# Patient Record
Sex: Female | Born: 1985
Health system: Southern US, Community
[De-identification: ages and names within clinical notes are randomized; demographics above are authoritative.]

## PROBLEM LIST (undated history)

## (undated) ENCOUNTER — Inpatient Hospital Stay (HOSPITAL_COMMUNITY): Payer: Self-pay

## (undated) DIAGNOSIS — D649 Anemia, unspecified: Secondary | ICD-10-CM

## (undated) DIAGNOSIS — M92219 Osteochondrosis (juvenile) of carpal lunate [Kienbock], unspecified hand: Secondary | ICD-10-CM

## (undated) DIAGNOSIS — J309 Allergic rhinitis, unspecified: Secondary | ICD-10-CM

## (undated) DIAGNOSIS — R519 Headache, unspecified: Secondary | ICD-10-CM

## (undated) DIAGNOSIS — K219 Gastro-esophageal reflux disease without esophagitis: Secondary | ICD-10-CM

## (undated) DIAGNOSIS — G8929 Other chronic pain: Secondary | ICD-10-CM

## (undated) DIAGNOSIS — J449 Chronic obstructive pulmonary disease, unspecified: Secondary | ICD-10-CM

## (undated) DIAGNOSIS — M549 Dorsalgia, unspecified: Secondary | ICD-10-CM

## (undated) DIAGNOSIS — I10 Essential (primary) hypertension: Secondary | ICD-10-CM

## (undated) DIAGNOSIS — R51 Headache: Secondary | ICD-10-CM

## (undated) DIAGNOSIS — A599 Trichomoniasis, unspecified: Secondary | ICD-10-CM

## (undated) DIAGNOSIS — G43909 Migraine, unspecified, not intractable, without status migrainosus: Secondary | ICD-10-CM

## (undated) HISTORY — PX: HERNIA REPAIR: SHX51

## (undated) HISTORY — DX: Gastro-esophageal reflux disease without esophagitis: K21.9

## (undated) HISTORY — DX: Morbid (severe) obesity due to excess calories: E66.01

## (undated) HISTORY — DX: Headache, unspecified: R51.9

## (undated) HISTORY — PX: TUBAL LIGATION: SHX77

## (undated) HISTORY — PX: OTHER SURGICAL HISTORY: SHX169

## (undated) HISTORY — DX: Headache: R51

## (undated) HISTORY — PX: CHOLECYSTECTOMY: SHX55

## (undated) HISTORY — PX: DILATION AND CURETTAGE OF UTERUS: SHX78

## (undated) HISTORY — PX: TOOTH EXTRACTION: SUR596

## (undated) HISTORY — DX: Allergic rhinitis, unspecified: J30.9

---
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
## 2003-01-30 ENCOUNTER — Inpatient Hospital Stay (HOSPITAL_COMMUNITY): Admission: AD | Admit: 2003-01-30 | Discharge: 2003-01-30 | Payer: Self-pay | Admitting: Obstetrics & Gynecology

## 2003-01-31 ENCOUNTER — Ambulatory Visit (HOSPITAL_COMMUNITY): Admission: RE | Admit: 2003-01-31 | Discharge: 2003-01-31 | Payer: Self-pay | Admitting: Obstetrics & Gynecology

## 2003-01-31 ENCOUNTER — Encounter: Payer: Self-pay | Admitting: Obstetrics & Gynecology

## 2003-02-02 ENCOUNTER — Inpatient Hospital Stay (HOSPITAL_COMMUNITY): Admission: AD | Admit: 2003-02-02 | Discharge: 2003-02-02 | Payer: Self-pay | Admitting: Obstetrics & Gynecology

## 2003-02-06 ENCOUNTER — Inpatient Hospital Stay (HOSPITAL_COMMUNITY): Admission: AD | Admit: 2003-02-06 | Discharge: 2003-02-09 | Payer: Self-pay | Admitting: Obstetrics & Gynecology

## 2003-02-06 ENCOUNTER — Encounter (INDEPENDENT_AMBULATORY_CARE_PROVIDER_SITE_OTHER): Payer: Self-pay | Admitting: *Deleted

## 2003-02-12 ENCOUNTER — Inpatient Hospital Stay (HOSPITAL_COMMUNITY): Admission: AD | Admit: 2003-02-12 | Discharge: 2003-02-12 | Payer: Self-pay | Admitting: Obstetrics and Gynecology

## 2003-03-02 ENCOUNTER — Emergency Department (HOSPITAL_COMMUNITY): Admission: EM | Admit: 2003-03-02 | Discharge: 2003-03-03 | Payer: Self-pay | Admitting: Emergency Medicine

## 2003-03-03 ENCOUNTER — Encounter: Payer: Self-pay | Admitting: Emergency Medicine

## 2003-03-11 ENCOUNTER — Emergency Department (HOSPITAL_COMMUNITY): Admission: EM | Admit: 2003-03-11 | Discharge: 2003-03-11 | Payer: Self-pay | Admitting: Emergency Medicine

## 2003-06-21 ENCOUNTER — Emergency Department (HOSPITAL_COMMUNITY): Admission: EM | Admit: 2003-06-21 | Discharge: 2003-06-21 | Payer: Self-pay | Admitting: Emergency Medicine

## 2003-09-24 ENCOUNTER — Emergency Department (HOSPITAL_COMMUNITY): Admission: EM | Admit: 2003-09-24 | Discharge: 2003-09-25 | Payer: Self-pay | Admitting: Emergency Medicine

## 2003-12-30 ENCOUNTER — Emergency Department (HOSPITAL_COMMUNITY): Admission: EM | Admit: 2003-12-30 | Discharge: 2003-12-30 | Payer: Self-pay | Admitting: Emergency Medicine

## 2004-01-03 ENCOUNTER — Encounter (INDEPENDENT_AMBULATORY_CARE_PROVIDER_SITE_OTHER): Payer: Self-pay | Admitting: Specialist

## 2004-01-03 ENCOUNTER — Observation Stay (HOSPITAL_COMMUNITY): Admission: RE | Admit: 2004-01-03 | Discharge: 2004-01-04 | Payer: Self-pay | Admitting: Surgery

## 2004-02-01 ENCOUNTER — Emergency Department (HOSPITAL_COMMUNITY): Admission: EM | Admit: 2004-02-01 | Discharge: 2004-02-01 | Payer: Self-pay | Admitting: Emergency Medicine

## 2004-02-08 ENCOUNTER — Ambulatory Visit (HOSPITAL_COMMUNITY): Admission: RE | Admit: 2004-02-08 | Discharge: 2004-02-08 | Payer: Self-pay | Admitting: *Deleted

## 2004-03-06 ENCOUNTER — Ambulatory Visit (HOSPITAL_COMMUNITY): Admission: RE | Admit: 2004-03-06 | Discharge: 2004-03-06 | Payer: Self-pay | Admitting: *Deleted

## 2004-03-13 ENCOUNTER — Inpatient Hospital Stay (HOSPITAL_COMMUNITY): Admission: AD | Admit: 2004-03-13 | Discharge: 2004-03-13 | Payer: Self-pay | Admitting: *Deleted

## 2004-03-14 ENCOUNTER — Inpatient Hospital Stay (HOSPITAL_COMMUNITY): Admission: AD | Admit: 2004-03-14 | Discharge: 2004-03-15 | Payer: Self-pay | Admitting: *Deleted

## 2004-03-15 ENCOUNTER — Ambulatory Visit: Payer: Self-pay | Admitting: *Deleted

## 2004-03-15 ENCOUNTER — Encounter (INDEPENDENT_AMBULATORY_CARE_PROVIDER_SITE_OTHER): Payer: Self-pay | Admitting: *Deleted

## 2004-03-15 ENCOUNTER — Ambulatory Visit (HOSPITAL_COMMUNITY): Admission: AD | Admit: 2004-03-15 | Discharge: 2004-03-15 | Payer: Self-pay | Admitting: *Deleted

## 2004-06-18 ENCOUNTER — Ambulatory Visit (HOSPITAL_COMMUNITY): Admission: RE | Admit: 2004-06-18 | Discharge: 2004-06-18 | Payer: Self-pay | Admitting: *Deleted

## 2004-08-01 ENCOUNTER — Inpatient Hospital Stay (HOSPITAL_COMMUNITY): Admission: AD | Admit: 2004-08-01 | Discharge: 2004-08-01 | Payer: Self-pay | Admitting: Obstetrics & Gynecology

## 2004-08-07 ENCOUNTER — Ambulatory Visit (HOSPITAL_COMMUNITY): Admission: RE | Admit: 2004-08-07 | Discharge: 2004-08-07 | Payer: Self-pay | Admitting: *Deleted

## 2004-09-23 ENCOUNTER — Ambulatory Visit (HOSPITAL_COMMUNITY): Admission: RE | Admit: 2004-09-23 | Discharge: 2004-09-23 | Payer: Self-pay | Admitting: *Deleted

## 2004-10-19 ENCOUNTER — Inpatient Hospital Stay (HOSPITAL_COMMUNITY): Admission: AD | Admit: 2004-10-19 | Discharge: 2004-10-19 | Payer: Self-pay | Admitting: *Deleted

## 2004-11-07 ENCOUNTER — Ambulatory Visit (HOSPITAL_COMMUNITY): Admission: RE | Admit: 2004-11-07 | Discharge: 2004-11-07 | Payer: Self-pay | Admitting: *Deleted

## 2004-11-14 ENCOUNTER — Inpatient Hospital Stay (HOSPITAL_COMMUNITY): Admission: AD | Admit: 2004-11-14 | Discharge: 2004-11-14 | Payer: Self-pay | Admitting: Family Medicine

## 2004-11-28 ENCOUNTER — Inpatient Hospital Stay (HOSPITAL_COMMUNITY): Admission: AD | Admit: 2004-11-28 | Discharge: 2004-11-28 | Payer: Self-pay | Admitting: Obstetrics & Gynecology

## 2004-12-16 ENCOUNTER — Inpatient Hospital Stay (HOSPITAL_COMMUNITY): Admission: AD | Admit: 2004-12-16 | Discharge: 2004-12-16 | Payer: Self-pay | Admitting: Obstetrics & Gynecology

## 2004-12-20 ENCOUNTER — Ambulatory Visit: Payer: Self-pay | Admitting: Obstetrics and Gynecology

## 2004-12-20 ENCOUNTER — Inpatient Hospital Stay (HOSPITAL_COMMUNITY): Admission: AD | Admit: 2004-12-20 | Discharge: 2004-12-20 | Payer: Self-pay | Admitting: Obstetrics & Gynecology

## 2004-12-24 ENCOUNTER — Inpatient Hospital Stay (HOSPITAL_COMMUNITY): Admission: AD | Admit: 2004-12-24 | Discharge: 2004-12-24 | Payer: Self-pay | Admitting: *Deleted

## 2004-12-31 ENCOUNTER — Encounter (INDEPENDENT_AMBULATORY_CARE_PROVIDER_SITE_OTHER): Payer: Self-pay | Admitting: *Deleted

## 2004-12-31 ENCOUNTER — Inpatient Hospital Stay (HOSPITAL_COMMUNITY): Admission: RE | Admit: 2004-12-31 | Discharge: 2005-01-03 | Payer: Self-pay | Admitting: *Deleted

## 2004-12-31 ENCOUNTER — Ambulatory Visit: Payer: Self-pay | Admitting: *Deleted

## 2005-01-06 ENCOUNTER — Ambulatory Visit: Payer: Self-pay | Admitting: Obstetrics & Gynecology

## 2005-02-16 ENCOUNTER — Emergency Department (HOSPITAL_COMMUNITY): Admission: EM | Admit: 2005-02-16 | Discharge: 2005-02-16 | Payer: Self-pay | Admitting: Emergency Medicine

## 2005-02-20 ENCOUNTER — Ambulatory Visit (HOSPITAL_COMMUNITY): Admission: RE | Admit: 2005-02-20 | Discharge: 2005-02-20 | Payer: Self-pay | Admitting: Neurology

## 2005-03-01 ENCOUNTER — Emergency Department (HOSPITAL_COMMUNITY): Admission: EM | Admit: 2005-03-01 | Discharge: 2005-03-02 | Payer: Self-pay | Admitting: Emergency Medicine

## 2005-03-17 ENCOUNTER — Emergency Department (HOSPITAL_COMMUNITY): Admission: EM | Admit: 2005-03-17 | Discharge: 2005-03-18 | Payer: Self-pay | Admitting: Emergency Medicine

## 2005-04-02 ENCOUNTER — Emergency Department (HOSPITAL_COMMUNITY): Admission: EM | Admit: 2005-04-02 | Discharge: 2005-04-02 | Payer: Self-pay | Admitting: Emergency Medicine

## 2005-04-21 ENCOUNTER — Emergency Department (HOSPITAL_COMMUNITY): Admission: EM | Admit: 2005-04-21 | Discharge: 2005-04-22 | Payer: Self-pay | Admitting: Emergency Medicine

## 2005-05-12 ENCOUNTER — Inpatient Hospital Stay (HOSPITAL_COMMUNITY): Admission: AD | Admit: 2005-05-12 | Discharge: 2005-05-13 | Payer: Self-pay | Admitting: Obstetrics & Gynecology

## 2005-05-21 ENCOUNTER — Emergency Department (HOSPITAL_COMMUNITY): Admission: EM | Admit: 2005-05-21 | Discharge: 2005-05-22 | Payer: Self-pay | Admitting: Emergency Medicine

## 2005-07-18 ENCOUNTER — Emergency Department (HOSPITAL_COMMUNITY): Admission: EM | Admit: 2005-07-18 | Discharge: 2005-07-18 | Payer: Self-pay | Admitting: Emergency Medicine

## 2005-09-26 ENCOUNTER — Emergency Department (HOSPITAL_COMMUNITY): Admission: EM | Admit: 2005-09-26 | Discharge: 2005-09-26 | Payer: Self-pay | Admitting: Emergency Medicine

## 2005-09-28 ENCOUNTER — Emergency Department (HOSPITAL_COMMUNITY): Admission: EM | Admit: 2005-09-28 | Discharge: 2005-09-28 | Payer: Self-pay | Admitting: Emergency Medicine

## 2005-10-18 ENCOUNTER — Emergency Department (HOSPITAL_COMMUNITY): Admission: EM | Admit: 2005-10-18 | Discharge: 2005-10-19 | Payer: Self-pay | Admitting: Emergency Medicine

## 2005-11-06 ENCOUNTER — Inpatient Hospital Stay (HOSPITAL_COMMUNITY): Admission: AD | Admit: 2005-11-06 | Discharge: 2005-11-07 | Payer: Self-pay | Admitting: Family Medicine

## 2005-11-07 ENCOUNTER — Inpatient Hospital Stay (HOSPITAL_COMMUNITY): Admission: AD | Admit: 2005-11-07 | Discharge: 2005-11-07 | Payer: Self-pay | Admitting: Obstetrics & Gynecology

## 2005-11-30 ENCOUNTER — Emergency Department (HOSPITAL_COMMUNITY): Admission: EM | Admit: 2005-11-30 | Discharge: 2005-11-30 | Payer: Self-pay | Admitting: Emergency Medicine

## 2005-12-04 ENCOUNTER — Encounter: Admission: RE | Admit: 2005-12-04 | Discharge: 2005-12-04 | Payer: Self-pay | Admitting: Gastroenterology

## 2005-12-19 ENCOUNTER — Emergency Department (HOSPITAL_COMMUNITY): Admission: EM | Admit: 2005-12-19 | Discharge: 2005-12-19 | Payer: Self-pay | Admitting: Emergency Medicine

## 2005-12-22 ENCOUNTER — Encounter: Admission: RE | Admit: 2005-12-22 | Discharge: 2005-12-22 | Payer: Self-pay | Admitting: Gastroenterology

## 2006-02-11 ENCOUNTER — Emergency Department (HOSPITAL_COMMUNITY): Admission: EM | Admit: 2006-02-11 | Discharge: 2006-02-12 | Payer: Self-pay | Admitting: Emergency Medicine

## 2006-03-09 ENCOUNTER — Emergency Department (HOSPITAL_COMMUNITY): Admission: EM | Admit: 2006-03-09 | Discharge: 2006-03-10 | Payer: Self-pay | Admitting: Emergency Medicine

## 2006-03-15 ENCOUNTER — Inpatient Hospital Stay (HOSPITAL_COMMUNITY): Admission: AD | Admit: 2006-03-15 | Discharge: 2006-03-15 | Payer: Self-pay | Admitting: Obstetrics and Gynecology

## 2006-03-26 ENCOUNTER — Encounter: Payer: Self-pay | Admitting: Obstetrics and Gynecology

## 2006-03-26 ENCOUNTER — Ambulatory Visit: Payer: Self-pay | Admitting: Obstetrics and Gynecology

## 2006-04-02 ENCOUNTER — Ambulatory Visit: Payer: Self-pay | Admitting: Obstetrics and Gynecology

## 2006-04-02 ENCOUNTER — Ambulatory Visit (HOSPITAL_COMMUNITY): Admission: RE | Admit: 2006-04-02 | Discharge: 2006-04-02 | Payer: Self-pay | Admitting: Obstetrics and Gynecology

## 2006-04-06 ENCOUNTER — Inpatient Hospital Stay (HOSPITAL_COMMUNITY): Admission: AD | Admit: 2006-04-06 | Discharge: 2006-04-06 | Payer: Self-pay | Admitting: Gynecology

## 2006-04-15 ENCOUNTER — Emergency Department (HOSPITAL_COMMUNITY): Admission: EM | Admit: 2006-04-15 | Discharge: 2006-04-16 | Payer: Self-pay | Admitting: Emergency Medicine

## 2006-04-22 ENCOUNTER — Ambulatory Visit: Payer: Self-pay | Admitting: Obstetrics and Gynecology

## 2006-05-31 ENCOUNTER — Emergency Department (HOSPITAL_COMMUNITY): Admission: EM | Admit: 2006-05-31 | Discharge: 2006-05-31 | Payer: Self-pay | Admitting: Emergency Medicine

## 2006-06-08 ENCOUNTER — Emergency Department (HOSPITAL_COMMUNITY): Admission: EM | Admit: 2006-06-08 | Discharge: 2006-06-09 | Payer: Self-pay | Admitting: Emergency Medicine

## 2006-06-13 ENCOUNTER — Inpatient Hospital Stay (HOSPITAL_COMMUNITY): Admission: AD | Admit: 2006-06-13 | Discharge: 2006-06-13 | Payer: Self-pay | Admitting: Obstetrics & Gynecology

## 2006-06-16 ENCOUNTER — Inpatient Hospital Stay (HOSPITAL_COMMUNITY): Admission: AD | Admit: 2006-06-16 | Discharge: 2006-06-16 | Payer: Self-pay | Admitting: Family Medicine

## 2006-06-24 ENCOUNTER — Inpatient Hospital Stay (HOSPITAL_COMMUNITY): Admission: RE | Admit: 2006-06-24 | Discharge: 2006-06-24 | Payer: Self-pay | Admitting: Family Medicine

## 2006-07-15 ENCOUNTER — Inpatient Hospital Stay (HOSPITAL_COMMUNITY): Admission: AD | Admit: 2006-07-15 | Discharge: 2006-07-16 | Payer: Self-pay | Admitting: Gynecology

## 2006-07-19 ENCOUNTER — Emergency Department (HOSPITAL_COMMUNITY): Admission: EM | Admit: 2006-07-19 | Discharge: 2006-07-20 | Payer: Self-pay | Admitting: Emergency Medicine

## 2006-07-22 ENCOUNTER — Emergency Department (HOSPITAL_COMMUNITY): Admission: EM | Admit: 2006-07-22 | Discharge: 2006-07-23 | Payer: Self-pay | Admitting: Emergency Medicine

## 2006-07-24 ENCOUNTER — Inpatient Hospital Stay (HOSPITAL_COMMUNITY): Admission: AD | Admit: 2006-07-24 | Discharge: 2006-07-24 | Payer: Self-pay | Admitting: Gynecology

## 2006-08-12 ENCOUNTER — Inpatient Hospital Stay (HOSPITAL_COMMUNITY): Admission: AD | Admit: 2006-08-12 | Discharge: 2006-08-13 | Payer: Self-pay | Admitting: Gynecology

## 2006-08-25 ENCOUNTER — Inpatient Hospital Stay (HOSPITAL_COMMUNITY): Admission: AD | Admit: 2006-08-25 | Discharge: 2006-08-26 | Payer: Self-pay | Admitting: Obstetrics & Gynecology

## 2006-08-30 ENCOUNTER — Emergency Department (HOSPITAL_COMMUNITY): Admission: EM | Admit: 2006-08-30 | Discharge: 2006-08-31 | Payer: Self-pay | Admitting: Emergency Medicine

## 2006-09-10 ENCOUNTER — Ambulatory Visit (HOSPITAL_COMMUNITY): Admission: RE | Admit: 2006-09-10 | Discharge: 2006-09-10 | Payer: Self-pay | Admitting: Obstetrics & Gynecology

## 2006-09-21 ENCOUNTER — Ambulatory Visit: Payer: Self-pay | Admitting: Obstetrics & Gynecology

## 2006-09-24 ENCOUNTER — Ambulatory Visit (HOSPITAL_COMMUNITY): Admission: RE | Admit: 2006-09-24 | Discharge: 2006-09-24 | Payer: Self-pay | Admitting: Obstetrics & Gynecology

## 2006-09-28 ENCOUNTER — Inpatient Hospital Stay (HOSPITAL_COMMUNITY): Admission: AD | Admit: 2006-09-28 | Discharge: 2006-09-28 | Payer: Self-pay | Admitting: Obstetrics & Gynecology

## 2006-09-28 ENCOUNTER — Ambulatory Visit: Payer: Self-pay | Admitting: Obstetrics & Gynecology

## 2006-09-28 ENCOUNTER — Ambulatory Visit: Payer: Self-pay | Admitting: Obstetrics and Gynecology

## 2006-10-01 ENCOUNTER — Ambulatory Visit: Payer: Self-pay | Admitting: Obstetrics & Gynecology

## 2006-10-02 ENCOUNTER — Ambulatory Visit: Payer: Self-pay | Admitting: Obstetrics & Gynecology

## 2006-10-02 ENCOUNTER — Observation Stay (HOSPITAL_COMMUNITY): Admission: AD | Admit: 2006-10-02 | Discharge: 2006-10-03 | Payer: Self-pay | Admitting: Obstetrics & Gynecology

## 2006-10-05 ENCOUNTER — Ambulatory Visit: Payer: Self-pay | Admitting: Obstetrics & Gynecology

## 2006-10-12 ENCOUNTER — Encounter: Admission: RE | Admit: 2006-10-12 | Discharge: 2007-01-10 | Payer: Self-pay | Admitting: Obstetrics & Gynecology

## 2006-10-12 ENCOUNTER — Ambulatory Visit: Payer: Self-pay | Admitting: *Deleted

## 2006-10-16 ENCOUNTER — Ambulatory Visit: Payer: Self-pay | Admitting: *Deleted

## 2006-10-16 ENCOUNTER — Inpatient Hospital Stay (HOSPITAL_COMMUNITY): Admission: AD | Admit: 2006-10-16 | Discharge: 2006-10-16 | Payer: Self-pay | Admitting: Obstetrics and Gynecology

## 2006-10-19 ENCOUNTER — Ambulatory Visit: Payer: Self-pay | Admitting: Obstetrics & Gynecology

## 2006-10-22 ENCOUNTER — Ambulatory Visit: Payer: Self-pay | Admitting: Family Medicine

## 2006-10-22 ENCOUNTER — Ambulatory Visit (HOSPITAL_COMMUNITY): Admission: RE | Admit: 2006-10-22 | Discharge: 2006-10-22 | Payer: Self-pay | Admitting: Obstetrics & Gynecology

## 2006-10-26 ENCOUNTER — Ambulatory Visit: Payer: Self-pay | Admitting: Obstetrics & Gynecology

## 2006-10-28 ENCOUNTER — Ambulatory Visit: Payer: Self-pay | Admitting: Gynecology

## 2006-11-02 ENCOUNTER — Ambulatory Visit: Payer: Self-pay | Admitting: Obstetrics & Gynecology

## 2006-11-05 ENCOUNTER — Ambulatory Visit: Payer: Self-pay | Admitting: Family Medicine

## 2006-11-16 ENCOUNTER — Ambulatory Visit: Payer: Self-pay | Admitting: Obstetrics & Gynecology

## 2006-11-23 ENCOUNTER — Ambulatory Visit: Payer: Self-pay | Admitting: Obstetrics & Gynecology

## 2006-11-30 ENCOUNTER — Ambulatory Visit (HOSPITAL_COMMUNITY): Admission: RE | Admit: 2006-11-30 | Discharge: 2006-11-30 | Payer: Self-pay | Admitting: Obstetrics & Gynecology

## 2006-11-30 ENCOUNTER — Ambulatory Visit: Payer: Self-pay | Admitting: *Deleted

## 2006-12-09 ENCOUNTER — Ambulatory Visit: Payer: Self-pay | Admitting: Obstetrics & Gynecology

## 2006-12-16 ENCOUNTER — Inpatient Hospital Stay (HOSPITAL_COMMUNITY): Admission: AD | Admit: 2006-12-16 | Discharge: 2006-12-17 | Payer: Self-pay | Admitting: Obstetrics and Gynecology

## 2006-12-16 ENCOUNTER — Ambulatory Visit: Payer: Self-pay | Admitting: Obstetrics and Gynecology

## 2006-12-30 ENCOUNTER — Ambulatory Visit (HOSPITAL_COMMUNITY): Admission: RE | Admit: 2006-12-30 | Discharge: 2006-12-30 | Payer: Self-pay | Admitting: Obstetrics & Gynecology

## 2007-01-04 ENCOUNTER — Ambulatory Visit: Payer: Self-pay | Admitting: Family Medicine

## 2007-01-05 ENCOUNTER — Ambulatory Visit: Payer: Self-pay | Admitting: Physician Assistant

## 2007-01-05 ENCOUNTER — Inpatient Hospital Stay (HOSPITAL_COMMUNITY): Admission: AD | Admit: 2007-01-05 | Discharge: 2007-01-06 | Payer: Self-pay | Admitting: Family Medicine

## 2007-01-08 ENCOUNTER — Ambulatory Visit (HOSPITAL_COMMUNITY): Admission: RE | Admit: 2007-01-08 | Discharge: 2007-01-08 | Payer: Self-pay | Admitting: Obstetrics & Gynecology

## 2007-01-10 ENCOUNTER — Inpatient Hospital Stay (HOSPITAL_COMMUNITY): Admission: AD | Admit: 2007-01-10 | Discharge: 2007-01-10 | Payer: Self-pay | Admitting: Obstetrics and Gynecology

## 2007-01-10 ENCOUNTER — Ambulatory Visit: Payer: Self-pay | Admitting: Obstetrics and Gynecology

## 2007-01-11 ENCOUNTER — Ambulatory Visit: Payer: Self-pay | Admitting: Obstetrics & Gynecology

## 2007-01-14 ENCOUNTER — Ambulatory Visit: Payer: Self-pay | Admitting: Gynecology

## 2007-01-14 ENCOUNTER — Inpatient Hospital Stay (HOSPITAL_COMMUNITY): Admission: AD | Admit: 2007-01-14 | Discharge: 2007-01-14 | Payer: Self-pay | Admitting: Gynecology

## 2007-01-14 ENCOUNTER — Encounter: Payer: Self-pay | Admitting: *Deleted

## 2007-01-14 ENCOUNTER — Ambulatory Visit: Payer: Self-pay | Admitting: Obstetrics and Gynecology

## 2007-01-18 ENCOUNTER — Ambulatory Visit: Payer: Self-pay | Admitting: Gynecology

## 2007-01-18 ENCOUNTER — Inpatient Hospital Stay (HOSPITAL_COMMUNITY): Admission: AD | Admit: 2007-01-18 | Discharge: 2007-01-20 | Payer: Self-pay | Admitting: Obstetrics & Gynecology

## 2007-01-18 ENCOUNTER — Encounter (INDEPENDENT_AMBULATORY_CARE_PROVIDER_SITE_OTHER): Payer: Self-pay | Admitting: Gynecology

## 2007-01-24 ENCOUNTER — Ambulatory Visit: Payer: Self-pay | Admitting: Obstetrics and Gynecology

## 2007-01-24 ENCOUNTER — Inpatient Hospital Stay (HOSPITAL_COMMUNITY): Admission: AD | Admit: 2007-01-24 | Discharge: 2007-01-24 | Payer: Self-pay | Admitting: Obstetrics and Gynecology

## 2007-01-25 ENCOUNTER — Ambulatory Visit: Payer: Self-pay | Admitting: Physician Assistant

## 2007-01-25 ENCOUNTER — Inpatient Hospital Stay (HOSPITAL_COMMUNITY): Admission: AD | Admit: 2007-01-25 | Discharge: 2007-01-26 | Payer: Self-pay | Admitting: Obstetrics and Gynecology

## 2007-01-27 ENCOUNTER — Ambulatory Visit: Payer: Self-pay | Admitting: Obstetrics & Gynecology

## 2007-01-31 ENCOUNTER — Inpatient Hospital Stay (HOSPITAL_COMMUNITY): Admission: AD | Admit: 2007-01-31 | Discharge: 2007-02-01 | Payer: Self-pay | Admitting: Obstetrics & Gynecology

## 2007-01-31 ENCOUNTER — Ambulatory Visit: Payer: Self-pay | Admitting: *Deleted

## 2007-02-05 ENCOUNTER — Inpatient Hospital Stay (HOSPITAL_COMMUNITY): Admission: AD | Admit: 2007-02-05 | Discharge: 2007-02-05 | Payer: Self-pay | Admitting: Family Medicine

## 2007-02-10 ENCOUNTER — Ambulatory Visit: Payer: Self-pay | Admitting: Gynecology

## 2007-02-12 ENCOUNTER — Ambulatory Visit (HOSPITAL_COMMUNITY): Admission: RE | Admit: 2007-02-12 | Discharge: 2007-02-12 | Payer: Self-pay | Admitting: Gynecology

## 2007-04-17 ENCOUNTER — Emergency Department (HOSPITAL_COMMUNITY): Admission: EM | Admit: 2007-04-17 | Discharge: 2007-04-17 | Payer: Self-pay | Admitting: Emergency Medicine

## 2007-05-08 ENCOUNTER — Emergency Department (HOSPITAL_COMMUNITY): Admission: EM | Admit: 2007-05-08 | Discharge: 2007-05-08 | Payer: Self-pay | Admitting: Emergency Medicine

## 2007-06-23 ENCOUNTER — Emergency Department (HOSPITAL_COMMUNITY): Admission: EM | Admit: 2007-06-23 | Discharge: 2007-06-23 | Payer: Self-pay | Admitting: Emergency Medicine

## 2007-07-10 ENCOUNTER — Inpatient Hospital Stay (HOSPITAL_COMMUNITY): Admission: AD | Admit: 2007-07-10 | Discharge: 2007-07-11 | Payer: Self-pay | Admitting: Obstetrics and Gynecology

## 2007-07-11 ENCOUNTER — Emergency Department (HOSPITAL_COMMUNITY): Admission: EM | Admit: 2007-07-11 | Discharge: 2007-07-12 | Payer: Self-pay | Admitting: Emergency Medicine

## 2007-07-15 HISTORY — PX: WRIST SURGERY: SHX841

## 2007-07-25 ENCOUNTER — Emergency Department (HOSPITAL_COMMUNITY): Admission: EM | Admit: 2007-07-25 | Discharge: 2007-07-25 | Payer: Self-pay | Admitting: Emergency Medicine

## 2007-08-11 ENCOUNTER — Emergency Department (HOSPITAL_COMMUNITY): Admission: EM | Admit: 2007-08-11 | Discharge: 2007-08-11 | Payer: Self-pay | Admitting: Emergency Medicine

## 2007-08-13 ENCOUNTER — Emergency Department (HOSPITAL_COMMUNITY): Admission: EM | Admit: 2007-08-13 | Discharge: 2007-08-13 | Payer: Self-pay | Admitting: Emergency Medicine

## 2007-08-19 ENCOUNTER — Emergency Department (HOSPITAL_COMMUNITY): Admission: EM | Admit: 2007-08-19 | Discharge: 2007-08-20 | Payer: Self-pay | Admitting: Emergency Medicine

## 2007-08-29 ENCOUNTER — Emergency Department (HOSPITAL_COMMUNITY): Admission: EM | Admit: 2007-08-29 | Discharge: 2007-08-30 | Payer: Self-pay | Admitting: Emergency Medicine

## 2007-09-01 ENCOUNTER — Emergency Department (HOSPITAL_COMMUNITY): Admission: EM | Admit: 2007-09-01 | Discharge: 2007-09-01 | Payer: Self-pay | Admitting: Emergency Medicine

## 2007-09-21 ENCOUNTER — Emergency Department (HOSPITAL_COMMUNITY): Admission: EM | Admit: 2007-09-21 | Discharge: 2007-09-21 | Payer: Self-pay | Admitting: Emergency Medicine

## 2007-09-27 ENCOUNTER — Emergency Department (HOSPITAL_COMMUNITY): Admission: EM | Admit: 2007-09-27 | Discharge: 2007-09-27 | Payer: Self-pay | Admitting: Emergency Medicine

## 2007-10-13 ENCOUNTER — Inpatient Hospital Stay (HOSPITAL_COMMUNITY): Admission: AD | Admit: 2007-10-13 | Discharge: 2007-10-13 | Payer: Self-pay | Admitting: Obstetrics & Gynecology

## 2007-10-30 ENCOUNTER — Emergency Department (HOSPITAL_COMMUNITY): Admission: EM | Admit: 2007-10-30 | Discharge: 2007-10-30 | Payer: Self-pay | Admitting: Emergency Medicine

## 2008-02-13 ENCOUNTER — Inpatient Hospital Stay (HOSPITAL_COMMUNITY): Admission: AD | Admit: 2008-02-13 | Discharge: 2008-02-13 | Payer: Self-pay | Admitting: Obstetrics and Gynecology

## 2008-02-15 ENCOUNTER — Inpatient Hospital Stay (HOSPITAL_COMMUNITY): Admission: AD | Admit: 2008-02-15 | Discharge: 2008-02-15 | Payer: Self-pay | Admitting: Obstetrics & Gynecology

## 2008-03-04 ENCOUNTER — Emergency Department (HOSPITAL_COMMUNITY): Admission: EM | Admit: 2008-03-04 | Discharge: 2008-03-04 | Payer: Self-pay | Admitting: Emergency Medicine

## 2008-03-16 ENCOUNTER — Emergency Department (HOSPITAL_COMMUNITY): Admission: EM | Admit: 2008-03-16 | Discharge: 2008-03-16 | Payer: Self-pay | Admitting: Emergency Medicine

## 2008-08-21 ENCOUNTER — Emergency Department (HOSPITAL_COMMUNITY): Admission: EM | Admit: 2008-08-21 | Discharge: 2008-08-21 | Payer: Self-pay | Admitting: Emergency Medicine

## 2008-09-07 ENCOUNTER — Emergency Department (HOSPITAL_COMMUNITY): Admission: EM | Admit: 2008-09-07 | Discharge: 2008-09-08 | Payer: Self-pay | Admitting: Emergency Medicine

## 2008-09-15 ENCOUNTER — Emergency Department (HOSPITAL_COMMUNITY): Admission: EM | Admit: 2008-09-15 | Discharge: 2008-09-16 | Payer: Self-pay | Admitting: Emergency Medicine

## 2008-09-21 ENCOUNTER — Emergency Department (HOSPITAL_COMMUNITY): Admission: EM | Admit: 2008-09-21 | Discharge: 2008-09-21 | Payer: Self-pay | Admitting: Emergency Medicine

## 2008-09-29 ENCOUNTER — Emergency Department (HOSPITAL_COMMUNITY): Admission: EM | Admit: 2008-09-29 | Discharge: 2008-09-29 | Payer: Self-pay | Admitting: Pediatrics

## 2009-01-04 ENCOUNTER — Inpatient Hospital Stay (HOSPITAL_COMMUNITY): Admission: AD | Admit: 2009-01-04 | Discharge: 2009-01-04 | Payer: Self-pay | Admitting: Obstetrics & Gynecology

## 2009-03-20 ENCOUNTER — Encounter: Admission: RE | Admit: 2009-03-20 | Discharge: 2009-04-27 | Payer: Self-pay | Admitting: Orthopedic Surgery

## 2009-06-19 ENCOUNTER — Emergency Department (HOSPITAL_COMMUNITY): Admission: EM | Admit: 2009-06-19 | Discharge: 2009-06-19 | Payer: Self-pay | Admitting: Emergency Medicine

## 2009-07-11 ENCOUNTER — Emergency Department (HOSPITAL_COMMUNITY): Admission: EM | Admit: 2009-07-11 | Discharge: 2009-07-12 | Payer: Self-pay | Admitting: Emergency Medicine

## 2009-07-12 ENCOUNTER — Emergency Department (HOSPITAL_COMMUNITY): Admission: EM | Admit: 2009-07-12 | Discharge: 2009-07-13 | Payer: Self-pay | Admitting: Emergency Medicine

## 2009-07-23 ENCOUNTER — Ambulatory Visit (HOSPITAL_COMMUNITY): Admission: RE | Admit: 2009-07-23 | Discharge: 2009-07-25 | Payer: Self-pay | Admitting: General Surgery

## 2009-08-27 ENCOUNTER — Encounter: Admission: RE | Admit: 2009-08-27 | Discharge: 2009-08-27 | Payer: Self-pay | Admitting: General Surgery

## 2009-09-10 ENCOUNTER — Encounter: Admission: RE | Admit: 2009-09-10 | Discharge: 2009-09-10 | Payer: Self-pay | Admitting: General Surgery

## 2009-09-22 ENCOUNTER — Ambulatory Visit: Payer: Self-pay | Admitting: Diagnostic Radiology

## 2009-09-22 ENCOUNTER — Emergency Department (HOSPITAL_BASED_OUTPATIENT_CLINIC_OR_DEPARTMENT_OTHER): Admission: EM | Admit: 2009-09-22 | Discharge: 2009-09-22 | Payer: Self-pay | Admitting: Emergency Medicine

## 2009-09-22 ENCOUNTER — Emergency Department (HOSPITAL_COMMUNITY): Admission: EM | Admit: 2009-09-22 | Discharge: 2009-09-22 | Payer: Self-pay | Admitting: Emergency Medicine

## 2009-10-05 ENCOUNTER — Emergency Department (HOSPITAL_COMMUNITY): Admission: EM | Admit: 2009-10-05 | Discharge: 2009-10-05 | Payer: Self-pay | Admitting: Emergency Medicine

## 2009-11-07 ENCOUNTER — Emergency Department (HOSPITAL_COMMUNITY): Admission: EM | Admit: 2009-11-07 | Discharge: 2009-11-07 | Payer: Self-pay | Admitting: Family Medicine

## 2009-11-10 ENCOUNTER — Emergency Department (HOSPITAL_COMMUNITY): Admission: EM | Admit: 2009-11-10 | Discharge: 2009-11-11 | Payer: Self-pay | Admitting: Emergency Medicine

## 2009-11-10 ENCOUNTER — Emergency Department (HOSPITAL_COMMUNITY): Admission: EM | Admit: 2009-11-10 | Discharge: 2009-11-10 | Payer: Self-pay | Admitting: Emergency Medicine

## 2009-12-26 ENCOUNTER — Encounter: Admission: RE | Admit: 2009-12-26 | Discharge: 2009-12-26 | Payer: Self-pay | Admitting: Sports Medicine

## 2010-02-07 ENCOUNTER — Emergency Department (HOSPITAL_COMMUNITY): Admission: EM | Admit: 2010-02-07 | Discharge: 2010-02-07 | Payer: Self-pay | Admitting: Family Medicine

## 2010-06-20 ENCOUNTER — Emergency Department (HOSPITAL_COMMUNITY): Admission: EM | Admit: 2010-06-20 | Discharge: 2009-10-21 | Payer: Self-pay | Admitting: Emergency Medicine

## 2010-08-04 ENCOUNTER — Encounter: Payer: Self-pay | Admitting: Sports Medicine

## 2010-08-04 ENCOUNTER — Encounter: Payer: Self-pay | Admitting: *Deleted

## 2010-08-04 ENCOUNTER — Encounter: Payer: Self-pay | Admitting: Gastroenterology

## 2010-08-04 ENCOUNTER — Encounter: Payer: Self-pay | Admitting: Neurology

## 2010-09-29 LAB — APTT: aPTT: 27 seconds (ref 24–37)

## 2010-09-29 LAB — CBC
Hemoglobin: 11.7 g/dL — ABNORMAL LOW (ref 12.0–15.0)
MCHC: 33.7 g/dL (ref 30.0–36.0)
RDW: 12.8 % (ref 11.5–15.5)

## 2010-10-14 LAB — CBC
HCT: 45.5 % (ref 36.0–46.0)
Hemoglobin: 16 g/dL — ABNORMAL HIGH (ref 12.0–15.0)
MCHC: 34.9 g/dL (ref 30.0–36.0)
Platelets: 162 10*3/uL (ref 150–400)
RDW: 12.3 % (ref 11.5–15.5)
WBC: 10.6 10*3/uL — ABNORMAL HIGH (ref 4.0–10.5)

## 2010-10-14 LAB — DIFFERENTIAL
Basophils Absolute: 0.1 10*3/uL (ref 0.0–0.1)
Basophils Relative: 1 % (ref 0–1)
Eosinophils Relative: 3 % (ref 0–5)
Lymphocytes Relative: 28 % (ref 12–46)
Lymphs Abs: 3 10*3/uL (ref 0.7–4.0)
Neutro Abs: 5.6 10*3/uL (ref 1.7–7.7)
Neutro Abs: 6.7 10*3/uL (ref 1.7–7.7)
Neutrophils Relative %: 54 % (ref 43–77)

## 2010-10-14 LAB — BASIC METABOLIC PANEL
BUN: 5 mg/dL — ABNORMAL LOW (ref 6–23)
CO2: 26 mEq/L (ref 19–32)
Calcium: 9.1 mg/dL (ref 8.4–10.5)
Creatinine, Ser: 0.75 mg/dL (ref 0.4–1.2)
GFR calc non Af Amer: >60 mL/min (ref >60)
Glucose, Bld: 99 mg/dL (ref 70–99)
Potassium: 3.5 mEq/L (ref 3.5–5.1)
Sodium: 136 mEq/L (ref 135–145)

## 2010-10-14 LAB — URINALYSIS, ROUTINE W REFLEX MICROSCOPIC
Bilirubin Urine: NEGATIVE
Hgb urine dipstick: NEGATIVE
Protein, ur: NEGATIVE mg/dL
Urobilinogen, UA: 0.2 mg/dL (ref 0.0–1.0)

## 2010-10-14 LAB — PROTIME-INR: Prothrombin Time: 13.4 seconds (ref 11.6–15.2)

## 2010-10-15 LAB — POCT I-STAT, CHEM 8
Chloride: 106 mEq/L (ref 96–112)
Glucose, Bld: 98 mg/dL (ref 70–99)
HCT: 43 % (ref 36.0–46.0)
Potassium: 3.9 mEq/L (ref 3.5–5.1)
Sodium: 139 mEq/L (ref 135–145)

## 2010-10-21 LAB — WET PREP, GENITAL
Clue Cells Wet Prep HPF POC: NONE SEEN
Trich, Wet Prep: NONE SEEN
Yeast Wet Prep HPF POC: NONE SEEN

## 2010-10-21 LAB — DIFFERENTIAL
Eosinophils Relative: 4 % (ref 0–5)
Lymphocytes Relative: 26 % (ref 12–46)
Lymphs Abs: 2.5 10*3/uL (ref 0.7–4.0)
Monocytes Absolute: 0.4 10*3/uL (ref 0.1–1.0)
Monocytes Relative: 4 % (ref 3–12)
Neutro Abs: 6.2 10*3/uL (ref 1.7–7.7)

## 2010-10-21 LAB — URINALYSIS, ROUTINE W REFLEX MICROSCOPIC
Ketones, ur: NEGATIVE mg/dL
Leukocytes, UA: NEGATIVE
Protein, ur: NEGATIVE mg/dL
Urobilinogen, UA: 0.2 mg/dL (ref 0.0–1.0)

## 2010-10-21 LAB — CBC
HCT: 41.7 % (ref 36.0–46.0)
Hemoglobin: 14.7 g/dL (ref 12.0–15.0)
RBC: 4.68 MIL/uL (ref 3.87–5.11)
RDW: 12.4 % (ref 11.5–15.5)
WBC: 9.6 10*3/uL (ref 4.0–10.5)

## 2010-10-21 LAB — POCT PREGNANCY, URINE: Preg Test, Ur: NEGATIVE

## 2010-10-21 LAB — URINE MICROSCOPIC-ADD ON

## 2010-10-31 ENCOUNTER — Inpatient Hospital Stay (HOSPITAL_COMMUNITY): Payer: Medicaid Other

## 2010-10-31 ENCOUNTER — Inpatient Hospital Stay (HOSPITAL_COMMUNITY)
Admission: AD | Admit: 2010-10-31 | Discharge: 2010-10-31 | Disposition: A | Discharge status: Home / Self Care | Payer: Medicaid Other | Source: Ambulatory Visit | Attending: Obstetrics & Gynecology | Admitting: Obstetrics & Gynecology

## 2010-10-31 DIAGNOSIS — R109 Unspecified abdominal pain: Secondary | ICD-10-CM | POA: Insufficient documentation

## 2010-10-31 LAB — URINALYSIS, ROUTINE W REFLEX MICROSCOPIC
Bilirubin Urine: NEGATIVE
Hgb urine dipstick: NEGATIVE
Nitrite: NEGATIVE
Specific Gravity, Urine: 1.015 (ref 1.005–1.030)
Urobilinogen, UA: 0.2 mg/dL (ref 0.0–1.0)
pH: 6 (ref 5.0–8.0)

## 2010-10-31 LAB — WET PREP, GENITAL: Clue Cells Wet Prep HPF POC: NONE SEEN

## 2010-10-31 LAB — POCT PREGNANCY, URINE: Preg Test, Ur: NEGATIVE

## 2010-11-01 LAB — GC/CHLAMYDIA PROBE AMP, GENITAL: Chlamydia, DNA Probe: NEGATIVE

## 2010-11-26 NOTE — Op Note (Signed)
NAME:  [name], HAREWOOD NO.:  [id number]   MEDICAL RECORD NO.:  [id number]          PATIENT TYPE:  INP   LOCATION:                                [facility]  [facility]   PHYSICIAN:  Ginger Carne, MD  DATE OF BIRTH:  1986/06/05   DATE OF PROCEDURE:  01/18/2007  DATE OF DISCHARGE:                               OPERATIVE REPORT   PREOPERATIVE DIAGNOSIS:  37-week gestation, uncontrolled A2 diabetic, [redacted]  weeks gestation and sterilization request, previous cesarean section.   POSTOPERATIVE DIAGNOSIS:  37-week gestation, uncontrolled A2 diabetic,  [redacted] weeks gestation and sterilization request, previous cesarean section,  preterm viable delivery of female infant.   PROCEDURE:  Repeat low transverse cesarean section and Pomeroy bilateral  tubal ligation.   SURGEON:  Ginger Carne, MD   ASSISTANT:  Dr. Okey Dupre.   ESTIMATED BLOOD LOSS:  500 mL.   COMPLICATIONS:  None immediate.   ANESTHESIA:  Spinal.   SPECIMEN:  Portion of the right and left tubes sent separately and cord  bloods.   OPERATIVE FINDINGS:  There is a term infant female delivered in vertex  presentation.  Apgar and weight per delivery room record, no gross  abnormalities.  Baby cried spontaneously at delivery.  Placenta was  complete, three-vessel cord central insertion.  Uterus, tubes and  ovaries showed normal decidual changes of pregnancy.  Amniotic fluid was  clear, non foul-smelling.   PROCEDURE:  The patient prepped and draped in usual fashion and placed  in left lateral supine position.  Betadine solution used for antiseptic  and the patient was catheterized prior to procedure.  After adequate  spinal analgesia a low vertical infraumbilical incision was made and the  abdomen opened.  Bladder flap dissected and lower uterine segment  incised transversely.  Baby delivered, cord clamped and cut and infant  given to the pediatric staff after bulb suctioning.  Placenta removed  manually.  Uterus  inspected.  Closure of the uterine musculature in one  layers of Vicryl running interlocking suture.  Bilateral Pomeroy,  bilateral tubal ligation performed by grasping the isthmus ampullary  junction of both tubes.  2-3 cm of tube were incorporated and 2-0 plain  catgut ties utilized twice.  The tubes were cut above said knots and  cauterized, no active bleeding noted.  Specimens sent separately to  pathology.  Following this, no bleeding noted.  Bleeding points  hemostatically checked.  Blood clots removed.  Closure of the  fascia in one layer with double loop zero PDS running suture and skin  staples for the skin.  Instrument, and sponge count were correct.  The  patient tolerated the procedure well, returned post anesthesia recovery  room in excellent condition.      Ginger Carne, MD  Electronically Signed     SHB/MEDQ  D:  01/18/2007  T:  01/18/2007  Job:  [id number]

## 2010-11-26 NOTE — Discharge Summary (Signed)
NAME:  [name], [name]      ACCOUNT NO.:  [id number]   MEDICAL RECORD NO.:  [id number]          PATIENT TYPE:  INP   LOCATION:  9374                          [facility]ACILITY:  [facility]   PHYSICIAN:  Phil D. Okey Dupre, M.D.     DATE OF BIRTH:  Oct 29, 1985   DATE OF ADMISSION:  12/16/2006  DATE OF DISCHARGE:  12/17/2006                               DISCHARGE SUMMARY   DISCHARGE DIAGNOSES:  1. Asthma exacerbation.  2. Viral pneumonia.  3. Tobacco dependence.  4. Gestational diabetes type A2.  5. Migraine headaches.   PROCEDURES:  1. Fetal echocardiogram.  2. Chest x-ray showed viral pneumonia.  3. A complete OB ultrasound was performed.   CONSULTS:  Maternal fetal medicine was consulted as well as Dr. Mayer Camel  with pediatric cardiology.   DISCHARGE MEDICATIONS:  1. Albuterol 1 to 2 puffs inhaled every 4 hours as needed for      shortness of breath.  2. Insulin regimen as follows:  26 units of regular insulin and 48      units of NPH in the morning with breakfast, followed by 20 units of      regular insulin with dinner and 24 units of NPH insulin at bedtime.  3. Azithromycin 500 mg by mouth daily.  4. Prenatal vitamins 1 tablet by mouth daily.  5. Glyburide 2.5 mg by mouth each night.   FOLLOWUP INSTRUCTIONS:  The patient is leaving the hospital against  medical advice. The risks of this had been discussed with the patient  including fetal death. She reports that she has an appointment with her  obstetrician doctor tomorrow, and plans to go to that clinic if she can  get a ride.   HOSPITAL COURSE:  1. This is a 25 year old female who is gravida 6, para 3-0-2-3 who      presented to triage with an intrauterine pregnancy at 32 weeks and      3 days. She reported fever, chills and cough as well as shortness      of breath. Her respiratory rate was 32, and she was saturating 97%      on room air. She reports that she has a history of asthma but has      not used her albuterol inhaler for  over 3 years. She also reports      that she smokes 3 packs per day. A chest x-ray was obtained and      showed a pattern that was consistent with viral versus multilobar      pneumonia. She was started on azithromycin as well as albuterol and      Atrovent nebulizers. She was initially started on Solu-Medrol 60 mg      IV every 6 hours and then transitioned to oral prednisone 40 mg      once daily. At the time of her discharge, she is tolerating      albuterol nebulizers every 6 hours and does not have any wheezes or      any increased respiratory effort. She did receive a prescription      for albuterol to use as needed for shortness of breath. She  was      highly encouraged to quit or cut back on smoking cigarettes.  2. Migraines. The patient reported a history of migraine headaches and      had a headache on admission. She received Tylenol as needed for      pain, and her headache quickly resolved without any complication.  3. Diabetes. The patient was on Glyburide and insulin prior to      admission. Her 2-hour postprandial glucose readings were as      follows:  120 and 151. We had one fasting result which was 150;      however, she had not truly fasted for a complete 8 hours when this      was obtained.  4. Fetal bradycardia. The main reason for wishing to keep the patient      hospitalized was in order to fully evaluate the patient's fetal      bradycardia. It was noted that the baby's heart rate was difficult      to trace and would sometimes drop into the 90s. Dr. Margot Ables with      maternal fetal medicine was consulted for this reason and evaluated      the patient. He recommended that pediatric cardiology evaluate the      patient. Dr. Mayer Camel did see the patient on the day of discharge and      performed a fetal echocardiogram. The preliminary report is that      the echocardiogram was completely within normal limits without      evidence for heart block or structural  abnormality. Dr. Mayer Camel did      not require any further workup or evaluation in his consult note.      The concern for the fetal bradycardia was thoroughly discussed with      the patient prior to discharge. She, however, opted to leave the      hospital against medical advice due to childcare concerns.     ______________________________  Sylvan Cheese, M.D.      Phil D. Okey Dupre, M.D.  Electronically Signed    MJ/MEDQ  D:  12/17/2006  T:  12/18/2006  Job:  [id number]   cc:   Margot Ables, M.D.  [facility]   [facility]  [facility]

## 2010-11-26 NOTE — Discharge Summary (Signed)
NAME:  [name], [name] NO.:  [id number]   MEDICAL RECORD NO.:  [id number]          PATIENT TYPE:  INP   LOCATION:                                [facility]   PHYSICIAN:  Ginger Carne, MD  DATE OF BIRTH:  12-01-1985   DATE OF ADMISSION:  01/18/2007  DATE OF DISCHARGE:  01/20/2007                               DISCHARGE SUMMARY   ADMITTING DIAGNOSIS:  37-week gestation, uncontrolled A2 gestational  diabetes and scheduled repeat cesarean section.   DISCHARGE DIAGNOSES:  Repeat cesarean section.   SERVICE:  OB Teaching Service   ATTENDING PHYSICIAN:  Ginger Carne, M.D.   RESIDENT:  Eustaquio Boyden, M.D.   HISTORY:  Patient is a 25 year old G6, P3-1-2-4, with history of  gestational diabetes, type A2, who is admitted for a repeat low vertical  cesarean section, bilateral tubal ligation at [redacted] weeks gestation.  Patient received prenatal care at [facility].  Cesarean section done on January 18, 2007, at 8:30 a.m.  Operation went  without complications under spinal anesthesia, surgeon Dr. Mia Creek.  After bilateral tubal ligation, right and left portions of the tubes  were sent to pathology.  Estimated blood loss less than 500 mL.  A  viable female was delivered, vertex, with Apgars of 8 and 9 at one  minute and five minutes, respectively.  Weight of baby was 7 pounds 11  ounces.   Patient plans to breast and bottle feed.   Contraception status:  Post bilateral tubal ligation.   Patient was given one dose of RhoGAM in hospital because patient's blood  type was B-negative and baby's blood type was B-positive.  RPR negative.  Rubella immune.  HIV nonreactive.  Gonorrhea and Chlamydia screen  negative.  During hospital stay, the patient's blood sugars were well  controlled with insulin.  Rest of hospital course unremarkable.   Postoperative hemoglobin 11.6, postoperative hematocrit 34 on January 19, 2007.   DISCHARGE INFORMATION:   Patient to be discharged on July 9 in the  afternoon.  Activity - no heavy lifting for six weeks.  Diet routine.   DISCHARGE MEDICATIONS:  1. Percocet 5/325 mg one to two p.o. q. 4 hours p.r.n. pain.  2. Motrin 600 mg p.o. one q. 6 hours p.r.n. cramping.  3. Colace 100 mg p.o. one b.i.d. p.r.n. constipation.  4. Prenatal vitamin one p.o. daily.  5. Flexeril 10 mg one p.o. three times a day.  6. other medications as per previous instructions   PATIENT STATUS:  Well.   ROUTINE INSTRUCTIONS:  Baby will be discharged to home with mom  to  follow up in six weeks at [facility] and to  follow up and schedule appointment as soon as possible with back pain  doctor.  Baby Love to come out to patient's house on postoperative day  #5 to #7 to remove staples and for wound followup.      Eustaquio Boyden, MD      Ginger Carne, MD  Electronically Signed    JG/MEDQ  D:  01/20/2007  T:  01/20/2007  Job:  [id number]

## 2010-11-29 NOTE — Op Note (Signed)
NAME:  [name], [name]                       ACCOUNT NO.:  [id number]   MEDICAL RECORD NO.:  [id number]                   PATIENT TYPE:  OBV   LOCATION:  0350                                 FACILITY:  [facility]   PHYSICIAN:  Currie Paris, M.D.           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   POSTOPERATIVE DIAGNOSES:  Chronic calculous cholecystitis with recent  episode of biliary colic.   OPERATION:  Laparoscopic cholecystectomy.   SURGEON:  Currie Paris, M.D.   ASSISTANT:  Vikki Ports, M.D.   ANESTHESIA:  General endotracheal.   HISTORY:  This patient is a 25 year old who presented with somewhat cryptic  abdominal pain to the emergency room on Saturday.  Workup showed right  ovarian cyst but also what appeared to be acute cholecystitis and she indeed  was tender in the right upper quadrant and has been having problems with  biliary type symptoms in the past especially diarrhea with eating anything  greasy or fried. After discussion with the patient, she elected to proceed  to cholecystectomy.   DESCRIPTION OF PROCEDURE:  The patient was seen in the holding and had no  further questions.  She was taken to the operating room and after  satisfactory general endotracheal anesthesia had been obtained, the abdomen  was prepped and draped.  0.25% Marcaine was used for each incision.   The skin was opened and the peritoneal cavity entered at the umbilicus  initially and a pursestring placed. The Hasson was introduced and the  abdomen insufflated to 15.  There were a few omental adhesions to the lower  midline. There was a right ovarian cyst which had evidence of recent rupture  and may have been an ovulation cyst.  The left side was not visualized.  The  gallbladder was a little bit contracted and thick walled.   The patient was placed in reverse Trendelenburg and tilted to the left.  Under direct vision, a 10/11 trocar was placed in the epigastrium and two  5's laterally.  The gallbladder was retracted over the liver and some  omental adhesions taken down. The peritoneum over the cystic duct was opened  and cystic duct dissected out for a nice length.  This was a fairly long  gallbladder and I could not really get a lot of mobility to dissect out the  artery initially but once I had the anatomy fairly clear, I clipped the  cystic duct and opened it. A Cook catheter was placed percutaneously and  threaded in and held with a clip and operative cholangiography done.  This  showed a nice cystic duct, good filling of the common duct and hepatic  radicles and nice emptying of the duodenum with no evidence  of filling  defects.   The catheter was removed and two clips were placed on the cystic duct and it  was divided.  The cystic artery was identified, triple clipped leaving two  on the stay side and it was divided.  The gallbladder was removed from below  to above with coagulation current of the current. Just prior to  disconnecting, we irrigated and made sure everything was dry.   The gallbladder was brought out the umbilical port. We reinsufflated and  made a final check for hemostasis and again everything was dry.  The lateral  ports were removed under direct vision and there was no bleeding.  The  umbilical port was closed with a pursestring. The abdomen was deflated  through the epigastric port.  The skin was closed with 4-0 Monocryl  subcuticular and Dermabond.   The patient tolerated the procedure well. There were no operative  complications.  All counts were correct.                                               Currie Paris, M.D.    CJS/MEDQ  D:  01/03/2004  T:  01/03/2004  Job:  [id number]

## 2010-11-29 NOTE — Op Note (Signed)
NAME:  [name], [name]      ACCOUNT NO.:  [id number]   MEDICAL RECORD NO.:  [id number]          PATIENT TYPE:  AMB   LOCATION:  SDC                           [facility]ILITY:  [facility]   PHYSICIAN:  Phil D. Okey Dupre, M.D.     DATE OF BIRTH:  26-Jan-1986   DATE OF PROCEDURE:  04/02/2006  DATE OF DISCHARGE:  04/02/2006                                 OPERATIVE REPORT   PROCEDURE:  Diagnostic laparoscopy with lysis of pelvic adhesions.   PREOPERATIVE DIAGNOSIS:  Chronic lower abdominal pain.   POSTOPERATIVE DIAGNOSIS:  Normal female pelvis with some filmy adhesions.   SURGEON:  Javier Glazier. Okey Dupre, M.D.   ANESTHESIA:  General.   SPECIMENS TO PATHOLOGY:  None.   POSTOPERATIVE CONDITION:  Satisfactory.   INDICATIONS FOR PROCEDURE:  The patient is a 25 year old gravida 5, para 3-0-  2-3, with a history of three cesarean sections who has had one month of  severe lower abdominal pain which was tender to abdominal touch without  guarding or rebound.  It extended from just below her umbilicus to the  suprapubic area.  Before surgery today, when talking to the patient, she  that recently it had radiated down into her thighs, both front and back, and  into the lower back.   PROCEDURE IN DETAIL:  Under satisfactory general anesthesia, with the  patient in the dorsal lithotomy position, the perineum, vagina, and abdomen  were prepped and draped in the usual sterile manner.  Bimanual pelvic  examination under anesthesia revealed the uterus normal size, shape, and  consistency, the adnexa could not be well outlined.  A weighted speculum was  placed into the vagina, the anterior lip of the cervix grasped with a single  tooth tenaculum.  An intrauterine probe was attached to the cervix for  mobilization of the uterus.  A 1 cm vertical incision was made just below  the umbilicus.  A Veress needle was attempted to be inserted into the  peritoneal cavity.  We could not get a low enough pressure.  When this was  done, I tried to do this in the cul-de-sac with the same response, so I  decided to go in under direct vision and went through the incision below the  umbilicus, opened the peritoneal cavity, and put the direct vision trocar  in, removed the sleeve, inserted carbon dioxide until the abdomen became  distended to some degree.  The laparoscope inserted and the pelvic organs  were found to be completely normal with the exception of some filmy  adhesions from the anterior abdominal wall to the uterus secondary to the  previous cesarean section.  These were cut up with the small scissors.  There was no other sign of endometriosis, inflammation, or any thing else to  explain this patient's problem.  The scope was removed from the sleeve, CO2  was allowed to express through the sleeve, the sleeve then removed, and the  incision closed with a 3-0 Vicryl suture  closing the fascia which was run up through a subcuticular suture for wound  closure.  The patient will be told of the problem and I would recommend that  she see a Midwife.  I think possibly her problem is due to lumbar disc  pain rather than gynecological, I could see no gynecological reason for this  pain.           ______________________________  Javier Glazier. Okey Dupre, M.D.     PDR/MEDQ  D:  04/02/2006  T:  04/04/2006  Job:  [id number]

## 2010-11-29 NOTE — Op Note (Signed)
[name], [name]                         ACCOUNT NO.:  [id number]   MEDICAL RECORD NO.:  [id number]                   PATIENT TYPE:  INP   LOCATION:  9198                                 [facility]ACILITY:  [facility]   PHYSICIAN:  Clement Husbands, M.D.         DATE OF BIRTH:  1986-02-23   DATE OF PROCEDURE:  02/06/2003  DATE OF DISCHARGE:                                 OPERATIVE REPORT   PREOPERATIVE DIAGNOSES:  1. Term pregnancy.  2. Previous low transverse cervical cesarean section.   POSTOPERATIVE DIAGNOSES:  1. Term pregnancy.  2. Previous low transverse cervical cesarean section.   OPERATION:  Repeat low transverse cervical cesarean section.   SURGEON:  Burnadette Peter, M.D.   ASSISTANT:  Nursing staff.   ANESTHESIA:  Spinal.   DESCRIPTION OF PROCEDURE:  With the patient under satisfactory spinal  anesthesia in the supine position and slightly tilted to the left, the  urethra was cleansed with Betadine and a Foley catheter inserted.  The  abdomen was then prepped and draped.  Her large panniculus had been taped  so that it was lifted up toward the top of the table.  A low abdominal  transverse skin incision was made in her old scar and carried down through  an extremely thick subcutaneous tissue layer.  An awful lot of scarring was  noted.  Both sharp knife and cutting cautery were used.  Finally the fascia  was identified.  It was transversely incised and then divided out laterally  in both directions.  The recti muscles were divided in the midline  inferiorly.  There was as lot of scarring of subfascial adipose tissue  adherent to the peritoneum.  Through the upper part of the peritoneum the  abdominal cavity was entered.  The peritoneum was vertically divided and  inferiorly divided.  The bladder blade was positioned.  There was an awful  lot of scarring then of the vesicouterine peritoneum.  This was incised and  pushed inferiorly, the bladder blade repositioned.   A lower segment  transverse uterine incision was then made into the uterine cavity and then  bluntly pulled laterally in each direction.  Amniotic fluid was clear.  Vertex presentation was noted.  The vertex was lifted into the incision.  A  soft cup vacuum was applied and the vertex easily delivered.  The  nasopharynx was suctioned.  Spontaneous respirations and crying were noted.  The nuchal cord was divided and the infant passed off to the awaiting  neonatal team.  A segment of cord blood was clamped and passed off the table  so the pH and cord bloods could be obtained.  The placenta was markedly  adherent in the fundal portion of the uterus and required manual removal to  get it out.  The uterus was then explored and was felt to be smooth on the  inside.  The internal os was dilated.  Intravenous Pitocin drip  was started.  Intravenous Ancef was given.   The uterine incision was then closed with a running locking 0 Vicryl suture.  A second layer imbricated the first with good hemostasis.  The vesicouterine  peritoneum was reapproximated with a running 2-0 Vicryl suture.  An attempt  was made to close the abdominal peritoneum but because of all the scarring  and adipose tissue there, it was left open.  Recti muscles, however, were  put together with interrupted 0 Vicryl sutures.  The rectus fascia was then  closed with running 0 Vicryl suture beginning laterally on each side and  taken to the midline, where it was tied separately.  The subcutaneous tissue  layer was then irrigated and hemostasis was good.  Interrupted 2-0 Vicryl  suture was used on the subcutaneous tissue to decrease the dead space.  Skin edges were then approximated with wide skin staples.  Estimated blood  loss was about 800 mL.  Sponge and needle count was correct.  She tolerated  the procedure well and was returned to the recovery room in satisfactory  condition.   She was delivered of a female infant weighing 8  pounds 6 ounces.  The Apgar  was 9 and 9.  Arterial cord pH was 7.30.                                               Clement Husbands, M.D.    EFR/MEDQ  D:  02/06/2003  T:  02/06/2003  Job:  [id number]

## 2010-11-29 NOTE — Group Therapy Note (Signed)
NAME:  [name], [name] NO.:  [id number]   MEDICAL RECORD NO.:  [id number]          PATIENT TYPE:  WOC   LOCATION:  [facility]                   FACILITY:  [facility]   PHYSICIAN:  Argentina Donovan, MD        DATE OF BIRTH:  1986-03-06   DATE OF SERVICE:                                    CLINIC NOTE   The patient is a 25 year old, gravida 5, para 3-0-2-3 with a history of 3  cesarean sections and cholecystectomy, who presented at [facility]  at the end of August with severe lower abdominal pain, nausea, and diarrhea.  She was seen there and treated with Flagyl for a vaginal infection. Told to  go to the MAU if she did not get better. The patient did not get better and  presented herself at the MAU on March 15, 2006. At that time she was  complaining of the pain for the last three weeks progressively getting  worse. She had been treated for cervicitis and BV, she stated, and said she  had a fever that went as high as 103 the previous week. She had (and  continues to have) nausea but rarely, but diarrhea comes after she eats. She  was given Vicodin at [facility] that did not help, but the Percocet seemed  to. She has been very careful with not wanting to become dependent on it.   PAST MEDICAL HISTORY:  Three C-sections, two spontaneous abortions,  gallbladder surgery. She also has a history of migraines and bronchial  asthma. She has had some low back pain since her last delivery a year ago.  When she presented at MAU, she had a Mirena IUD which was removed. She was  treated as PID because of an elevated white count of 15,000 with Rocephin  and Zithromax and placed on doxycycline for a week. She had a CAT scan of  the lower abdomen which was normal. The appendix was seen and also was  normal. The patient over the past week and a half has not gotten any better.  Her nausea has abated, but she denies frequency or dysuria at the present  time. She cannot work because  any tight clothes increase her pain.   PHYSICAL EXAMINATION:  ABDOMEN:  Soft, flat, tender suprapubically in both  lower quadrants to deep palpation but without guarding or rebound.  PELVIC:  External genitalia is normal. BUN is within normal limits. Vagina  is clean and well rugated.  Cervix is clean. Nulliparous cervix. The patient  has pain significantly on motion of the cervix, but does not seem to have  any pain when the anterior wall of the vagina is pushed upward against a  down pushing hand. The only thing in the past history that gives me any  indication of other problems within the pelvic cavity is that she said the  last C-section took a long time because of scar tissue.   IMPRESSION:  She has chronic pelvic pain of unknown etiology. It appears to  be pelvic inflammatory disease, but did not respond to appropriate  medication. There is no sign of any pelvic abscesses.   PLAN:  Schedule the patient for a laparoscopic examination. Get CBC and sed  rate repeated and urine and urine culture.           ______________________________  Argentina Donovan, MD    PR/MEDQ  D:  03/26/2006  T:  03/27/2006  Job:  [id number]

## 2010-11-29 NOTE — Op Note (Signed)
NAME:  [name], [name]                       ACCOUNT NO.:  [id number]   MEDICAL RECORD NO.:  [id number]                   PATIENT TYPE:  AMB   LOCATION:  [facility]                                 [facility]   PHYSICIAN:  Conni Elliot, M.D.             DATE OF BIRTH:  05-29-1986   DATE OF PROCEDURE:  03/15/2004  DATE OF DISCHARGE:                                 OPERATIVE REPORT   PREOPERATIVE DIAGNOSIS:  Missed abortion.   POSTOPERATIVE DIAGNOSIS:  Missed abortion.   OPERATION:  Dilatation and evacuation.   SURGEON:  Conni Elliot, M.D.   DATE OF SURGERY:  March 15, 2004   ANESTHESIA:  MAC.   DESCRIPTION OF PROCEDURE:  The patient was placed in the dorsal lithotomy  position.  Perineum and vagina prepped as well as anus with Betadine  solution.  The bladder was drained.  The anterior cervix was grasped.  A  weighted speculum was placed in the posterior vagina.  The cervix was opened  easily to a sound.  The cervix was dilated up to a #25 and #27 showed some  resistance.  It was elected to use a #8 suction curette.  The uterus was  evacuated with suction curettage and completeness was identified by sharp  curettage.  Estimated blood loss was less than 50 mL.  Needles and sponge  correct.                                               Conni Elliot, M.D.    ASG/MEDQ  D:  03/15/2004  T:  03/16/2004  Job:  [id number]

## 2010-11-29 NOTE — Discharge Summary (Signed)
[name], [name]             ACCOUNT NO.:  [id number]   MEDICAL RECORD NO.:  [id number]          PATIENT TYPE:  INP   LOCATION:  9146                          [facility]  [facility]   PHYSICIAN:  Bonnita Hollow, M.D.DATE OF BIRTH:  Dec 08, 1985   DATE OF ADMISSION:  12/31/2004  DATE OF DISCHARGE:  01/03/2005                                 DISCHARGE SUMMARY   ADMISSION DIAGNOSES:  33.  A 25 year old G5, P2-0-2-2 at 39 weeks.  2.  GBS positive.  3.  Rh negative.   DISCHARGE DIAGNOSES:  22.  A 25 year old G5, P3-0-2-3.  2.  Rh negative.   DISCHARGE MEDICATIONS:  1.  Motrin 600 mg q.6-8h. p.r.n.  2.  Percocet 5/325 one or two tablets q.6h. p.r.n. pain.  3.  Depo-Provera IM  given x1 on January 03, 2005.  4.  Colace 100 mg p.o. daily.  5.  Prenatal vitamin one p.o. daily.   HOSPITAL COURSE:  Ms. [name] presented to [facility] on December 31, 2004, at 39  weeks requesting a repeat cesarean section.  Patient was GBS positive, Rh  negative.  Vital signs at admission were temperature 97.5, pulse 106,  respiratory rate 20, blood pressure 154/80, fetal heart rate 128.  Patient  was taken to OR for low transverse cesarean section and lysis of intra-  abdominal adhesions, given epidural for anesthesia.  Baby was delivered in  vertex presentation.  Placenta was delivered spontaneously.  Estimated blood  loss was 800 mL without replacement.  Patient remained in stable condition  postoperatively, ambulating 24 hours after surgery.  Pain well controlled on  Motrin and Percocet p.r.n.  Patient requested Depo-Provera for birth control  until six weeks postpartum where she plans to have an IUD placed at [facility].   DISCHARGE LABORATORY DATA:  Blood type B negative.  Rubella immune.  GBS  positive.  Hemoglobin 11.8.   FOLLOW UP:  Patient will follow up at [facility] six weeks postpartum.        VRE/MEDQ  D:  01/03/2005  T:  01/03/2005  Job:  [id number]

## 2010-11-29 NOTE — Discharge Summary (Signed)
   [name], [name]                         ACCOUNT NO.:  [id number]   MEDICAL RECORD NO.:  [id number]                   PATIENT TYPE:  INP   LOCATION:  9127                                 [facility]   PHYSICIAN:  Ace Gins, MD                  DATE OF BIRTH:  10-10-1985   DATE OF ADMISSION:  02/06/2003  DATE OF DISCHARGE:  02/09/2003                                 DISCHARGE SUMMARY   HISTORY OF PRESENT ILLNESS:  The patient is an 25 year old gravida 3, para 1-  0-1-1, who presented with an intrauterine pregnancy at term for a scheduled  C-section secondary to a previous low transverse Cesarean section. On February 06, 2003 the patient delivered a viable female infant with Apgar's of 9 and 9.  Hospital course and postoperative course were unremarkable.   LABORATORY DATA:  Hemoglobin and hematocrit on admission 12.1 and 35.1.  Hemoglobin and hematocrit upon discharge 11.0 and 31.8.   DISPOSITION:  The patient was discharged on February 09, 2003 on postoperative  day 3 in good condition complaining of some abdominal pain and was  prescribed Ibuprofen 600 mg p.o. q. 6h. p.r.n. and Percocet 5/325 p.o. q.  4h. p.r.n. and for pain.   FOLLOW UP:  She is to followup in the Maternity Admissions Unit on Sunday  postoperative day 6 for staple removal. She is also to followup in 6 weeks  at regular appointment with [facility]. She is bottle feeding and plans  an NuvaRing for birth control. Was given prescription at time of discharge.  Otherwise, plan routine postpartum care.                                               Ace Gins, MD    JS/MEDQ  D:  02/09/2003  T:  02/09/2003  Job:  [id number]   cc:   [facility]

## 2010-11-29 NOTE — Op Note (Signed)
[name], [name]             ACCOUNT NO.:  [id number]   MEDICAL RECORD NO.:  [id number]          PATIENT TYPE:  INP   LOCATION:  9146                          [facility]   PHYSICIAN:  Conni Elliot, M.D.DATE OF BIRTH:  February 07, 1986   DATE OF PROCEDURE:  12/31/2004  DATE OF DISCHARGE:                                 OPERATIVE REPORT   PREOPERATIVE DIAGNOSIS:  Prior cesarean delivery, requesting repeat at term.   POSTOPERATIVE DIAGNOSES:  1.  Prior cesarean delivery, requesting repeat at term.  2.  Intra-abdominal adhesions.   OPERATION:  Low transverse cesarean section and lysis of intra-abdominal  adhesions.   OPERATOR:  Conni Elliot, M.D.   ANESTHESIA:  Epidural.   ESTIMATED BLOOD LOSS:  Less than 800 mL without replacement.   OPERATIVE PROCEDURE:  After placing the patient under epidural anesthetic,  the patient supine in the left tilt position, abdomen was entered through a  low transverse Pfannenstiel incision.  Incision made through the fascia.  Peritoneal cavity entered.  Upon entering the abdominal cavity, it was noted  to have omental adhesions, and these were taken down by clamping, cut, and  tie.  Then the peritoneal incision still was not big enough, so this was  extended by hot cautery to both sides laterally.  Upon going to the right  lateral aspect, the uterus was very levorotated, and one of the branches of  the uterine cautery was cut into, and a figure-of-eight suture ligature was  able to obtain hemostasis.  A bladder flap was then created, and a low  transverse uterine incision was made.  The baby was delivered from the  vertex presentation, cord doubly clamped and cut, handed to neonatology in  attendance.  Placenta was delivered spontaneously.  The uterus and bladder  flap were closed in a routine fashion, although again the right aspect was  not able to be reapproximated.  The uterus was placed into the abdominal  cavity, reexamined for  hemostasis, and there was hemostasis.  There was no  anterior peritoneum to close, and so the rectus muscles were brought across  the midline using #1 chromics interrupted.  The fascia and subcutaneous and  skin were closed in routine fashion.  Estimated blood loss was approximately  800 mL without replacement.       ASG/MEDQ  D:  12/31/2004  T:  12/31/2004  Job:  [id number]

## 2010-11-29 NOTE — Op Note (Signed)
NAME:  RETTA, [name]      ACCOUNT NO.:  [id number]   MEDICAL RECORD NO.:  [id number]          PATIENT TYPE:  AMB   LOCATION:  [facility]                           [facility]   PHYSICIAN:  Phil D. Okey Dupre, M.D.     DATE OF BIRTH:  1986-06-12   DATE OF PROCEDURE:  04/02/2006  DATE OF DISCHARGE:                                 OPERATIVE REPORT   PROCEDURE:  Diagnostic laparoscopy, lysis of adhesions.   PREOPERATIVE DIAGNOSIS:  Low abdominal pain.   POSTOPERATIVE DIAGNOSIS:  Normal female  pelvis with a few filmy adhesions.   SURGEON:  Javier Glazier. Okey Dupre, M.D.   ANESTHESIA:  General .   ESTIMATED BLOOD LOSS:  10 mL.   SPECIMENS TO PATHOLOGY:  None.   POSTOPERATIVE CONDITION:  Satisfactory.   REASON FOR PROCEDURE:  The patient is a 25 year old white female gravida 5,  para 3-0-2-3, with a history of two cesarean sections who has had lower  abdominal pain, extremely tender to superficial abdominal touch without  guarding or rebound for about one month.  The day prior to operation, she  revealed to me the pain is now extending down into the lower thighs and even  down into her foot on the right side radiated to the back.   Dictation ends here.           ______________________________  Javier Glazier. Okey Dupre, M.D.     PDR/MEDQ  D:  04/02/2006  T:  04/03/2006  Job:  [id number]

## 2010-12-02 ENCOUNTER — Other Ambulatory Visit: Payer: Self-pay | Admitting: Family Medicine

## 2010-12-02 ENCOUNTER — Ambulatory Visit
Admission: RE | Admit: 2010-12-02 | Discharge: 2010-12-02 | Disposition: A | Discharge status: Home / Self Care | Payer: Medicaid Other | Source: Ambulatory Visit | Attending: Family Medicine | Admitting: Family Medicine

## 2010-12-02 DIAGNOSIS — R05 Cough: Secondary | ICD-10-CM

## 2011-03-08 ENCOUNTER — Inpatient Hospital Stay (INDEPENDENT_AMBULATORY_CARE_PROVIDER_SITE_OTHER)
Admission: RE | Admit: 2011-03-08 | Discharge: 2011-03-08 | Disposition: A | Discharge status: Home / Self Care | Payer: Medicaid Other | Source: Ambulatory Visit | Attending: Emergency Medicine | Admitting: Emergency Medicine

## 2011-03-08 DIAGNOSIS — L738 Other specified follicular disorders: Secondary | ICD-10-CM

## 2011-03-08 DIAGNOSIS — N760 Acute vaginitis: Secondary | ICD-10-CM

## 2011-03-08 DIAGNOSIS — A499 Bacterial infection, unspecified: Secondary | ICD-10-CM

## 2011-03-08 LAB — WET PREP, GENITAL
Clue Cells Wet Prep HPF POC: NONE SEEN
Yeast Wet Prep HPF POC: NONE SEEN

## 2011-03-10 LAB — GC/CHLAMYDIA PROBE AMP, GENITAL: GC Probe Amp, Genital: NEGATIVE

## 2011-04-03 LAB — COMPREHENSIVE METABOLIC PANEL
AST: 28
Albumin: 3.7
Alkaline Phosphatase: 89
BUN: 7
CO2: 28
Chloride: 108
GFR calc Af Amer: >60
Potassium: 4.1
Total Bilirubin: 0.8

## 2011-04-03 LAB — URINALYSIS, ROUTINE W REFLEX MICROSCOPIC
Glucose, UA: NEGATIVE
Hgb urine dipstick: NEGATIVE
Protein, ur: 30 — AB

## 2011-04-03 LAB — CBC
HCT: 41.2
Platelets: 161
RBC: 4.91
WBC: 10.5

## 2011-04-03 LAB — DIFFERENTIAL
Basophils Absolute: 0.1
Basophils Relative: 1
Eosinophils Relative: 3
Monocytes Absolute: 0.5

## 2011-04-03 LAB — URINE MICROSCOPIC-ADD ON

## 2011-04-03 LAB — POCT PREGNANCY, URINE: Preg Test, Ur: NEGATIVE

## 2011-04-04 LAB — COMPREHENSIVE METABOLIC PANEL
AST: 32
CO2: 26
Chloride: 107
Creatinine, Ser: 0.68
GFR calc Af Amer: >60
GFR calc non Af Amer: >60
Total Bilirubin: 0.7

## 2011-04-04 LAB — DIFFERENTIAL
Basophils Absolute: 0
Eosinophils Absolute: 0.2
Eosinophils Relative: 2
Lymphocytes Relative: 41

## 2011-04-04 LAB — URINALYSIS, ROUTINE W REFLEX MICROSCOPIC
Bilirubin Urine: NEGATIVE
Ketones, ur: NEGATIVE
Nitrite: NEGATIVE
Protein, ur: NEGATIVE
Specific Gravity, Urine: 1.026
Urobilinogen, UA: 0.2

## 2011-04-04 LAB — I-STAT 8, (EC8 V) (CONVERTED LAB)
BUN: 9
Chloride: 108
Glucose, Bld: 83
HCT: 43
pCO2, Ven: 47.3
pH, Ven: 7.342 — ABNORMAL HIGH

## 2011-04-04 LAB — CBC
HCT: 41
MCV: 86.2
RBC: 4.76
WBC: 10.4

## 2011-04-04 LAB — LIPASE, BLOOD: Lipase: 25

## 2011-04-07 LAB — URINALYSIS, ROUTINE W REFLEX MICROSCOPIC
Bilirubin Urine: NEGATIVE
Nitrite: NEGATIVE
Specific Gravity, Urine: 1.034 — ABNORMAL HIGH
Urobilinogen, UA: 0.2
pH: 7.5

## 2011-04-08 LAB — CBC
Platelets: 188
RDW: 12
WBC: 10.5

## 2011-04-08 LAB — GC/CHLAMYDIA PROBE AMP, GENITAL
Chlamydia, DNA Probe: NEGATIVE
GC Probe Amp, Genital: NEGATIVE

## 2011-04-08 LAB — POCT PREGNANCY, URINE
Operator id: 292781
Preg Test, Ur: NEGATIVE

## 2011-04-08 LAB — WET PREP, GENITAL
Trich, Wet Prep: NONE SEEN
Yeast Wet Prep HPF POC: NONE SEEN

## 2011-04-11 LAB — CBC
Hemoglobin: 14.9
MCHC: 34
RBC: 5
WBC: 11.1 — ABNORMAL HIGH

## 2011-04-11 LAB — WET PREP, GENITAL
Trich, Wet Prep: NONE SEEN
Yeast Wet Prep HPF POC: NONE SEEN

## 2011-04-11 LAB — URINALYSIS, ROUTINE W REFLEX MICROSCOPIC
Bilirubin Urine: NEGATIVE
Glucose, UA: NEGATIVE
Ketones, ur: NEGATIVE
Nitrite: NEGATIVE
Specific Gravity, Urine: <1.005 — ABNORMAL LOW
pH: 5.5

## 2011-04-11 LAB — GC/CHLAMYDIA PROBE AMP, GENITAL: Chlamydia, DNA Probe: NEGATIVE

## 2011-04-11 LAB — RAPID URINE DRUG SCREEN, HOSP PERFORMED
Amphetamines: NOT DETECTED
Barbiturates: NOT DETECTED
Benzodiazepines: NOT DETECTED

## 2011-04-11 LAB — POCT PREGNANCY, URINE: Preg Test, Ur: NEGATIVE

## 2011-04-18 LAB — URINALYSIS, ROUTINE W REFLEX MICROSCOPIC
Glucose, UA: NEGATIVE
Glucose, UA: NEGATIVE
Hgb urine dipstick: NEGATIVE
Ketones, ur: NEGATIVE
Nitrite: NEGATIVE
Protein, ur: NEGATIVE
Specific Gravity, Urine: 1.025
Urobilinogen, UA: 0.2
pH: 6

## 2011-04-18 LAB — CBC
HCT: 41.7
Hemoglobin: 14.8
Hemoglobin: 16.4 — ABNORMAL HIGH
MCHC: 34.6
MCV: 84.7
RBC: 4.98
RBC: 5.59 — ABNORMAL HIGH
WBC: 10.7 — ABNORMAL HIGH

## 2011-04-18 LAB — DIFFERENTIAL
Basophils Absolute: 0.1
Basophils Relative: 1
Eosinophils Absolute: 0.4
Monocytes Relative: 5
Neutrophils Relative %: 56

## 2011-04-18 LAB — COMPREHENSIVE METABOLIC PANEL
ALT: 43 — ABNORMAL HIGH
Alkaline Phosphatase: 103
BUN: 9
CO2: 24
Chloride: 107
GFR calc non Af Amer: >60
Glucose, Bld: 92
Potassium: 3.8
Sodium: 138
Total Bilirubin: 0.8
Total Protein: 7.7

## 2011-04-18 LAB — PREGNANCY, URINE: Preg Test, Ur: NEGATIVE

## 2011-04-18 LAB — WET PREP, GENITAL: Clue Cells Wet Prep HPF POC: NONE SEEN

## 2011-04-28 LAB — DIFFERENTIAL
Eosinophils Relative: 9 — ABNORMAL HIGH
Lymphocytes Relative: 32
Lymphs Abs: 2.8
Monocytes Relative: 4

## 2011-04-28 LAB — CULTURE, ROUTINE-ABSCESS: Gram Stain: NONE SEEN

## 2011-04-28 LAB — SEDIMENTATION RATE: Sed Rate: 50 — ABNORMAL HIGH

## 2011-04-28 LAB — CBC
HCT: 36.5
Hemoglobin: 12.4
MCHC: 34.1
MCV: 82.9
RDW: 12.9

## 2011-04-29 LAB — CBC
HCT: 33.6 — ABNORMAL LOW
HCT: 34 — ABNORMAL LOW
Hemoglobin: 11.5 — ABNORMAL LOW
Hemoglobin: 11.6 — ABNORMAL LOW
MCV: 85
Platelets: 170
RDW: 13.3
WBC: 14.9 — ABNORMAL HIGH
WBC: 17.5 — ABNORMAL HIGH

## 2011-04-29 LAB — ABO/RH: ABO/RH(D): B NEG

## 2011-04-29 LAB — TYPE AND SCREEN

## 2011-04-29 LAB — COMPREHENSIVE METABOLIC PANEL
Alkaline Phosphatase: 137 — ABNORMAL HIGH
BUN: 7
Chloride: 105
GFR calc non Af Amer: >60
Glucose, Bld: 106 — ABNORMAL HIGH
Potassium: 3.5
Total Bilirubin: 0.5

## 2011-04-29 LAB — URIC ACID: Uric Acid, Serum: 4.9

## 2011-04-29 LAB — LACTATE DEHYDROGENASE: LDH: 130

## 2011-04-29 LAB — RH IMMUNE GLOB WKUP(>/=20WKS)(NOT WOMEN'S HOSP)

## 2011-04-30 LAB — URINALYSIS, ROUTINE W REFLEX MICROSCOPIC
Bilirubin Urine: NEGATIVE
Glucose, UA: NEGATIVE
Hgb urine dipstick: NEGATIVE
Ketones, ur: NEGATIVE
Protein, ur: NEGATIVE

## 2011-04-30 LAB — POCT URINALYSIS DIP (DEVICE)
Bilirubin Urine: NEGATIVE
Glucose, UA: NEGATIVE
Nitrite: NEGATIVE
Operator id: 159681
Operator id: 200901
Protein, ur: 100 — AB
Specific Gravity, Urine: 1.015
Urobilinogen, UA: 0.2

## 2011-04-30 LAB — CULTURE, ROUTINE-ABSCESS

## 2011-05-01 LAB — CBC
Hemoglobin: 11.6 — ABNORMAL LOW
MCHC: 34.7
MCV: 87
RDW: 13.3

## 2011-05-01 LAB — URINALYSIS, ROUTINE W REFLEX MICROSCOPIC
Nitrite: NEGATIVE
Protein, ur: 100 — AB
Specific Gravity, Urine: 1.015
Urobilinogen, UA: 1

## 2011-05-01 LAB — CULTURE, RESPIRATORY W GRAM STAIN

## 2011-05-01 LAB — URINE MICROSCOPIC-ADD ON

## 2011-05-04 ENCOUNTER — Emergency Department (HOSPITAL_COMMUNITY)
Admission: EM | Admit: 2011-05-04 | Discharge: 2011-05-04 | Disposition: A | Discharge status: Home / Self Care | Payer: Medicaid Other | Attending: Emergency Medicine | Admitting: Emergency Medicine

## 2011-05-04 ENCOUNTER — Emergency Department (HOSPITAL_COMMUNITY): Payer: Medicaid Other

## 2011-05-04 DIAGNOSIS — R51 Headache: Secondary | ICD-10-CM | POA: Insufficient documentation

## 2011-05-04 DIAGNOSIS — R1031 Right lower quadrant pain: Secondary | ICD-10-CM | POA: Insufficient documentation

## 2011-05-04 DIAGNOSIS — M549 Dorsalgia, unspecified: Secondary | ICD-10-CM | POA: Insufficient documentation

## 2011-05-04 DIAGNOSIS — G8929 Other chronic pain: Secondary | ICD-10-CM | POA: Insufficient documentation

## 2011-05-04 DIAGNOSIS — O9989 Other specified diseases and conditions complicating pregnancy, childbirth and the puerperium: Secondary | ICD-10-CM | POA: Insufficient documentation

## 2011-05-04 LAB — URINALYSIS, ROUTINE W REFLEX MICROSCOPIC
Bilirubin Urine: NEGATIVE
Glucose, UA: NEGATIVE mg/dL
Hgb urine dipstick: NEGATIVE
Ketones, ur: NEGATIVE mg/dL
Leukocytes, UA: NEGATIVE
Protein, ur: NEGATIVE mg/dL
pH: 6.5 (ref 5.0–8.0)

## 2011-05-04 LAB — DIFFERENTIAL
Eosinophils Absolute: 0.6 10*3/uL (ref 0.0–0.7)
Lymphocytes Relative: 32 % (ref 12–46)
Lymphs Abs: 3.7 10*3/uL (ref 0.7–4.0)
Monocytes Relative: 5 % (ref 3–12)
Neutrophils Relative %: 57 % (ref 43–77)

## 2011-05-04 LAB — CBC
HCT: 41.5 % (ref 36.0–46.0)
MCH: 30.5 pg (ref 26.0–34.0)
MCV: 84.9 fL (ref 78.0–100.0)
Platelets: 164 10*3/uL (ref 150–400)
RBC: 4.89 MIL/uL (ref 3.87–5.11)
WBC: 11.6 10*3/uL — ABNORMAL HIGH (ref 4.0–10.5)

## 2011-05-04 LAB — BASIC METABOLIC PANEL
BUN: 8 mg/dL (ref 6–23)
CO2: 27 mEq/L (ref 19–32)
Calcium: 9.5 mg/dL (ref 8.4–10.5)
Chloride: 99 mEq/L (ref 96–112)
Creatinine, Ser: 0.65 mg/dL (ref 0.50–1.10)
Glucose, Bld: 90 mg/dL (ref 70–99)

## 2011-05-04 LAB — PREGNANCY, URINE: Preg Test, Ur: POSITIVE

## 2011-05-05 ENCOUNTER — Inpatient Hospital Stay (HOSPITAL_COMMUNITY)
Admission: AD | Admit: 2011-05-05 | Discharge: 2011-05-06 | Disposition: A | Discharge status: Home / Self Care | Payer: Medicaid Other | Source: Ambulatory Visit | Attending: Obstetrics & Gynecology | Admitting: Obstetrics & Gynecology

## 2011-05-05 ENCOUNTER — Encounter (HOSPITAL_COMMUNITY): Payer: Self-pay | Admitting: *Deleted

## 2011-05-05 DIAGNOSIS — O039 Complete or unspecified spontaneous abortion without complication: Secondary | ICD-10-CM | POA: Insufficient documentation

## 2011-05-05 DIAGNOSIS — O034 Incomplete spontaneous abortion without complication: Secondary | ICD-10-CM

## 2011-05-05 LAB — CBC
HCT: 42.3 % (ref 36.0–46.0)
Hemoglobin: 14.7 g/dL (ref 12.0–15.0)
MCH: 30.2 pg (ref 26.0–34.0)
MCV: 86.9 fL (ref 78.0–100.0)
Platelets: 164 10*3/uL (ref 150–400)
RBC: 4.87 MIL/uL (ref 3.87–5.11)

## 2011-05-05 MED ORDER — RHO D IMMUNE GLOBULIN 1500 UNIT/2ML IJ SOLN
300.0000 ug | Freq: Once | INTRAMUSCULAR | Status: AC
  Administered 2011-05-06: 300 ug via INTRAMUSCULAR
  Filled 2011-05-05: qty 2

## 2011-05-05 NOTE — ED Provider Notes (Signed)
History   Pt presents today c/o vag spotting in preg that began today. She states she was seen yesterday at [facility] ER and was told she was pregnant. She states they told her everything was ok and the pregnancy was in the uterus. She states she was told to f/u at the [facility].   Chief Complaint  Patient presents with  . Vaginal Bleeding  . Abdominal Cramping   HPI  OB History    Grav Para Term Preterm Abortions TAB SAB Ect Mult Living   1               No past medical history on file.  No past surgical history on file.  No family history on file.  History  Substance Use Topics  . Smoking status: Not on file  . Smokeless tobacco: Not on file  . Alcohol Use: Not on file    Allergies: Allergies not on file  No prescriptions prior to admission    Review of Systems  Constitutional: Negative for fever.  Eyes: Negative for blurred vision.  Cardiovascular: Negative for chest pain.  Gastrointestinal: Positive for abdominal pain. Negative for nausea, vomiting, diarrhea and constipation.  Genitourinary: Negative for dysuria, urgency, frequency and hematuria.  Neurological: Negative for dizziness and headaches.  Psychiatric/Behavioral: Negative for depression and suicidal ideas.   Physical Exam   Blood pressure 147/87, pulse 89, temperature 98.4 F (36.9 C), temperature source Oral, resp. rate 18, height 5\' 6"  (1.676 m), weight 229 lb 6.4 oz (104.055 kg), last menstrual period 02/19/2011.  Physical Exam  Nursing note and vitals reviewed. Constitutional: She is oriented to person, place, and time. She appears well-developed and well-nourished. No distress.  HENT:  Head: Normocephalic and atraumatic.  Eyes: EOM are normal. Pupils are equal, round, and reactive to light.  GI: Soft. She exhibits no distension. There is no tenderness. There is no rebound and no guarding.  Genitourinary: There is bleeding around the vagina. No vaginal discharge found.  Neurological: She is  alert and oriented to person, place, and time.  Skin: Skin is warm and dry. She is not diaphoretic.  Psychiatric: She has a normal mood and affect. Her behavior is normal. Judgment and thought content normal.    MAU Course  Procedures  Results for orders placed during the hospital encounter of 05/05/11 (from the past 48 hour(s))  CBC     Status: Abnormal   Collection Time   05/05/11 11:33 PM      Component Value Range Comment   WBC 11.6 (*) 4.0 - 10.5 (K/uL)    RBC 4.87  3.87 - 5.11 (MIL/uL)    Hemoglobin 14.7  12.0 - 15.0 (g/dL)    HCT 16.1  09.6 - 04.5 (%)    MCV 86.9  78.0 - 100.0 (fL)    MCH 30.2  26.0 - 34.0 (pg)    MCHC 34.8  30.0 - 36.0 (g/dL)    RDW 40.9  81.1 - 91.4 (%)    Platelets 164  150 - 400 (K/uL)   RH IG WORKUP, [facility]     Status: Normal (Preliminary result)   Collection Time   05/05/11 11:33 PM      Component Value Range Comment   Gestational Age(Wks) 8      ABO/RH(D) PENDING      Antibody Screen PENDING      Ct Head Wo Contrast  05/04/2011  *RADIOLOGY REPORT*  Clinical Data: Headache and dizziness for several months.  CT HEAD WITHOUT  CONTRAST  Technique:  Contiguous axial images were obtained from the base of the skull through the vertex without contrast.  Comparison: CT of the head performed 08/19/2007  Findings: There is no evidence of acute infarction, mass lesion, or intra- or extra-axial hemorrhage on CT.  The posterior fossa, including the cerebellum, brainstem and fourth ventricle, is within normal limits.  The third and lateral ventricles, and basal ganglia are unremarkable in appearance.  The cerebral hemispheres are symmetric in appearance, with normal gray- white differentiation.  No mass effect or midline shift is seen.  There is no evidence of fracture; visualized osseous structures are unremarkable in appearance.  The orbits are within normal limits. The paranasal sinuses and mastoid air cells are well-aerated.  No significant soft tissue  abnormalities are seen.  IMPRESSION: Unremarkable noncontrast CT of the head.  Original Report Authenticated By: Tonia Ghent, M.D.   US Ob Comp Less 14 Wks  05/04/2011  *RADIOLOGY REPORT*  Clinical Data: Mild right lower quadrant abdominal pain.  Positive pregnancy test, status post tubal ligation.  Assess for ectopic pregnancy.  OBSTETRIC <14 WK [place] AND TRANSVAGINAL OB US  Technique:  Both transabdominal and transvaginal ultrasound examinations were performed for complete evaluation of the gestation as well as the maternal uterus, adnexal regions, and pelvic cul-de-sac.  Transvaginal technique was performed to assess early pregnancy.  Comparison:  Pelvic ultrasound performed 10/31/2010  Intrauterine gestational sac:  Visualized/normal in shape. Yolk sac: Yes Embryo: Yes Cardiac Activity: No  CRL: 15.3   mm  7   w  6   d        [place] EDC: 12/15/2011  Maternal uterus/adnexae: No subchorionic hemorrhage is seen.  A small amount of fluid is noted along the lower uterine segment.  The uterus is otherwise unremarkable in appearance.  The ovaries are unremarkable in appearance.  The right ovary measures 4.9 x 3.8 x 2.3 cm, while the left ovary measures 3.2 x 1.7 x 2.9 cm.  No suspicious adnexal masses are seen.  There is no evidence for ovarian torsion.  Limited color Doppler evaluation demonstrates normal color Doppler blood flow with respect to both ovaries.  No free fluid is seen within the pelvic cul-de-sac.  IMPRESSION: Single intrauterine pregnancy noted, with a crown-rump length of 15.3 mm, corresponding to a gestational age of [redacted] weeks 6 days.  No fetal heartbeat is identified.  This most likely reflects fetal demise; would follow up quantitative beta HCG levels and confirm that they are trending downward.  No evidence of ectopic pregnancy.  Original Report Authenticated By: Tonia Ghent, M.D.   US Ob Transvaginal  05/04/2011  *RADIOLOGY REPORT*  Clinical Data: Mild right lower quadrant abdominal pain.   Positive pregnancy test, status post tubal ligation.  Assess for ectopic pregnancy.  OBSTETRIC <14 WK [place] AND TRANSVAGINAL OB US  Technique:  Both transabdominal and transvaginal ultrasound examinations were performed for complete evaluation of the gestation as well as the maternal uterus, adnexal regions, and pelvic cul-de-sac.  Transvaginal technique was performed to assess early pregnancy.  Comparison:  Pelvic ultrasound performed 10/31/2010  Intrauterine gestational sac:  Visualized/normal in shape. Yolk sac: Yes Embryo: Yes Cardiac Activity: No  CRL: 15.3   mm  7   w  6   d        [place] EDC: 12/15/2011  Maternal uterus/adnexae: No subchorionic hemorrhage is seen.  A small amount of fluid is noted along the lower uterine segment.  The uterus is otherwise unremarkable in  appearance.  The ovaries are unremarkable in appearance.  The right ovary measures 4.9 x 3.8 x 2.3 cm, while the left ovary measures 3.2 x 1.7 x 2.9 cm.  No suspicious adnexal masses are seen.  There is no evidence for ovarian torsion.  Limited color Doppler evaluation demonstrates normal color Doppler blood flow with respect to both ovaries.  No free fluid is seen within the pelvic cul-de-sac.  IMPRESSION: Single intrauterine pregnancy noted, with a crown-rump length of 15.3 mm, corresponding to a gestational age of [redacted] weeks 6 days.  No fetal heartbeat is identified.  This most likely reflects fetal demise; would follow up quantitative beta HCG levels and confirm that they are trending downward.  No evidence of ectopic pregnancy.  Original Report Authenticated By: Tonia Ghent, M.D.   Results for orders placed during the hospital encounter of 05/05/11 (from the past 24 hour(s))  CBC     Status: Abnormal   Collection Time   05/05/11 11:33 PM      Component Value Range   WBC 11.6 (*) 4.0 - 10.5 (K/uL)   RBC 4.87  3.87 - 5.11 (MIL/uL)   Hemoglobin 14.7  12.0 - 15.0 (g/dL)   HCT 11.9  14.7 - 82.9 (%)   MCV 86.9  78.0 - 100.0 (fL)   MCH 30.2   26.0 - 34.0 (pg)   MCHC 34.8  30.0 - 36.0 (g/dL)   RDW 56.2  13.0 - 86.5 (%)   Platelets 164  150 - 400 (K/uL)  RH IG WORKUP, [facility]     Status: Normal (Preliminary result)   Collection Time   05/05/11 11:33 PM      Component Value Range   Gestational Age(Wks) 8     ABO/RH(D) B NEG     Antibody Screen PENDING    HCG, QUANTITATIVE, PREGNANCY     Status: Abnormal   Collection Time   05/05/11 11:34 PM      Component Value Range   hCG, Beta Chain, Quant, S 267 (*) <5 (mIU/mL)     Assessment and Plan  Incomplete AB: discussed with pt at length. Discussed cytotec vs expectant management. Pt desires cytotec. Will give Rx for cytotec, lortab, and phenergan. Pt is aware of potential side effects including heavy vag bleeding and has been given instructions and precautions. Pt will return in 2wks for f/u. Discussed diet, activity, risks,and precautions.  Clinton Gallant. Rice III, DrHSc, MPAS, PA-C  05/05/2011, 11:45 PM   Henrietta Hoover, PA 05/06/11 0016

## 2011-05-05 NOTE — Progress Notes (Signed)
Pt seen at [facility] 10/21,+UPT,  Hx BTL 2004.  Pt having light pink bleeding that started tonight and cramping x1 wk.

## 2011-05-06 ENCOUNTER — Telehealth: Payer: Self-pay | Admitting: *Deleted

## 2011-05-06 MED ORDER — MISOPROSTOL 200 MCG PO TABS: ORAL_TABLET | ORAL | Status: DC

## 2011-05-06 MED ORDER — PROMETHAZINE HCL 25 MG PO TABS: 25.0000 mg | ORAL_TABLET | Freq: Four times a day (QID) | ORAL | Status: AC | PRN

## 2011-05-06 NOTE — Telephone Encounter (Signed)
Pt left message stating that she has many concerns about her pregnancy. She had BTL in 01/2007 and now is pregnant. She wants to be sure everything is OK. She was seen @ MAU on 10/22.  Also she is Rh neg and not sure how far along she is - wants to be sure she can get Rhogam in time. Please call back.

## 2011-05-07 LAB — RH IG WORKUP (INCLUDES ABO/RH): Unit division: 0

## 2011-05-07 NOTE — Telephone Encounter (Signed)
Spoke with patient. She is in the process of having a SAB. She hasn't taken the cytotec yet because she wanted to make sure the baby really was dead. I reviewed ultrasound with the patient as well as her Beta HCG. She would like to know what the chances are that she will become pregnant again. I advised her that I couldn't answer that question and she would need to discuss that with the MD she sees at her appt on 05/21/11 at 230. Pt agrees, she will take the cytotec as advised in MAU.

## 2011-05-13 ENCOUNTER — Inpatient Hospital Stay (HOSPITAL_COMMUNITY)
Admission: AD | Admit: 2011-05-13 | Discharge: 2011-05-13 | Disposition: A | Discharge status: Home / Self Care | Payer: Medicaid Other | Source: Ambulatory Visit | Attending: Obstetrics & Gynecology | Admitting: Obstetrics & Gynecology

## 2011-05-13 ENCOUNTER — Encounter (HOSPITAL_COMMUNITY): Payer: Self-pay | Admitting: *Deleted

## 2011-05-13 DIAGNOSIS — IMO0002 Reserved for concepts with insufficient information to code with codable children: Secondary | ICD-10-CM

## 2011-05-13 DIAGNOSIS — R109 Unspecified abdominal pain: Secondary | ICD-10-CM | POA: Insufficient documentation

## 2011-05-13 DIAGNOSIS — O021 Missed abortion: Secondary | ICD-10-CM

## 2011-05-13 HISTORY — DX: Osteochondrosis (juvenile) of carpal lunate (kienbock), unspecified hand: M92.219

## 2011-05-13 HISTORY — DX: Other chronic pain: G89.29

## 2011-05-13 HISTORY — DX: Dorsalgia, unspecified: M54.9

## 2011-05-13 MED ORDER — PROMETHAZINE HCL 25 MG/ML IJ SOLN
25.0000 mg | Freq: Once | INTRAMUSCULAR | Status: AC
  Administered 2011-05-13: 25 mg via INTRAMUSCULAR
  Filled 2011-05-13: qty 1

## 2011-05-13 MED ORDER — HYDROMORPHONE HCL 1 MG/ML IJ SOLN: 1.0000 mg | Freq: Once | INTRAMUSCULAR | Status: DC

## 2011-05-13 MED ORDER — HYDROMORPHONE HCL 2 MG/ML IJ SOLN: 2.0000 mg | Freq: Once | INTRAMUSCULAR | Status: DC

## 2011-05-13 MED ORDER — HYDROMORPHONE HCL 2 MG/ML IJ SOLN
2.0000 mg | Freq: Once | INTRAMUSCULAR | Status: AC
  Administered 2011-05-13: 2 mg via INTRAMUSCULAR

## 2011-05-13 NOTE — Progress Notes (Signed)
Pt states she took "the pills" because she had not started bleeding and nothing was happening. Pt states she was told that she would not have any pain and it would be over in an hour.

## 2011-05-13 NOTE — Progress Notes (Signed)
Pt continues to complain about pain. Pt states she  Is sleepy, but states she still has pain when she falls asleep.

## 2011-05-13 NOTE — ED Provider Notes (Signed)
Leslie Lade Otero-Hernandez24 y.E.[id number] @[redacted]w[redacted]d  Chief Complaint  Patient presents with  . Abdominal Pain    SUBJECTIVE  HPI: Initially seen at [facility] ED for headache, noted to have pos UPT and had US showing embryo c/w [redacted]w[redacted]d with no heartbeat and quant of 267.  Next seen here 05/05/11 and was given cytotec which she did not use until today at 1500 which is when she last ate. Had Rhophylac 05/05/11. She still has had no bleeding but presents with severe abd cramping unresponsive to Percocet, but took only 1/2 tab about 3 hr ago. Now is requesting D&C.  Past Medical History  Diagnosis Date  . Asthma   . Chronic back pain   . Kienbock's disease     Past Surgical History  Procedure Date  . Cesarean section   . Dilation and curettage of uterus   . Hernia repair   . Choley gall bladder  . Cholecystectomy    History   Social History  . Marital Status: Legally Separated    Spouse Name: N/A    Number of Children: N/A  . Years of Education: N/A   Occupational History  . Not on file.   Social History Main Topics  . Smoking status: Current Everyday Smoker -- 1.0 packs/day  . Smokeless tobacco: Never Used  . Alcohol Use: No  . Drug Use: No  . Sexually Active: Yes   Other Topics Concern  . Not on file   Social History Narrative  . No narrative on file   No current facility-administered medications on file prior to encounter.   Current Outpatient Prescriptions on File Prior to Encounter  Medication Sig Dispense Refill  . misoprostol (CYTOTEC) 200 MCG tablet Take all four tabs by mouth as a single dose.  4 tablet  0  . oxyCODONE-acetaminophen (PERCOCET) 10-325 MG per tablet Take 1 tablet by mouth every 4 (four) hours as needed. For pain       . prenatal vitamin w/FE, FA (PRENATAL 1 + 1) 27-1 MG TABS Take 1 tablet by mouth daily.        . promethazine (PHENERGAN) 25 MG tablet Take 1 tablet (25 mg total) by mouth every 6 (six) hours as needed for nausea.  30 tablet  0   Allergies    Allergen Reactions  . Bee Venom Anaphylaxis  . Ultram (Tramadol Hcl) Anaphylaxis and Hives    ROS: Pertinent items in HPI  OBJECTIVE  BP 146/96  Pulse 94  Temp(Src) 97.6 F (36.4 C) (Oral)  Resp 20  LMP 02/19/2011   Physical Exam  Constitutional: She is oriented to person, place, and time. She appears distressed.  HENT:  Head: Normocephalic.  Eyes: Pupils are equal, round, and reactive to light.  Neck: Neck supple.  Abdominal: Soft. She exhibits no distension and no mass. There is tenderness. There is no rebound and no guarding.  Genitourinary: Cervix normal.       Scant pink blood. Nullip cx, os closed, cx long  Musculoskeletal: Normal range of motion.  Neurological: She is alert and oriented to person, place, and time.  Skin: Skin is warm and dry.    MAU Course: D/W Dr. Marice Potter. Dilaudid/Phenergan given.  ASSESSMENT  Fetal demise 8 wk size. Abd pain due to start of cytotec induced SAB    PLAN  Explained in detail what to expect in terms of cramping and bleeding. Advised to take Percocets 1/2 q15 min x 4, reeat in 4-6 hrs  if she cannot tolerate taking a  whole pill. SAB precautions given. F/U prn or 6 wks at [facility].

## 2011-05-18 ENCOUNTER — Inpatient Hospital Stay (HOSPITAL_COMMUNITY): Payer: Medicaid Other

## 2011-05-18 ENCOUNTER — Inpatient Hospital Stay (HOSPITAL_COMMUNITY)
Admission: AD | Admit: 2011-05-18 | Discharge: 2011-05-18 | Disposition: A | Discharge status: Home / Self Care | Payer: Medicaid Other | Source: Ambulatory Visit | Attending: Obstetrics and Gynecology | Admitting: Obstetrics and Gynecology

## 2011-05-18 DIAGNOSIS — O034 Incomplete spontaneous abortion without complication: Secondary | ICD-10-CM | POA: Insufficient documentation

## 2011-05-18 LAB — CBC
MCH: 29.8 pg (ref 26.0–34.0)
MCHC: 34.8 g/dL (ref 30.0–36.0)
Platelets: 160 10*3/uL (ref 150–400)
RDW: 12.3 % (ref 11.5–15.5)

## 2011-05-18 MED ORDER — HYDROMORPHONE HCL PF 1 MG/ML IJ SOLN
2.0000 mg | Freq: Once | INTRAMUSCULAR | Status: AC
  Administered 2011-05-18: 2 mg via INTRAMUSCULAR
  Filled 2011-05-18: qty 2

## 2011-05-18 MED ORDER — MISOPROSTOL 200 MCG PO TABS
800.0000 ug | ORAL_TABLET | Freq: Once | ORAL | Status: AC
  Administered 2011-05-18: 800 ug via VAGINAL
  Filled 2011-05-18: qty 4

## 2011-05-18 MED ORDER — KETOROLAC TROMETHAMINE 60 MG/2ML IM SOLN
60.0000 mg | Freq: Once | INTRAMUSCULAR | Status: AC
  Administered 2011-05-18: 60 mg via INTRAMUSCULAR
  Filled 2011-05-18: qty 2

## 2011-05-18 NOTE — Progress Notes (Signed)
Was told having a miscarriage and given Cytotec did not take until Tuesday has been having heavy bleeding, pain and weakness, taking Percocet 2 every 4 hours not helping with the pain, bleed heavy Tuesday and Wednesday, on Friday had dark brown was almost completely gone, yesterday cramping and bleeding heavy since yesterday.

## 2011-05-18 NOTE — ED Provider Notes (Signed)
History     Chief Complaint  Patient presents with  . Vaginal Bleeding  . Abdominal Pain   HPI [name] 25 y.o. had cytotec orally on 05-14-11 for fetal demise at 7w 6d.  Has had bleeding and cramping since then.  Has been taking percocet PO for pain and continues to have pain and bleeding.  Wants the bleeding to stop.  Had Rhophylac on 05-05-11.   OB History    Grav Para Term Preterm Abortions TAB SAB Ect Mult Living   10 4 4  5  5   4       Past Medical History  Diagnosis Date  . Asthma   . Chronic back pain   . Kienbock's disease     Past Surgical History  Procedure Date  . Cesarean section   . Dilation and curettage of uterus   . Hernia repair   . Choley gall bladder  . Cholecystectomy     No family history on file.  History  Substance Use Topics  . Smoking status: Current Everyday Smoker -- 1.0 packs/day  . Smokeless tobacco: Never Used  . Alcohol Use: No    Allergies:  Allergies  Allergen Reactions  . Bee Venom Anaphylaxis  . Ultram (Tramadol Hcl) Anaphylaxis and Hives  . Magnesium-Containing Compounds Hives    Prescriptions prior to admission  Medication Sig Dispense Refill  . misoprostol (CYTOTEC) 200 MCG tablet Take 800 mcg by mouth once. Took on Tuesday 05/13/11       . oxyCODONE-acetaminophen (PERCOCET) 10-325 MG per tablet Take 2 tablets by mouth every 4 (four) hours as needed. For pain      . prenatal vitamin w/FE, FA (PRENATAL 1 + 1) 27-1 MG TABS Take 1 tablet by mouth daily.        . promethazine (PHENERGAN) 25 MG tablet Take 25 mg by mouth every 6 (six) hours as needed. nausea         Review of Systems  Gastrointestinal: Positive for abdominal pain.  Genitourinary:       Vaginal bleeding   Physical Exam   Blood pressure 126/76, pulse 88, temperature 98.1 F (36.7 C), temperature source Oral, resp. rate 20, height 5\' 6"  (1.676 m), weight 229 lb 12.8 oz (104.237 kg), last menstrual period 02/19/2011.  Physical Exam    Nursing note and vitals reviewed. Constitutional: She is oriented to person, place, and time. She appears well-developed and well-nourished.  HENT:  Head: Normocephalic.  Eyes: EOM are normal.  Neck: Neck supple.  GI: Soft. There is tenderness. There is no rebound and no guarding.       Mild tenderness in lower midline  Genitourinary:       Speculum exam: Vulva - small amount of dark blood noted Vagina - Small amount of dark blood,  Cervix - some bleeding seen oozing from  os Bimanual exam: Cervix barely open, barely Finger tip, thick Uterus tender, unable to size due to habitus Adnexa uncomfortable with all of bimanual exam, GC/Chlam, wet prep done Chaperone present for exam.  Musculoskeletal: Normal range of motion.  Neurological: She is alert and oriented to person, place, and time.  Skin: Skin is warm and dry.  Psychiatric: She has a normal mood and affect.    MAU Course  Procedures Results for orders placed during the hospital encounter of 05/18/11 (from the past 24 hour(s))  CBC     Status: Normal   Collection Time   05/18/11  4:58 PM  Component Value Range   WBC 10.0  4.0 - 10.5 (K/uL)   RBC 4.63  3.87 - 5.11 (MIL/uL)   Hemoglobin 13.8  12.0 - 15.0 (g/dL)   HCT 16.1  09.6 - 04.5 (%)   MCV 85.5  78.0 - 100.0 (fL)   MCH 29.8  26.0 - 34.0 (pg)   MCHC 34.8  30.0 - 36.0 (g/dL)   RDW 40.9  81.1 - 91.4 (%)   Platelets 160  150 - 400 (K/uL)  WET PREP, GENITAL     Status: Abnormal   Collection Time   05/18/11  5:58 PM      Component Value Range   Yeast, Wet Prep NONE SEEN  NONE SEEN    Trich, Wet Prep NONE SEEN  NONE SEEN    Clue Cells, Wet Prep NONE SEEN  NONE SEEN    WBC, Wet Prep HPF POC RARE (*) NONE SEEN    Given Toradol 60 mg IM for pain Continues to have pain after ultrasound Consult with Dr. Emelda Fear Discussed plan with client  - will repeat Cytotec vaginally and expect miscarriage to be completed in 12-24 hours. Will given Dilaudid 2 mg IM for pain  management with vaginal cytotec.  MDM Ultrasound Clinical Data: Fetal minus, status post Cytotec.  TRANSVAGINAL OB ULTRASOUND  Technique: Transvaginal ultrasound was performed for evaluation of  the gestation as well as the maternal uterus and adnexal regions.  Comparison: 05/04/2011  Findings: Endometrium is thickened measuring up to 28 mm in the  lower uterine segment. No blood flow within the endometrium. This  suggests blood products. Small amount of fluid. No intrauterine  gestation.  Right ovary not visualized. Left ovary normal size and  echotexture. No adnexal masses. No free fluid.  IMPRESSION:  Heterogeneous, thickened endometrium with internal hypoechoic  material. No blood flow seen within the endometrium suggesting  this material represents blood products. No intrauterine gestation  visualized.   Assessment and Plan  Incomplete miscarriage  Plan: Cytotec 800 mcg vaginally diluadid 2 mg Im for pain Message sent to GYN clinic to make sure appointment is scheduled.  Cleotis Sparr 05/18/2011, 6:18 PM   Nolene Bernheim, NP 05/18/11 [id number]

## 2011-05-19 LAB — GC/CHLAMYDIA PROBE AMP, GENITAL
Chlamydia, DNA Probe: NEGATIVE
GC Probe Amp, Genital: NEGATIVE

## 2011-05-19 NOTE — ED Provider Notes (Signed)
Attestation of Attending Supervision of Advanced Practitioner: Evaluation and management procedures were performed by the PA/NP/CNM/OB Fellow under my supervision/collaboration. Chart reviewed and agree with management and plan.  Mikle Sternberg V 05/19/2011 7:29 PM

## 2011-05-21 ENCOUNTER — Other Ambulatory Visit: Payer: Self-pay | Admitting: Obstetrics & Gynecology

## 2011-05-21 ENCOUNTER — Encounter (HOSPITAL_COMMUNITY): Payer: Self-pay | Admitting: Anesthesiology

## 2011-05-21 ENCOUNTER — Ambulatory Visit (HOSPITAL_COMMUNITY)
Admission: AD | Admit: 2011-05-21 | Discharge: 2011-05-21 | Disposition: A | Discharge status: Home / Self Care | Payer: Medicaid Other | Source: Ambulatory Visit | Attending: Obstetrics & Gynecology | Admitting: Obstetrics & Gynecology

## 2011-05-21 ENCOUNTER — Inpatient Hospital Stay (HOSPITAL_COMMUNITY): Payer: Medicaid Other | Admitting: Anesthesiology

## 2011-05-21 ENCOUNTER — Ambulatory Visit (INDEPENDENT_AMBULATORY_CARE_PROVIDER_SITE_OTHER): Payer: Medicaid Other | Admitting: Obstetrics & Gynecology

## 2011-05-21 ENCOUNTER — Encounter (HOSPITAL_COMMUNITY)
Admission: AD | Disposition: A | Discharge status: Home / Self Care | Payer: Self-pay | Source: Ambulatory Visit | Attending: Obstetrics & Gynecology

## 2011-05-21 ENCOUNTER — Encounter: Payer: Self-pay | Admitting: Obstetrics & Gynecology

## 2011-05-21 ENCOUNTER — Ambulatory Visit (HOSPITAL_COMMUNITY)
Admission: RE | Admit: 2011-05-21 | Payer: Medicaid Other | Source: Ambulatory Visit | Admitting: Obstetrics & Gynecology

## 2011-05-21 VITALS — BP 146/102 | HR 100 | Temp 97.0°F | Ht 66.0 in | Wt 228.0 lb

## 2011-05-21 DIAGNOSIS — O039 Complete or unspecified spontaneous abortion without complication: Secondary | ICD-10-CM

## 2011-05-21 DIAGNOSIS — Z9889 Other specified postprocedural states: Secondary | ICD-10-CM

## 2011-05-21 DIAGNOSIS — O021 Missed abortion: Secondary | ICD-10-CM | POA: Insufficient documentation

## 2011-05-21 DIAGNOSIS — O034 Incomplete spontaneous abortion without complication: Secondary | ICD-10-CM

## 2011-05-21 HISTORY — PX: DILATION AND EVACUATION: SHX1459

## 2011-05-21 LAB — CBC
HCT: 37.5 % (ref 36.0–46.0)
Hemoglobin: 13 g/dL (ref 12.0–15.0)
RDW: 12.4 % (ref 11.5–15.5)
WBC: 10.7 10*3/uL — ABNORMAL HIGH (ref 4.0–10.5)

## 2011-05-21 SURGERY — DILATION AND EVACUATION, UTERUS
Anesthesia: Monitor Anesthesia Care

## 2011-05-21 MED ORDER — FAMOTIDINE IN NACL 20-0.9 MG/50ML-% IV SOLN
20.0000 mg | Freq: Once | INTRAVENOUS | Status: AC
  Administered 2011-05-21: 20 mg via INTRAVENOUS
  Filled 2011-05-21: qty 50

## 2011-05-21 MED ORDER — PROPOFOL 10 MG/ML IV EMUL
INTRAVENOUS | Status: AC
  Filled 2011-05-21: qty 20

## 2011-05-21 MED ORDER — HYDROMORPHONE HCL PF 1 MG/ML IJ SOLN
INTRAMUSCULAR | Status: AC
  Filled 2011-05-21: qty 1

## 2011-05-21 MED ORDER — CITRIC ACID-SODIUM CITRATE 334-500 MG/5ML PO SOLN
ORAL | Status: AC
  Administered 2011-05-21: 18:00:00
  Filled 2011-05-21: qty 15

## 2011-05-21 MED ORDER — FENTANYL CITRATE 0.05 MG/ML IJ SOLN
INTRAMUSCULAR | Status: AC
  Administered 2011-05-21: 50 ug via INTRAVENOUS
  Filled 2011-05-21: qty 2

## 2011-05-21 MED ORDER — KETOROLAC TROMETHAMINE 10 MG PO TABS: 10.0000 mg | ORAL_TABLET | Freq: Three times a day (TID) | ORAL | Status: AC | PRN

## 2011-05-21 MED ORDER — METHYLERGONOVINE MALEATE 0.2 MG/ML IJ SOLN
INTRAMUSCULAR | Status: AC
  Filled 2011-05-21: qty 1

## 2011-05-21 MED ORDER — MIDAZOLAM HCL 2 MG/2ML IJ SOLN
INTRAMUSCULAR | Status: AC
  Filled 2011-05-21: qty 2

## 2011-05-21 MED ORDER — LIDOCAINE HCL (CARDIAC) 20 MG/ML IV SOLN
INTRAVENOUS | Status: AC
  Filled 2011-05-21: qty 5

## 2011-05-21 MED ORDER — HYDROMORPHONE HCL PF 1 MG/ML IJ SOLN
0.5000 mg | INTRAMUSCULAR | Status: AC
  Administered 2011-05-21 (×3): 0.5 mg via INTRAVENOUS

## 2011-05-21 MED ORDER — LACTATED RINGERS IV SOLN
INTRAVENOUS | Status: DC
  Administered 2011-05-21: 1000 mL via INTRAVENOUS
  Administered 2011-05-21: 18:00:00 via INTRAVENOUS

## 2011-05-21 MED ORDER — FENTANYL CITRATE 0.05 MG/ML IJ SOLN
INTRAMUSCULAR | Status: AC
  Filled 2011-05-21: qty 2

## 2011-05-21 MED ORDER — METHYLERGONOVINE MALEATE 0.2 MG PO TABS: 0.2000 mg | ORAL_TABLET | Freq: Four times a day (QID) | ORAL | Status: DC

## 2011-05-21 MED ORDER — METHYLERGONOVINE MALEATE 0.2 MG/ML IJ SOLN
INTRAMUSCULAR | Status: DC | PRN
  Administered 2011-05-21: 0.2 mg via INTRAMUSCULAR

## 2011-05-21 MED ORDER — FENTANYL CITRATE 0.05 MG/ML IJ SOLN
INTRAMUSCULAR | Status: DC | PRN
  Administered 2011-05-21 (×2): 50 ug via INTRAVENOUS

## 2011-05-21 MED ORDER — METOCLOPRAMIDE HCL 5 MG/ML IJ SOLN
10.0000 mg | Freq: Once | INTRAMUSCULAR | Status: DC | PRN
Start: 2011-05-21 — End: 2011-05-21

## 2011-05-21 MED ORDER — ONDANSETRON HCL 4 MG/2ML IJ SOLN
INTRAMUSCULAR | Status: AC
  Filled 2011-05-21: qty 2

## 2011-05-21 MED ORDER — PROPOFOL 10 MG/ML IV EMUL
INTRAVENOUS | Status: DC | PRN
  Administered 2011-05-21: 40 mg via INTRAVENOUS
  Administered 2011-05-21 (×10): 20 mg via INTRAVENOUS

## 2011-05-21 MED ORDER — BUPIVACAINE HCL 0.5 % IJ SOLN
INTRAMUSCULAR | Status: DC | PRN
  Administered 2011-05-21: 20 mL

## 2011-05-21 MED ORDER — CEFAZOLIN SODIUM 1-5 GM-% IV SOLN
1.0000 g | Freq: Once | INTRAVENOUS | Status: AC
  Administered 2011-05-21: 1 g via INTRAVENOUS
  Filled 2011-05-21: qty 50

## 2011-05-21 MED ORDER — FENTANYL CITRATE 0.05 MG/ML IJ SOLN
25.0000 ug | INTRAMUSCULAR | Status: DC | PRN
  Administered 2011-05-21 (×2): 50 ug via INTRAVENOUS

## 2011-05-21 MED ORDER — HYDROMORPHONE HCL PF 1 MG/ML IJ SOLN
1.0000 mg | Freq: Once | INTRAMUSCULAR | Status: AC
  Administered 2011-05-21: 1 mg via INTRAVENOUS
  Filled 2011-05-21: qty 1

## 2011-05-21 MED ORDER — MIDAZOLAM HCL 5 MG/5ML IJ SOLN
INTRAMUSCULAR | Status: DC | PRN
  Administered 2011-05-21: 2 mg via INTRAVENOUS

## 2011-05-21 MED ORDER — ONDANSETRON HCL 4 MG/2ML IJ SOLN
INTRAMUSCULAR | Status: DC | PRN
  Administered 2011-05-21: 4 mg via INTRAVENOUS

## 2011-05-21 SURGICAL SUPPLY — 18 items
CATH ROBINSON RED A/P 16FR (CATHETERS) ×2 IMPLANT
CLOTH BEACON ORANGE TIMEOUT ST (SAFETY) ×2 IMPLANT
DECANTER SPIKE VIAL GLASS SM (MISCELLANEOUS) ×2 IMPLANT
GLOVE BIOGEL PI IND STRL 8 (GLOVE) ×2 IMPLANT
GLOVE BIOGEL PI INDICATOR 8 (GLOVE) ×2
GLOVE ECLIPSE 8.0 STRL XLNG CF (GLOVE) ×2 IMPLANT
KIT BERKELEY 1ST TRIMESTER 3/8 (MISCELLANEOUS) ×2 IMPLANT
NEEDLE SPNL 22GX3.5 QUINCKE BK (NEEDLE) ×2 IMPLANT
NS IRRIG 1000ML POUR BTL (IV SOLUTION) ×2 IMPLANT
PACK VAGINAL MINOR WOMEN LF (CUSTOM PROCEDURE TRAY) ×2 IMPLANT
PAD PREP 24X48 CUFFED NSTRL (MISCELLANEOUS) ×2 IMPLANT
SET BERKELEY SUCTION TUBING (SUCTIONS) ×2 IMPLANT
SYR CONTROL 10ML LL (SYRINGE) ×2 IMPLANT
TOWEL OR 17X24 6PK STRL BLUE (TOWEL DISPOSABLE) ×4 IMPLANT
VACURETTE 10 RIGID CVD (CANNULA) IMPLANT
VACURETTE 7MM CVD STRL WRAP (CANNULA) IMPLANT
VACURETTE 8 RIGID CVD (CANNULA) ×2 IMPLANT
VACURETTE 9 RIGID CVD (CANNULA) IMPLANT

## 2011-05-21 NOTE — Anesthesia Postprocedure Evaluation (Signed)
Anesthesia Post Note  Patient: [name]  Procedure(s) Performed:  DILATATION AND EVACUATION (D&E)  Anesthesia type: MAC  Patient location: PACU  Post pain: Pain level controlled  Post assessment: Post-op Vital signs reviewed  Last Vitals:  Filed Vitals:   05/21/11 1900  BP:   Pulse:   Temp:   Resp: 24    Post vital signs: Reviewed  Level of consciousness: sedated  Complications: No apparent anesthesia complications

## 2011-05-21 NOTE — H&P (Signed)
Chief Complaint   Patient presents with   .  Vaginal Bleeding   .  Abdominal Pain   HPI  [name] 25 y.o. had cytotec orally on 05-14-11 for fetal demise at 7w 6d. Has had bleeding and cramping since then. Has been taking percocet PO for pain and continues to have pain and bleeding. Wants the bleeding to stop. Had Rhophylac on 05-05-11. She had vaginal cytotec 05/18/11 and she still has pain bleeding, no tissue. She last ate 1000 today, some nausea, consents to D&C, procedure and risk of bleeding, infection, uterine damage, anesthesia discussed. Dr. Despina Hidden aware. OB History    Grav  Para  Term  Preterm  Abortions  TAB  SAB  Ect  Mult  Living    10  4  4   5   5    4       Past Medical History   Diagnosis  Date   .  Asthma    .  Chronic back pain    .  Kienbock's disease     Past Surgical History   Procedure  Date   .  Cesarean section    .  Dilation and curettage of uterus    .  Hernia repair    .  Choley  gall bladder   .  Cholecystectomy    No family history on file.  History   Substance Use Topics   .  Smoking status:  Current Everyday Smoker -- 1.0 packs/day   .  Smokeless tobacco:  Never Used   .  Alcohol Use:  No   Allergies:  Allergies   Allergen  Reactions   .  Bee Venom  Anaphylaxis   .  Ultram (Tramadol Hcl)  Anaphylaxis and Hives   .  Magnesium-Containing Compounds  Hives    Prescriptions prior to admission   Medication  Sig  Dispense  Refill   .  misoprostol (CYTOTEC) 200 MCG tablet  Take 800 mcg by mouth once. Took on Tuesday 05/13/11     .  oxyCODONE-acetaminophen (PERCOCET) 10-325 MG per tablet  Take 2 tablets by mouth every 4 (four) hours as needed. For pain     .  prenatal vitamin w/FE, FA (PRENATAL 1 + 1) 27-1 MG TABS  Take 1 tablet by mouth daily.     .  promethazine (PHENERGAN) 25 MG tablet  Take 25 mg by mouth every 6 (six) hours as needed. nausea     Review of Systems  Gastrointestinal: Positive for abdominal pain.  Genitourinary:    Vaginal bleeding  Physical Exam   Blood pressure 126/76, pulse 88, temperature 98.1 F (36.7 C), temperature source Oral, resp. rate 20, height 5\' 6"  (1.676 m), weight 229 lb 12.8 oz (104.237 kg), last menstrual period 02/19/2011.  Physical Exam  Nursing note and vitals reviewed.  Constitutional: She is oriented to person, place, and time. She appears well-developed and well-nourished.  HENT:  Head: Normocephalic.  Eyes: EOM are normal.  Neck: Neck supple.  GI: Soft. There is tenderness. There is no rebound and no guarding.  Mild tenderness in lower midline  Genitourinary:  Speculum exam: Vulva - small amount of dark blood noted Vagina - Small amount of dark blood,  Cervix - some bleeding seen oozing from os Bimanual exam: Cervix barely open, barely Finger tip, thick Uterus tender, unable to size due to habitus Adnexa uncomfortable with all of bimanual exam, GC/Chlam, wet prep done Chaperone present for exam. Findings confirmed 05/21/11  Musculoskeletal: Normal range of motion.  Neurological: She is alert and oriented to person, place, and time.  Skin: Skin is warm and dry.  Psychiatric: She has a normal mood and affect. Uncomfortable MAU Course   Procedures  Results for orders placed during the hospital encounter of 05/18/11 (from the past 24 hour(s))   CBC Status: Normal    Collection Time    05/18/11 4:58 PM   Component  Value  Range    WBC  10.0  4.0 - 10.5 (K/uL)    RBC  4.63  3.87 - 5.11 (MIL/uL)    Hemoglobin  13.8  12.0 - 15.0 (g/dL)    HCT  16.1  09.6 - 04.5 (%)    MCV  85.5  78.0 - 100.0 (fL)    MCH  29.8  26.0 - 34.0 (pg)    MCHC  34.8  30.0 - 36.0 (g/dL)    RDW  40.9  81.1 - 91.4 (%)    Platelets  160  150 - 400 (K/uL)   WET PREP, GENITAL Status: Abnormal    Collection Time    05/18/11 5:58 PM   Component  Value  Range    Yeast, Wet Prep  NONE SEEN  NONE SEEN    Trich, Wet Prep  NONE SEEN  NONE SEEN    Clue Cells, Wet Prep  NONE SEEN  NONE SEEN    WBC, Wet  Prep HPF POC  RARE (*)  NONE SEEN   Given Toradol 60 mg IM for pain  Continues to have pain after ultrasound  Consult with Dr. Emelda Fear  Discussed plan with client - will repeat Cytotec vaginally and expect miscarriage to be completed in 12-24 hours.  Will given Dilaudid 2 mg IM for pain management with vaginal cytotec.  MDM  Ultrasound  Clinical Data: Fetal minus, status post Cytotec.  TRANSVAGINAL OB ULTRASOUND  Technique: Transvaginal ultrasound was performed for evaluation of  the gestation as well as the maternal uterus and adnexal regions.  Comparison: 05/04/2011  Findings: Endometrium is thickened measuring up to 28 mm in the  lower uterine segment. No blood flow within the endometrium. This  suggests blood products. Small amount of fluid. No intrauterine  gestation.  Right ovary not visualized. Left ovary normal size and  echotexture. No adnexal masses. No free fluid.  IMPRESSION:  Heterogeneous, thickened endometrium with internal hypoechoic  material. No blood flow seen within the endometrium suggesting  this material represents blood products. No intrauterine gestation  visualized.  Assessment and Plan   Incomplete miscarriage  Plan:Schedule for dilation and curettage when NPO 8 hrs today by Dr. Despina Hidden. B neg, received Rhogam 05/05/11 H/O 4 cesareans, BTL [place]  05/21/11

## 2011-05-21 NOTE — Anesthesia Preprocedure Evaluation (Signed)
Anesthesia Evaluation  Patient identified by MRN, date of birth, ID band Patient awake    Reviewed: Allergy & Precautions, H&P , NPO status   Airway Mallampati: III TM Distance: >3 FB Neck ROM: Full    Dental No notable dental hx. (+) Teeth Intact Pierced Tongue:   Pulmonary asthma , Current Smoker,  Exercise induced Asthma Smokes 1/2 ppd clear to auscultation  Pulmonary exam normal       Cardiovascular neg cardio ROS Regular Normal    Neuro/Psych Negative Neurological ROS  Negative Psych ROS   GI/Hepatic negative GI ROS, Neg liver ROS,   Endo/Other  Negative Endocrine ROS  Renal/GU negative Renal ROS  Genitourinary negative   Musculoskeletal negative musculoskeletal ROS (+)   Abdominal (+) obese,   Peds  Hematology negative hematology ROS (+)   Anesthesia Other Findings   Reproductive/Obstetrics negative OB ROS                           Anesthesia Physical Anesthesia Plan  ASA: II and Emergent  Anesthesia Plan: MAC   Post-op Pain Management:    Induction: Intravenous  Airway Management Planned:   Additional Equipment:   Intra-op Plan:   Post-operative Plan:   Informed Consent:   Dental advisory given  Plan Discussed with: CRNA, Anesthesiologist and Surgeon  Anesthesia Plan Comments:         Anesthesia Quick Evaluation

## 2011-05-21 NOTE — Transfer of Care (Signed)
Immediate Anesthesia Transfer of Care Note  Patient: [name]  Procedure(s) Performed:  DILATATION AND EVACUATION (D&E)  Patient Location: PACU  Anesthesia Type: MAC  Level of Consciousness: awake, alert  and sedated  Airway & Oxygen Therapy: Patient Spontanous Breathing  Post-op Assessment: Report given to PACU RN and Post -op Vital signs reviewed and stable  Post vital signs: Reviewed and stable  Complications: No apparent anesthesia complications

## 2011-05-21 NOTE — ED Provider Notes (Signed)
Sent to MAU from GYN clinic today.  Scheduled for D&C at 7 pm today.  Here in MAU to prep for surgery.  See H&P note from Dr. Debroah Loop.  Nolene Bernheim, NP 05/21/11 1555

## 2011-05-21 NOTE — Progress Notes (Signed)
Patient states found out was pregnant at [facility] 05/04/11, ( sent there for CT scan because of dizziness/headaches), then on 05/05/11 started spotting came to [facility] - said was miscarriage, they gave her prescription for cytotec- patient filled and took prescription for cytotec 4 tabs po on 05/13/11.  Then went to hospital that night because of pain - was sent home after dilaudid shot. They informed patient would take some time for miscarriage to complete.  Went back on 05/18/11 because still having bad cramping and bleeding- they gave her 4 cytotec in vagina- patient went home , states tablets came back out undissolved after about 15-16 hours,.. Patient states pain still just as bad today and bleeding is less.

## 2011-05-21 NOTE — Op Note (Signed)
Preoperative diagnosis:  Incomplete pregnancy loss in the first trimester  Postoperative diagnosis:  Same as above  Procedure:  Cervical dilation and uterine curettage, sharp and suction, paracervical block  Surgeon:  Rockne Coons MD   Anesthesia:  MAC and paracervical block  Findings:  The patient was known to have a first trimester pregnancy loss and was given oral Cytotec last week.  She passed the fetus but did not pass the decidual sac.  She was subsequently given vaginal Cytotec again without success.  She presented to clinic today and on evaluation it was decided that she would undergo a uterine curettage for definitive treatment.  Description of operation:  Patient was taken to the operating room and placed in the supine position where she underwent IV and mask anesthesia.  She was prepped and draped in the usual sterile fashion.  A Graves speculum was placed in the anterior cervix was grasped with a single tooth tenaculum. The cervix was dilated serially to allow passage of an 8 French curved curette.  A paracervical block was placed using a spinal needle and 20 cc total of 0.5% Marcaine plain.  A suction curettage was performed gently with several passes being made.  A gentle sharp curettage was then performed and good uterine crie was obtained in all areas.  A final pass with the suction curette was used and no tissue was returned. The patient was giving 0.2 mg of Methergine IM as an oxytoxic agent.  The uterus was firm and was not bleeding.  The patient was awoke waken from her anesthesia taken to the recovery room in good stable condition all counts were correct.  Blood loss was minimal.  She received 1 g of Ancef prophylactically perioperatively.   Lazaro Arms 6:39 PM 05/21/2011

## 2011-05-21 NOTE — Progress Notes (Signed)
LM MD WILL CALL BACK.

## 2011-05-21 NOTE — Progress Notes (Signed)
PT REQUESTING PAIN MEDICATION. INSTRUCTED TO CALL DR. FOSTER.

## 2011-05-22 ENCOUNTER — Encounter (HOSPITAL_COMMUNITY): Payer: Self-pay | Admitting: Obstetrics & Gynecology

## 2011-05-22 NOTE — ED Provider Notes (Signed)
Attestation of Attending Supervision of Advanced Practitioner: Evaluation and management procedures were performed by the PA/NP/CNM/OB Fellow under my supervision/collaboration. Chart reviewed, and agree with management and plan.  Tajuan Dufault A M.D. 05/22/2011 1:29 PM   

## 2011-06-18 ENCOUNTER — Ambulatory Visit (INDEPENDENT_AMBULATORY_CARE_PROVIDER_SITE_OTHER): Payer: Medicaid Other | Admitting: Obstetrics & Gynecology

## 2011-06-18 ENCOUNTER — Encounter: Payer: Self-pay | Admitting: Obstetrics & Gynecology

## 2011-06-18 VITALS — BP 140/98 | HR 85 | Temp 97.1°F | Wt 225.7 lb

## 2011-06-18 DIAGNOSIS — Z9889 Other specified postprocedural states: Secondary | ICD-10-CM

## 2011-06-18 DIAGNOSIS — O021 Missed abortion: Secondary | ICD-10-CM

## 2011-06-18 DIAGNOSIS — Z09 Encounter for follow-up examination after completed treatment for conditions other than malignant neoplasm: Secondary | ICD-10-CM

## 2011-06-18 LAB — POCT PREGNANCY, URINE: Preg Test, Ur: NEGATIVE

## 2011-06-18 NOTE — Progress Notes (Signed)
  Subjective:    Patient ID: [name], female    DOB: 1985/12/05, 25 y.o.   MRN: [id number]  HPI Ms. Patrcia Dolly is a 25 yo G7P4A3 with a history of a BTL who had a d&c for a incomplete ab. The pathology showed chorionic villi, so I have assured her that one of her tubes is open. She is very happy because she wants another pregnancy.  She will get more PNVs since I have explained the decreased risk of birth defects. She has already been having unprotected intercourse since d&c 2 weeks ago. She has no complaints.   Review of Systems     Objective:   Physical Exam  Normal cervix and bimanual exam      Assessment & Plan:  Post op- doing well. I have recommended PNV and I also told her to contact us ASAP when she gets a positive pregnancy test.  She will see her FP doctor at the [facility] today regarding her BP. I have also counseled her about weight loss.

## 2011-06-30 ENCOUNTER — Inpatient Hospital Stay (HOSPITAL_COMMUNITY)
Admission: AD | Admit: 2011-06-30 | Discharge: 2011-06-30 | Disposition: A | Discharge status: Home / Self Care | Payer: Medicaid Other | Source: Ambulatory Visit | Attending: Obstetrics & Gynecology | Admitting: Obstetrics & Gynecology

## 2011-06-30 ENCOUNTER — Encounter (HOSPITAL_COMMUNITY): Payer: Self-pay | Admitting: *Deleted

## 2011-06-30 ENCOUNTER — Inpatient Hospital Stay (HOSPITAL_COMMUNITY): Payer: Medicaid Other

## 2011-06-30 DIAGNOSIS — N898 Other specified noninflammatory disorders of vagina: Secondary | ICD-10-CM

## 2011-06-30 DIAGNOSIS — N949 Unspecified condition associated with female genital organs and menstrual cycle: Secondary | ICD-10-CM | POA: Insufficient documentation

## 2011-06-30 DIAGNOSIS — N938 Other specified abnormal uterine and vaginal bleeding: Secondary | ICD-10-CM | POA: Insufficient documentation

## 2011-06-30 DIAGNOSIS — N939 Abnormal uterine and vaginal bleeding, unspecified: Secondary | ICD-10-CM

## 2011-06-30 HISTORY — DX: Headache: R51

## 2011-06-30 HISTORY — DX: Trichomoniasis, unspecified: A59.9

## 2011-06-30 LAB — WET PREP, GENITAL
Trich, Wet Prep: NONE SEEN
WBC, Wet Prep HPF POC: NONE SEEN
Yeast Wet Prep HPF POC: NONE SEEN

## 2011-06-30 LAB — CBC
MCH: 29.9 pg (ref 26.0–34.0)
Platelets: 158 10*3/uL (ref 150–400)
RBC: 4.92 MIL/uL (ref 3.87–5.11)
WBC: 9.8 10*3/uL (ref 4.0–10.5)

## 2011-06-30 LAB — URINALYSIS, ROUTINE W REFLEX MICROSCOPIC
Bilirubin Urine: NEGATIVE
Glucose, UA: NEGATIVE mg/dL
Ketones, ur: NEGATIVE mg/dL
Leukocytes, UA: NEGATIVE
Nitrite: NEGATIVE
Protein, ur: NEGATIVE mg/dL
Specific Gravity, Urine: <1.005 — ABNORMAL LOW (ref 1.005–1.030)
Urobilinogen, UA: 0.2 mg/dL (ref 0.0–1.0)
pH: 6 (ref 5.0–8.0)

## 2011-06-30 LAB — URINE MICROSCOPIC-ADD ON

## 2011-06-30 NOTE — Progress Notes (Addendum)
Pt states had d&c 05/21/2011, still spotting post surgery. Having rlq pain, dull, intermittent. Denies abnormal vaginal d/c despite spotting. No blood on pad, just changed.

## 2011-06-30 NOTE — Progress Notes (Signed)
Had CT on 10/21- of the head;  Found out preg 10/21- did [place] ( at [facility]). Started spotting 10/22, came here,- "failed preg."  Was given cytotec, did not work.  Had D&C on 11/07. Started bleeding 12/03, has continued to bleed.  Spotting at times heavier.  Hx of tubal in2008.

## 2011-06-30 NOTE — ED Provider Notes (Signed)
History   Pt presents today c/o vag bleeding on and off since having a D&C on 05/21/11 for incomplete AB. She also c/o lower abd cramping. She denies fever, vag irritation, or heavy vag bleeding.  Chief Complaint  Patient presents with  . Vaginal Bleeding   HPI  OB History    Grav Para Term Preterm Abortions TAB SAB Ect Mult Living   7 4 4  3  3   4       Past Medical History  Diagnosis Date  . Chronic back pain   . Kienbock's disease   . Morbid obesity   . Gestational diabetes 2008    on insulin  . Headache   . Asthma     exercise induced  . Trichomonas     Past Surgical History  Procedure Date  . Cesarean section   . Dilation and curettage of uterus   . Hernia repair   . Choley gall bladder  . Cholecystectomy   . Tooth extraction     17 teeth extracted  . Dilation and evacuation 05/21/2011    Procedure: DILATATION AND EVACUATION (D&E);  Surgeon: Lazaro Arms, MD;  Location: [facility];  Service: Gynecology;  Laterality: N/A;  . Tubal ligation     Family History  Problem Relation Age of Onset  . Diabetes Mother   . Hypertension Brother   . Anesthesia problems Neg Hx     History  Substance Use Topics  . Smoking status: Current Everyday Smoker -- 1.0 packs/day for 17 years  . Smokeless tobacco: Never Used  . Alcohol Use: No    Allergies:  Allergies  Allergen Reactions  . Bee Venom Anaphylaxis  . Ultram (Tramadol Hcl) Anaphylaxis and Hives  . Magnesium-Containing Compounds Hives    Prescriptions prior to admission  Medication Sig Dispense Refill  . oxyCODONE-acetaminophen (PERCOCET) 10-325 MG per tablet Take 0.5 tablets by mouth every 4 (four) hours as needed. For pain      . prenatal vitamin w/FE, FA (PRENATAL 1 + 1) 27-1 MG TABS Take 1 tablet by mouth daily.          Review of Systems  Constitutional: Negative for fever.  Eyes: Negative for blurred vision.  Cardiovascular: Negative for chest pain and palpitations.  Gastrointestinal: Positive for  abdominal pain. Negative for nausea, vomiting, diarrhea and constipation.  Genitourinary: Negative for dysuria, urgency, frequency and hematuria.  Neurological: Negative for dizziness and headaches.  Psychiatric/Behavioral: Negative for depression and suicidal ideas.   Physical Exam   Blood pressure 144/93, pulse 70, temperature 98.3 F (36.8 C), temperature source Oral, resp. rate 16, weight 227 lb 6 oz (103.137 kg), last menstrual period 05/21/2011, SpO2 99.00%, not currently breastfeeding.  Physical Exam  Nursing note and vitals reviewed. Constitutional: She is oriented to person, place, and time. She appears well-developed and well-nourished. No distress.  HENT:  Head: Normocephalic and atraumatic.  Eyes: EOM are normal. Pupils are equal, round, and reactive to light.  GI: Soft. She exhibits no distension. There is no tenderness. There is no rebound and no guarding.  Genitourinary: There is bleeding around the vagina. Vaginal discharge found.       Minimal amount of vag bleeding noted on exam. Uterus NL size and shape with no obvious adnexal masses. Exam was technically difficult secondary to increased body habitus.  Neurological: She is alert and oriented to person, place, and time.  Skin: Skin is warm and dry. She is not diaphoretic.  Psychiatric: She has a normal  mood and affect. Her behavior is normal. Judgment and thought content normal.    MAU Course  Procedures  Wet prep and GC/Chlamydia cultures done.  Results for orders placed during the hospital encounter of 06/30/11 (from the past 24 hour(s))  URINALYSIS, ROUTINE W REFLEX MICROSCOPIC     Status: Abnormal   Collection Time   06/30/11  2:44 PM      Component Value Range   Color, Urine STRAW (*) YELLOW    APPearance CLEAR  CLEAR    Specific Gravity, Urine <1.005 (*) 1.005 - 1.030    pH 6.0  5.0 - 8.0    Glucose, UA NEGATIVE  NEGATIVE (mg/dL)   Hgb urine dipstick SMALL (*) NEGATIVE    Bilirubin Urine NEGATIVE   NEGATIVE    Ketones, ur NEGATIVE  NEGATIVE (mg/dL)   Protein, ur NEGATIVE  NEGATIVE (mg/dL)   Urobilinogen, UA 0.2  0.0 - 1.0 (mg/dL)   Nitrite NEGATIVE  NEGATIVE    Leukocytes, UA NEGATIVE  NEGATIVE   URINE MICROSCOPIC-ADD ON     Status: Normal   Collection Time   06/30/11  2:44 PM      Component Value Range   Squamous Epithelial / LPF RARE  RARE    RBC / HPF 0-2  <3 (RBC/hpf)  POCT PREGNANCY, URINE     Status: Normal   Collection Time   06/30/11  3:11 PM      Component Value Range   Preg Test, Ur NEGATIVE    WET PREP, GENITAL     Status: Abnormal   Collection Time   06/30/11  4:17 PM      Component Value Range   Yeast, Wet Prep NONE SEEN  NONE SEEN    Trich, Wet Prep NONE SEEN  NONE SEEN    Clue Cells, Wet Prep FEW (*) NONE SEEN    WBC, Wet Prep HPF POC NONE SEEN  NONE SEEN   CBC     Status: Normal   Collection Time   06/30/11  4:17 PM      Component Value Range   WBC 9.8  4.0 - 10.5 (K/uL)   RBC 4.92  3.87 - 5.11 (MIL/uL)   Hemoglobin 14.7  12.0 - 15.0 (g/dL)   HCT 40.9  81.1 - 91.4 (%)   MCV 84.1  78.0 - 100.0 (fL)   MCH 29.9  26.0 - 34.0 (pg)   MCHC 35.5  30.0 - 36.0 (g/dL)   RDW 78.2  95.6 - 21.3 (%)   Platelets 158  150 - 400 (K/uL)    US Transvaginal Non-ob  06/30/2011  *RADIOLOGY REPORT*  Clinical Data: Fetal bleeding with cramping following D&C on 05/21/2011.  TRANSVAGINAL ULTRASOUND OF PELVIS  Technique:  Transvaginal ultrasound examination of the pelvis was performed including evaluation of the uterus, ovaries, adnexal regions, and pelvic cul-de-sac.  Comparison:  01/15/2011  Findings:  Uterus:  The uterus demonstrates a sagittal length of 10.2 cm, AP depth of 4.3 cm and a transverse width of 4.5 cm.  A homogeneous myometrium is seen  Endometrium: Appears homogeneously echogenic with an AP width of 13 mm.  No areas of abnormal vascularity or heterogeneity are identified to suggest retained products of conception.  Right ovary: Measures 2.0 x 2.3 x 1.5 cm and has  a normal appearance  Left ovary: Measures 2.8 x 2.7 x 1.8 cm and has a normal appearance  Other Findings:  No pelvic fluid is seen.  IMPRESSION: No sonographic features suggestive of retained products  of conception.  Normal ovaries  Original Report Authenticated By: Bertha Stakes, M.D.    Assessment and Plan  Irregular vag bleeding: pt is likely having NL irregular vag bleeding following SAB and D&C. Discussed diet, activity, risks, and precautions. She will f/u with her PCP.  Clinton Gallant. Rice III, DrHSc, MPAS, PA-C  06/30/2011, 4:21 PM   Henrietta Hoover, PA 06/30/11 1711  Henrietta Hoover, [place] 06/30/11 1812

## 2011-07-01 LAB — GC/CHLAMYDIA PROBE AMP, GENITAL
Chlamydia, DNA Probe: NEGATIVE
GC Probe Amp, Genital: NEGATIVE

## 2011-08-09 ENCOUNTER — Encounter (HOSPITAL_COMMUNITY): Payer: Self-pay | Admitting: *Deleted

## 2011-08-09 ENCOUNTER — Inpatient Hospital Stay (HOSPITAL_COMMUNITY)
Admission: AD | Admit: 2011-08-09 | Discharge: 2011-08-09 | Discharge status: Against Medical Advice | Payer: Medicaid Other | Source: Ambulatory Visit | Attending: Obstetrics & Gynecology | Admitting: Obstetrics & Gynecology

## 2011-08-09 DIAGNOSIS — Z5320 Procedure and treatment not carried out because of patient's decision for unspecified reasons: Secondary | ICD-10-CM | POA: Insufficient documentation

## 2011-08-09 LAB — URINALYSIS, ROUTINE W REFLEX MICROSCOPIC
Glucose, UA: NEGATIVE mg/dL
Ketones, ur: NEGATIVE mg/dL
Protein, ur: NEGATIVE mg/dL
Urobilinogen, UA: 0.2 mg/dL (ref 0.0–1.0)

## 2011-08-09 LAB — POCT PREGNANCY, URINE: Preg Test, Ur: NEGATIVE

## 2011-08-09 NOTE — ED Notes (Signed)
Pt not in room all belongings gone. Notified CNM.

## 2011-08-09 NOTE — ED Notes (Signed)
Pt still not back in room. Elopement paper work filled out

## 2011-08-09 NOTE — Progress Notes (Signed)
Had D&C on 11/7  Bleed after normal then bleed again on 12/3 light pink spotting continued 1/10, started bleeding on 08/06/11 heavier, lower back pain

## 2011-09-26 ENCOUNTER — Inpatient Hospital Stay (HOSPITAL_COMMUNITY)
Admission: AD | Admit: 2011-09-26 | Discharge: 2011-09-27 | Disposition: A | Discharge status: Home / Self Care | Payer: Medicaid Other | Source: Ambulatory Visit | Attending: Obstetrics & Gynecology | Admitting: Obstetrics & Gynecology

## 2011-09-26 ENCOUNTER — Encounter (HOSPITAL_COMMUNITY): Payer: Self-pay

## 2011-09-26 ENCOUNTER — Inpatient Hospital Stay (HOSPITAL_COMMUNITY): Payer: Medicaid Other

## 2011-09-26 DIAGNOSIS — B958 Unspecified staphylococcus as the cause of diseases classified elsewhere: Secondary | ICD-10-CM | POA: Insufficient documentation

## 2011-09-26 DIAGNOSIS — N739 Female pelvic inflammatory disease, unspecified: Secondary | ICD-10-CM | POA: Insufficient documentation

## 2011-09-26 DIAGNOSIS — B9689 Other specified bacterial agents as the cause of diseases classified elsewhere: Secondary | ICD-10-CM

## 2011-09-26 DIAGNOSIS — R1031 Right lower quadrant pain: Secondary | ICD-10-CM | POA: Insufficient documentation

## 2011-09-26 LAB — GLUCOSE, CAPILLARY: Glucose-Capillary: 93 mg/dL (ref 70–99)

## 2011-09-26 LAB — URINALYSIS, ROUTINE W REFLEX MICROSCOPIC
Glucose, UA: NEGATIVE mg/dL
Hgb urine dipstick: NEGATIVE
Leukocytes, UA: NEGATIVE
Protein, ur: NEGATIVE mg/dL
pH: 6 (ref 5.0–8.0)

## 2011-09-26 LAB — POCT PREGNANCY, URINE: Preg Test, Ur: NEGATIVE

## 2011-09-26 LAB — CBC
HCT: 42.2 % (ref 36.0–46.0)
Hemoglobin: 14.5 g/dL (ref 12.0–15.0)
RBC: 4.99 MIL/uL (ref 3.87–5.11)
WBC: 12.4 10*3/uL — ABNORMAL HIGH (ref 4.0–10.5)

## 2011-09-26 MED ORDER — HYDROMORPHONE HCL PF 1 MG/ML IJ SOLN
1.0000 mg | INTRAMUSCULAR | Status: AC
  Administered 2011-09-26: 1 mg via INTRAMUSCULAR
  Filled 2011-09-26: qty 1

## 2011-09-26 NOTE — MAU Note (Signed)
Pt states, " I've had pain in my right lower abdomen, and I've had a sore, like a boil at my belly button one week ago and I stuck a sterile needle in it, and it has gotten redder below it. Now I have them under my my left armpit."

## 2011-09-26 NOTE — MAU Provider Note (Signed)
History     CSN: [id number]  Arrival date and time: 09/26/11 1910  [name] presents today to MAU for RLQ pain described as sharp and going on for several weeks and skin lesions on abdomen and under her left arm.  She used a needle to lance one of the lesions and now her abdomen is red and sore all over.  She reports having gained a large amount of weight in the last couple of years. She takes Percocet every day for back pain.  She denies n/v, fever/chills, h/a, vaginal itching/burning, urinary symptoms.      Chief Complaint  Patient presents with  . Abdominal Pain  . Sore   HPI  OB History    Grav Para Term Preterm Abortions TAB SAB Ect Mult Living   7 4 4  0 3 0 3 0 0 4      Past Medical History  Diagnosis Date  . Chronic back pain   . Kienbock's disease   . Morbid obesity   . Gestational diabetes 2008    on insulin  . Headache   . Asthma     exercise induced  . Trichomonas     Past Surgical History  Procedure Date  . Cesarean section   . Dilation and curettage of uterus   . Hernia repair   . Choley gall bladder  . Cholecystectomy   . Tooth extraction     17 teeth extracted  . Dilation and evacuation 05/21/2011    Procedure: DILATATION AND EVACUATION (D&E);  Surgeon: Leslie Arms, MD;  Location: [facility];  Service: Gynecology;  Laterality: N/A;  . Tubal ligation     Family History  Problem Relation Age of Onset  . Diabetes Mother   . Hypertension Brother   . Anesthesia problems Neg Hx     History  Substance Use Topics  . Smoking status: Current Everyday Smoker -- 1.0 packs/day for 17 years  . Smokeless tobacco: Never Used  . Alcohol Use: No    Allergies:  Allergies  Allergen Reactions  . Bee Venom Anaphylaxis  . Ultram (Tramadol Hcl) Anaphylaxis and Hives  . Magnesium-Containing Compounds Hives    Prescriptions prior to admission  Medication Sig Dispense Refill  . oxyCODONE-acetaminophen (PERCOCET) 10-325 MG per tablet Take 0.5 tablets by  mouth every 4 (four) hours as needed. For pain      . Prenatal Vit-Fe Fumarate-FA (PRENATAL MULTIVITAMIN) TABS Take 1 tablet by mouth daily.        Review of Systems  Constitutional: Negative for fever, chills and malaise/fatigue.  Eyes: Negative for blurred vision.  Respiratory: Negative for cough and shortness of breath.   Cardiovascular: Negative for chest pain.  Gastrointestinal: Positive for abdominal pain. Negative for heartburn, nausea and vomiting.  Genitourinary: Negative for dysuria, urgency and frequency.  Musculoskeletal: Negative.   Skin:       Boils and redness on abdomen and under left arm  Neurological: Negative for dizziness and headaches.  Psychiatric/Behavioral: Negative for depression.   Physical Exam   Blood pressure 136/93, pulse 89, temperature 99.3 F (37.4 C), temperature source Oral, resp. rate 20, height 5' 4.75" (1.645 m), weight 104.327 kg (230 lb).  Physical Exam  Nursing note and vitals reviewed. Constitutional: She is oriented to person, place, and time. She appears well-developed and well-nourished.  Neck: Normal range of motion.  Cardiovascular: Normal rate, regular rhythm and normal heart sounds.   Respiratory: Effort normal and breath sounds normal.  GI: Soft. Bowel sounds  are normal.  Genitourinary:       Pelvic exam: Cervix pink, nonfriable, scant clear discharge, vagina normal  Bimanual exam: Cervix 0/long/high, posterior, neg CMT, uterus nontender, nonpregnant size, left adnexa without tenderness, enlargement or mass, right adnexal area with tenderness, guarding, no mass or enlargement palpable  Musculoskeletal: Normal range of motion.  Neurological: She is alert and oriented to person, place, and time.  Skin: Skin is warm and dry.  Psychiatric: She has a normal mood and affect. Her behavior is normal. Judgment and thought content normal.   Results for orders placed during the hospital encounter of 09/26/11 (from the past 24 hour(s))    URINALYSIS, ROUTINE W REFLEX MICROSCOPIC     Status: Abnormal   Collection Time   09/26/11  8:09 PM      Component Value Range   Color, Urine YELLOW  YELLOW    APPearance CLEAR  CLEAR    Specific Gravity, Urine <1.005 (*) 1.005 - 1.030    pH 6.0  5.0 - 8.0    Glucose, UA NEGATIVE  NEGATIVE (mg/dL)   Hgb urine dipstick NEGATIVE  NEGATIVE    Bilirubin Urine NEGATIVE  NEGATIVE    Ketones, ur NEGATIVE  NEGATIVE (mg/dL)   Protein, ur NEGATIVE  NEGATIVE (mg/dL)   Urobilinogen, UA 0.2  0.0 - 1.0 (mg/dL)   Nitrite NEGATIVE  NEGATIVE    Leukocytes, UA NEGATIVE  NEGATIVE   POCT PREGNANCY, URINE     Status: Normal   Collection Time   09/26/11  8:23 PM      Component Value Range   Preg Test, Ur NEGATIVE  NEGATIVE   CBC     Status: Abnormal   Collection Time   09/26/11 10:15 PM      Component Value Range   WBC 12.4 (*) 4.0 - 10.5 (K/uL)   RBC 4.99  3.87 - 5.11 (MIL/uL)   Hemoglobin 14.5  12.0 - 15.0 (g/dL)   HCT 40.9  81.1 - 91.4 (%)   MCV 84.6  78.0 - 100.0 (fL)   MCH 29.1  26.0 - 34.0 (pg)   MCHC 34.4  30.0 - 36.0 (g/dL)   RDW 78.2  95.6 - 21.3 (%)   Platelets 201  150 - 400 (K/uL)  HEMOGLOBIN A1C     Status: Normal   Collection Time   09/26/11 10:15 PM      Component Value Range   Hemoglobin A1C 5.2  <5.7 (%)   Mean Plasma Glucose 103  <117 (mg/dL)  GLUCOSE, CAPILLARY     Status: Normal   Collection Time   09/26/11 10:22 PM      Component Value Range   Glucose-Capillary 93  70 - 99 (mg/dL)  WET PREP, GENITAL     Status: Abnormal   Collection Time   09/27/11 12:44 AM      Component Value Range   Yeast Wet Prep HPF POC NONE SEEN  NONE SEEN    Trich, Wet Prep NONE SEEN  NONE SEEN    Clue Cells Wet Prep HPF POC MODERATE (*) NONE SEEN    WBC, Wet Prep HPF POC RARE (*) NONE SEEN     MAU Course  Procedures  MDM Called Dr [name] to discuss lab results and normal U/S findings.  Plan for abdominal CT with contrast.  Treat for PID with Rocephin x1 dose in MAU and Doxycycline 100 mg  BID x14 days  if normal CT.   Assessment and Plan  A: Pelvic inflammatory disease Staph  infection of skin  P: D/C home Rocephin IM x1 dose in MAU Doxycycline 100 mg BID x14 days F/U with dermatologist and Gyn provider Return to MAU as needed  [name], [name] 09/26/2011, 10:34 PM

## 2011-09-27 ENCOUNTER — Inpatient Hospital Stay (HOSPITAL_COMMUNITY): Payer: Medicaid Other

## 2011-09-27 LAB — WET PREP, GENITAL
Trich, Wet Prep: NONE SEEN
Yeast Wet Prep HPF POC: NONE SEEN

## 2011-09-27 LAB — GC/CHLAMYDIA PROBE AMP, GENITAL
Chlamydia, DNA Probe: NEGATIVE
GC Probe Amp, Genital: NEGATIVE

## 2011-09-27 MED ORDER — CEFTRIAXONE SODIUM 250 MG IJ SOLR
250.0000 mg | INTRAMUSCULAR | Status: AC
Start: 2011-09-27 — End: 2011-09-27
  Administered 2011-09-27: 250 mg via INTRAMUSCULAR
  Filled 2011-09-27: qty 250

## 2011-09-27 MED ORDER — DOXYCYCLINE HYCLATE 100 MG PO TABS: 100.0000 mg | ORAL_TABLET | Freq: Two times a day (BID) | ORAL | Status: AC

## 2011-09-27 MED ORDER — LACTATED RINGERS IV SOLN
INTRAVENOUS | Status: DC
  Administered 2011-09-27: 02:00:00 via INTRAVENOUS

## 2011-09-27 MED ORDER — IOHEXOL 300 MG/ML  SOLN
100.0000 mL | Freq: Once | INTRAMUSCULAR | Status: AC | PRN
  Administered 2011-09-27: 100 mL via INTRAVENOUS

## 2011-09-27 MED ORDER — HYDROMORPHONE HCL PF 1 MG/ML IJ SOLN
1.0000 mg | INTRAMUSCULAR | Status: AC
  Administered 2011-09-27: 1 mg via INTRAVENOUS
  Filled 2011-09-27: qty 1

## 2011-09-27 NOTE — MAU Note (Signed)
To radiology for CT via wc.

## 2011-09-27 NOTE — Progress Notes (Signed)
Written and verbal d/c instructions given and understanding voiced. 

## 2011-09-27 NOTE — Discharge Instructions (Signed)
Pelvic Inflammatory Disease Pelvic Inflammatory Disease (PID) is an infection in some or all of your female organs. This includes the womb (uterus), ovaries, fallopian tubes and tissues in the pelvis. PID is a common cause of sudden onset (acute) lower abdominal (pelvic) pain. PID can be treated, but it is a serious infection. It may take weeks before you are completely well. In some cases, hospitalization is needed for surgery or to administer medications to kill germs (antibiotics) through your veins (intravenously). CAUSES   It may be caused by germs that are spread during sexual contact.   PID can also occur following:   The birth of a baby.   A miscarriage.   An abortion.   Major surgery of the pelvis.   Use of an IUD.   Sexual assault.  SYMPTOMS   Abdominal or pelvic pain.   Fever.   Chills.   Abnormal vaginal discharge.  DIAGNOSIS  Your caregiver will choose some of these methods to make a diagnosis:  A physical exam and history.   Blood tests.   Cultures of the vagina and cervix.   X-rays or ultrasound.   A procedure to look inside the pelvis (laparoscopy).  TREATMENT   Use of antibiotics by mouth or intravenously.   Treatment of sexual partners when the infection is an sexually transmitted disease (STD).   Hospitalization and surgery may be needed.  RISKS AND COMPLICATIONS   PID can cause women to become unable to have children (sterile) if left untreated or if partially treated. That is why it is important to finish all medications given to you.   Sterility or future tubal (ectopic) pregnancies can occur in fully treated individuals. This is why it is so important to follow your prescribed treatment.   It can cause longstanding (chronic) pelvic pain after frequent infections.   Painful intercourse.   Pelvic abscesses.   In rare cases, surgery or a hysterectomy may be needed.   If this is a sexually transmitted infection (STI), you are also at  risk for any other STD including AIDSor human papillomavirus (HPV).  HOME CARE INSTRUCTIONS   Finish all medication as prescribed. Incomplete treatment will put you at risk for sterility and tubal pregnancy.   Only take over-the-counter or prescription medicines for pain, discomfort, or fever as directed by your caregiver.   Do not have sex until treatment is completed or as directed by your caregiver. If PID is confirmed, your recent sexual contacts will need treatment.   Keep your follow-up appointments.  SEEK MEDICAL CARE IF:   You have increased or abnormal vaginal discharge.   You need prescription medication for your pain.   Your partner has an STD.   You are vomiting.   You cannot take your medications.  SEEK IMMEDIATE MEDICAL CARE IF:   You have a fever.   You develop increased abdominal or pelvic pain.   You develop chills.   You have pain when you urinate.   You are not better after 72 hours following treatment.  Document Released: 06/30/2005 Document Revised: 06/19/2011 Document Reviewed: 03/13/2007 [facility] Patient Information 2012 [place], [place].  Skin Infections A skin infection usually develops as a result of disruption of the skin barrier.  CAUSES  A skin infection might occur following:  Trauma or an injury to the skin such as a cut or insect sting.   Inflammation (as in eczema).   Breaks in the skin between the toes (as in athlete's foot).   Swelling (edema).  SYMPTOMS  The legs are the most common site affected. Usually there is:  Redness.   Swelling.   Pain.   There may be red streaks in the area of the infection.  TREATMENT   Minor skin infections may be treated with topical antibiotics, but if the skin infection is severe, hospital care and intravenous (IV) antibiotic treatment may be needed.   Most often skin infections can be treated with oral antibiotic medicine as well as proper rest and elevation of the affected area until the  infection improves.   If you are prescribed oral antibiotics, it is important to take them as directed and to take all the pills even if you feel better before you have finished all of the medicine.   You may apply warm compresses to the area for 20-30 minutes 4 times daily.  You might need a tetanus shot now if:  You have no idea when you had the last one.   You have never had a tetanus shot before.   Your wound had dirt in it.  If you need a tetanus shot and you decide not to get one, there is a rare chance of getting tetanus. Sickness from tetanus can be serious. If you get a tetanus shot, your arm may swell and become red and warm at the shot site. This is common and not a problem. SEEK MEDICAL CARE IF:  The pain and swelling from your infection do not improve within 2 days.  SEEK IMMEDIATE MEDICAL CARE IF:  You develop a fever, chills, or other serious problems.  Document Released: 08/07/2004 Document Revised: 06/19/2011 Document Reviewed: 06/19/2008 [facility] Patient Information 2012 [place], [place].

## 2011-09-27 NOTE — MAU Note (Signed)
Sharen Counter CNM in to discuss CT results and d/c plan.

## 2011-09-27 NOTE — MAU Note (Signed)
Pt crying. States had to push on lower abd for u/s which was painful.

## 2011-09-27 NOTE — MAU Note (Signed)
Rn in to tell pt about CT and need to have IV started. Pt dressed in bed stating "I just want to go home. I'm in so much pain I just want to leave. I don't care if my appendix busts". Sharen Counter CNM aware and in to talk with pt.

## 2011-09-27 NOTE — MAU Note (Signed)
Radiology in with contrast for CT and instructions given.

## 2011-10-30 ENCOUNTER — Encounter: Payer: Self-pay | Admitting: Obstetrics and Gynecology

## 2011-10-30 ENCOUNTER — Ambulatory Visit (INDEPENDENT_AMBULATORY_CARE_PROVIDER_SITE_OTHER): Payer: Medicaid Other | Admitting: Obstetrics and Gynecology

## 2011-10-30 ENCOUNTER — Other Ambulatory Visit (HOSPITAL_COMMUNITY)
Admission: RE | Admit: 2011-10-30 | Discharge: 2011-10-30 | Disposition: A | Discharge status: Home / Self Care | Payer: Medicaid Other | Source: Ambulatory Visit | Attending: Obstetrics and Gynecology | Admitting: Obstetrics and Gynecology

## 2011-10-30 VITALS — BP 147/94 | HR 88 | Temp 97.6°F | Ht 64.0 in | Wt 234.8 lb

## 2011-10-30 DIAGNOSIS — Z01419 Encounter for gynecological examination (general) (routine) without abnormal findings: Secondary | ICD-10-CM

## 2011-10-30 DIAGNOSIS — Z113 Encounter for screening for infections with a predominantly sexual mode of transmission: Secondary | ICD-10-CM | POA: Insufficient documentation

## 2011-10-30 DIAGNOSIS — I10 Essential (primary) hypertension: Secondary | ICD-10-CM | POA: Insufficient documentation

## 2011-10-30 DIAGNOSIS — F17200 Nicotine dependence, unspecified, uncomplicated: Secondary | ICD-10-CM | POA: Insufficient documentation

## 2011-10-30 DIAGNOSIS — N939 Abnormal uterine and vaginal bleeding, unspecified: Secondary | ICD-10-CM

## 2011-10-30 DIAGNOSIS — N898 Other specified noninflammatory disorders of vagina: Secondary | ICD-10-CM

## 2011-10-30 LAB — POCT URINALYSIS DIP (DEVICE)
Nitrite: NEGATIVE
Protein, ur: NEGATIVE mg/dL
Specific Gravity, Urine: 1.02 (ref 1.005–1.030)
Urobilinogen, UA: 0.2 mg/dL (ref 0.0–1.0)

## 2011-10-30 LAB — POCT PREGNANCY, URINE: Preg Test, Ur: NEGATIVE

## 2011-10-30 NOTE — Patient Instructions (Addendum)
Abnormal Uterine Bleeding Abnormal uterine bleeding can have many causes. Some cases are simply treated, while others are more serious. There are several kinds of bleeding that is considered abnormal, including:  Bleeding between periods.   Bleeding after sexual intercourse.   Spotting anytime in the menstrual cycle.   Bleeding heavier or more than normal.   Bleeding after menopause.  CAUSES  There are many causes of abnormal uterine bleeding. It can be present in teenagers, pregnant women, women during their reproductive years, and women who have reached menopause. Your caregiver will look for the more common causes depending on your age, signs, symptoms and your particular circumstance. Most cases are not serious and can be treated. Even the more serious causes, like cancer of the female organs, can be treated adequately if found in the early stages. That is why all types of bleeding should be evaluated and treated as soon as possible. DIAGNOSIS  Diagnosing the cause may take several kinds of tests. Your caregiver may:  Take a complete history of the type of bleeding.   Perform a complete physical exam and Pap smear.   Take an ultrasound on the abdomen showing a picture of the female organs and the pelvis.   Inject dye into the uterus and Fallopian tubes and X-ray them (hysterosalpingogram).   Place fluid in the uterus and do an ultrasound (sonohysterogrqphy).   Take a CT scan to examine the female organs and pelvis.   Take an MRI to examine the female organs and pelvis. There is no X-ray involved with this procedure.   Look inside the uterus with a telescope that has a light at the end (hysteroscopy).   Scrap the inside of the uterus to get tissue to examine (Dilatation and Curettage, D&C).   Look into the pelvis with a telescope that has a light at the end (laparoscopy). This is done through a very small cut (incision) in the abdomen.  TREATMENT  Treatment will depend on the  cause of the abnormal bleeding. It can include:  Doing nothing to allow the problem to take care of itself over time.   Hormone treatment.   Birth control pills.   Treating the medical condition causing the problem.   Laparoscopy.   Major or minor surgery   Destroying the lining of the uterus with electrical currant, laser, freezing or heat (uterine ablation).  HOME CARE INSTRUCTIONS   Follow your caregiver's recommendation on how to treat your problem.   See your caregiver if you missed a menstrual period and think you may be pregnant.   If you are bleeding heavily, count the number of pads/tampons you use and how often you have to change them. Tell this to your caregiver.   Avoid sexual intercourse until the problem is controlled.  SEEK MEDICAL CARE IF:   You have any kind of abnormal bleeding mentioned above.   You feel dizzy at times.   You are 26 years old and have not had a menstrual period yet.  SEEK IMMEDIATE MEDICAL CARE IF:   You pass out.   You are changing pads/tampons every 15 to 30 minutes.   You have belly (abdominal) pain.   You have a temperature of 100 F (37.8 C) or higher.   You become sweaty or weak.   You are passing large blood clots from the vagina.   You start to feel sick to your stomach (nauseous) and throw up (vomit).  Document Released: 06/30/2005 Document Revised: 06/19/2011 Document Reviewed: 11/23/2008 [employer]   Patient Information 2012 [employer]  Smoking Cessation This document explains the best ways for you to quit smoking and new treatments to help. It lists new medicines that can double or triple your chances of quitting and quitting for good. It also considers ways to avoid relapses and concerns you may have about quitting, including weight gain. NICOTINE: A POWERFUL ADDICTION If you have tried to quit smoking, you know how hard it can be. It is hard because nicotine is a very addictive drug. For some people, it can be as  addictive as heroin or cocaine. Usually, people make 2 or 3 tries, or more, before finally being able to quit. Each time you try to quit, you can learn about what helps and what hurts. Quitting takes hard work and a lot of effort, but you can quit smoking. QUITTING SMOKING IS ONE OF THE MOST IMPORTANT THINGS YOU WILL EVER DO.  You will live longer, feel better, and live better.   The impact on your body of quitting smoking is felt almost immediately:   Within 20 minutes, blood pressure decreases. Pulse returns to its normal level.   After 8 hours, carbon monoxide levels in the blood return to normal. Oxygen level increases.   After 24 hours, chance of heart attack starts to decrease. Breath, hair, and body stop smelling like smoke.   After 48 hours, damaged nerve endings begin to recover. Sense of taste and smell improve.   After 72 hours, the body is virtually free of nicotine. Bronchial tubes relax and breathing becomes easier.   After 2 to 12 weeks, lungs can hold more air. Exercise becomes easier and circulation improves.   Quitting will reduce your risk of having a heart attack, stroke, cancer, or lung disease:   After 1 year, the risk of coronary heart disease is cut in half.   After 5 years, the risk of stroke falls to the same as a nonsmoker.   After 10 years, the risk of lung cancer is cut in half and the risk of other cancers decreases significantly.   After 15 years, the risk of coronary heart disease drops, usually to the level of a nonsmoker.   If you are pregnant, quitting smoking will improve your chances of having a healthy baby.   The people you live with, especially your children, will be healthier.   You will have extra money to spend on things other than cigarettes.  FIVE KEYS TO QUITTING Studies have shown that these 5 steps will help you quit smoking and quit for good. You have the best chances of quitting if you use them together: 1. Get ready.  2. Get  support and encouragement.  3. Learn new skills and behaviors.  4. Get medicine to reduce your nicotine addiction and use it correctly.  5. Be prepared for relapse or difficult situations. Be determined to continue trying to quit, even if you do not succeed at first.  1. GET READY  Set a quit date.   Change your environment.   Get rid of ALL cigarettes, ashtrays, matches, and lighters in your home, car, and place of work.   Do not let people smoke in your home.   Review your past attempts to quit. Think about what worked and what did not.   Once you quit, do not smoke. NOT EVEN A PUFF!  2. GET SUPPORT AND ENCOURAGEMENT Studies have shown that you have a better chance of being successful if you have help. You can   get support in many ways.  Tell your family, friends, and coworkers that you are going to quit and need their support. Ask them not to smoke around you.   Talk to your caregivers (doctor, dentist, nurse, pharmacist, psychologist, and/or smoking counselor).   Get individual, group, or telephone counseling and support. The more counseling you have, the better your chances are of quitting. Programs are available at local hospitals and health centers. Call your local health department for information about programs in your area.   Spiritual beliefs and practices may help some smokers quit.   Quit meters are small computer programs online or downloadable that keep track of quit statistics, such as amount of "quit-time," cigarettes not smoked, and money saved.   Many smokers find one or more of the many self-help books available useful in helping them quit and stay off tobacco.  3. LEARN NEW SKILLS AND BEHAVIORS  Try to distract yourself from urges to smoke. Talk to someone, go for a walk, or occupy your time with a task.   When you first try to quit, change your routine. Take a different route to work. Drink tea instead of coffee. Eat breakfast in a different place.   Do  something to reduce your stress. Take a hot bath, exercise, or read a book.   Plan something enjoyable to do every day. Reward yourself for not smoking.   Explore interactive web-based programs that specialize in helping you quit.  4. GET MEDICINE AND USE IT CORRECTLY Medicines can help you stop smoking and decrease the urge to smoke. Combining medicine with the above behavioral methods and support can quadruple your chances of successfully quitting smoking. The [place] Food and Drug Administration (FDA) has approved 7 medicines to help you quit smoking. These medicines fall into 3 categories.  Nicotine replacement therapy (delivers nicotine to your body without the negative effects and risks of smoking):   Nicotine gum: Available over-the-counter.   Nicotine lozenges: Available over-the-counter.   Nicotine inhaler: Available by prescription.   Nicotine nasal spray: Available by prescription.   Nicotine skin patches (transdermal): Available by prescription and over-the-counter.   Antidepressant medicine (helps people abstain from smoking, but how this works is unknown):   Bupropion sustained-release (SR) tablets: Available by prescription.   Nicotinic receptor partial agonist (simulates the effect of nicotine in your brain):   Varenicline tartrate tablets: Available by prescription.   Ask your caregiver for advice about which medicines to use and how to use them. Carefully read the information on the package.   Everyone who is trying to quit may benefit from using a medicine. If you are pregnant or trying to become pregnant, nursing an infant, you are under age 18, or you smoke fewer than 10 cigarettes per day, talk to your caregiver before taking any nicotine replacement medicines.   You should stop using a nicotine replacement product and call your caregiver if you experience nausea, dizziness, weakness, vomiting, fast or irregular heartbeat, mouth problems with the lozenge or gum, or  redness or swelling of the skin around the patch that does not go away.   Do not use any other product containing nicotine while using a nicotine replacement product.   Talk to your caregiver before using these products if you have diabetes, heart disease, asthma, stomach ulcers, you had a recent heart attack, you have high blood pressure that is not controlled with medicine, a history of irregular heartbeat, or you have been prescribed medicine to help you quit smoking.    BE PREPARED FOR RELAPSE OR DIFFICULT SITUATIONS  Most relapses occur within the first 3 months after quitting. Do not be discouraged if you start smoking again. Remember, most people try several times before they finally quit.   You may have symptoms of withdrawal because your body is used to nicotine. You may crave cigarettes, be irritable, feel very hungry, cough often, get headaches, or have difficulty concentrating.   The withdrawal symptoms are only temporary. They are strongest when you first quit, but they will go away within 10 to 14 days.  Here are some difficult situations to watch for:  Alcohol. Avoid drinking alcohol. Drinking lowers your chances of successfully quitting.   Caffeine. Try to reduce the amount of caffeine you consume. It also lowers your chances of successfully quitting.   Other smokers. Being around smoking can make you want to smoke. Avoid smokers.   Weight gain. Many smokers will gain weight when they quit, usually less than 10 pounds. Eat a healthy diet and stay active. Do not let weight gain distract you from your main goal, quitting smoking. Some medicines that help you quit smoking may also help delay weight gain. You can always lose the weight gained after you quit.   Bad mood or depression. There are a lot of ways to improve your mood other than smoking.  If you are having problems with any of these situations, talk to your caregiver. SPECIAL SITUATIONS AND CONDITIONS Studies suggest  that everyone can quit smoking. Your situation or condition can give you a special reason to quit.  Pregnant women/new mothers: By quitting, you protect your baby's health and your own.   Hospitalized patients: By quitting, you reduce health problems and help healing.   Heart attack patients: By quitting, you reduce your risk of a second heart attack.   Lung, head, and neck cancer patients: By quitting, you reduce your chance of a second cancer.   Parents of children and adolescents: By quitting, you protect your children from illnesses caused by secondhand smoke.  QUESTIONS TO THINK ABOUT Think about the following questions before you try to stop smoking. You may want to talk about your answers with your caregiver.  Why do you want to quit?   If you tried to quit in the past, what helped and what did not?   What will be the most difficult situations for you after you quit? How will you plan to handle them?   Who can help you through the tough times? Your family? Friends? Caregiver?   What pleasures do you get from smoking? What ways can you still get pleasure if you quit?  Here are some questions to ask your caregiver:  How can you help me to be successful at quitting?   What medicine do you think would be best for me and how should I take it?   What should I do if I need more help?   What is smoking withdrawal like? How can I get information on withdrawal?  Quitting takes hard work and a lot of effort, but you can quit smoking. FOR MORE INFORMATION  Smokefree.gov (http://www.davis-sullivan.com/) provides free, accurate, evidence-based information and professional assistance to help support the immediate and long-term needs of people trying to quit smoking. Document Released: 06/24/2001 Document Revised: 06/19/2011 Document Reviewed: 04/16/2009 [facility] Patient Information 2012 [place], [place].

## 2011-10-30 NOTE — Progress Notes (Addendum)
Leslie Lade OteroHernandez25 [id number].Z.[id number]  Chief Complaint  Patient presents with  . bleeding since D & C     SUBJECTIVE  HPI: She presents stating that she has been bleeding almost continuouslysince her D&C in November done for retained products of conception after SAB. She initially stopped bleeding for about 2 weeks following the D&C bleed ever since then has had daily spotting at least when she wipes almost every day and does have moderate menstrual-like flow about once a month. No orthostatic symptoms.She denies irritative or abnormal vaginal discharge. She does have urinary frequency but no dysuria. She denies dysmenorrhea. Her PMD is the [facility] family [facility]. She's had frequent visits to ED's and has had hemoglobins usually about 14 last one noted was 09/26/2011. She denies new sexual partners and states she is attempting to conceive. She is afraid she cannot get pregnant if she is bleeding. Had BTL prior to last 2 pregnancies.    Past Medical History  Diagnosis Date  . Chronic back pain   . Kienbock's disease   . Morbid obesity   . Headache   . Asthma     exercise induced  . Trichomonas    Past Surgical History  Procedure Date  . Cesarean section   . Dilation and curettage of uterus   . Hernia repair   . Choley gall bladder  . Cholecystectomy   . Tooth extraction     17 teeth extracted  . Dilation and evacuation 05/21/2011    Procedure: DILATATION AND EVACUATION (D&E);  Surgeon: Lazaro Arms, MD;  Location: [facility];  Service: Gynecology;  Laterality: N/A;  . Tubal ligation    History   Social History  . Marital Status: Legally Separated    Spouse Name: N/A    Number of Children: N/A  . Years of Education: N/A   Occupational History  . Not on file.   Social History Main Topics  . Smoking status: Current Everyday Smoker -- 1.0 packs/day for 17 years  . Smokeless tobacco: Never Used  . Alcohol Use: No  . Drug Use: No  . Sexually Active: Yes    Birth Control/  Protection: None     tubal in 2008   Other Topics Concern  . Not on file   Social History Narrative  . No narrative on file   Current Outpatient Prescriptions on File Prior to Visit  Medication Sig Dispense Refill  . oxyCODONE-acetaminophen (PERCOCET) 10-325 MG per tablet Take 0.5 tablets by mouth every 4 (four) hours as needed. For pain      . Prenatal Vit-Fe Fumarate-FA (PRENATAL MULTIVITAMIN) TABS Take 1 tablet by mouth daily.       Allergies  Allergen Reactions  . Bee Venom Anaphylaxis  . Ultram (Tramadol Hcl) Anaphylaxis and Hives  . Magnesium-Containing Compounds Hives    ROS: Pertinent items in HPI  OBJECTIVE Blood pressure 147/94, pulse 88, temperature 97.6 F (36.4 C), temperature source Oral, height 5\' 4"  (1.626 m), weight 234 lb 12.8 oz (106.505 kg), last menstrual period 05/21/2011. GENERAL: Well-developed, obese female in no acute distress.  ABDOMEN: Soft, nontender, large diastasis EXTREMITIES: Nontender, no edema SPECULUM EXAM: NEFG, physiologic discharge, scant blood noted, cervix clean BIMANUAL: cervix parous, no CMT; uterus NSSP; no adnexal tenderness or masses   LAB RESULTS Upt neg   ASSESSMENT Abnormal uterine bleeding: metrorrhagia Obesity Hypertension Smoker  PLAN Pap sent GC/CT, [facility] sent Pelvic US C/W Dr. Debroah Loop - will consider Provera after [place] - pt declines now PNV 1/day,  increase fluids Smoking cessation   Return in about 2 weeks (around 11/13/2011). F/U with PMD re chronic health problems    Donnovan Stamour 10/30/2011 3:34 PM

## 2011-10-31 LAB — URINALYSIS, ROUTINE W REFLEX MICROSCOPIC
Ketones, ur: NEGATIVE mg/dL
Leukocytes, UA: NEGATIVE
Nitrite: NEGATIVE
Specific Gravity, Urine: 1.015 (ref 1.005–1.030)
Urobilinogen, UA: 0.2 mg/dL (ref 0.0–1.0)
pH: 6 (ref 5.0–8.0)

## 2011-10-31 LAB — WET PREP, GENITAL: WBC, Wet Prep HPF POC: NONE SEEN

## 2011-10-31 LAB — URINALYSIS, MICROSCOPIC ONLY

## 2011-11-03 ENCOUNTER — Ambulatory Visit (HOSPITAL_COMMUNITY)
Admission: RE | Admit: 2011-11-03 | Discharge: 2011-11-03 | Disposition: A | Discharge status: Home / Self Care | Payer: Medicaid Other | Source: Ambulatory Visit | Attending: Obstetrics and Gynecology | Admitting: Obstetrics and Gynecology

## 2011-11-03 DIAGNOSIS — I10 Essential (primary) hypertension: Secondary | ICD-10-CM | POA: Insufficient documentation

## 2011-11-03 DIAGNOSIS — Z01419 Encounter for gynecological examination (general) (routine) without abnormal findings: Secondary | ICD-10-CM

## 2011-11-03 DIAGNOSIS — N949 Unspecified condition associated with female genital organs and menstrual cycle: Secondary | ICD-10-CM | POA: Insufficient documentation

## 2011-11-03 DIAGNOSIS — N938 Other specified abnormal uterine and vaginal bleeding: Secondary | ICD-10-CM | POA: Insufficient documentation

## 2011-11-03 DIAGNOSIS — F17200 Nicotine dependence, unspecified, uncomplicated: Secondary | ICD-10-CM | POA: Insufficient documentation

## 2011-11-03 DIAGNOSIS — N939 Abnormal uterine and vaginal bleeding, unspecified: Secondary | ICD-10-CM

## 2011-11-19 ENCOUNTER — Ambulatory Visit (INDEPENDENT_AMBULATORY_CARE_PROVIDER_SITE_OTHER): Payer: Medicaid Other | Admitting: Advanced Practice Midwife

## 2011-11-19 ENCOUNTER — Encounter: Payer: Self-pay | Admitting: Advanced Practice Midwife

## 2011-11-19 VITALS — BP 128/94 | HR 90 | Temp 97.0°F | Ht 60.0 in | Wt 233.9 lb

## 2011-11-19 DIAGNOSIS — N926 Irregular menstruation, unspecified: Secondary | ICD-10-CM

## 2011-11-19 DIAGNOSIS — N938 Other specified abnormal uterine and vaginal bleeding: Secondary | ICD-10-CM

## 2011-11-19 NOTE — Progress Notes (Signed)
Pt states period started post D&C 05/2011 until 11/05/11 cont. Spotting and heavy bleeding.  Since 11/05/11 has not bleed since.  Pt shared concern about becoming pregnant but had BTL 2008

## 2011-11-19 NOTE — Patient Instructions (Signed)
Follow up as needed - your results were all normal at your last visit.

## 2011-11-21 NOTE — Progress Notes (Signed)
  Subjective:    Patient ID: [name], female    DOB: 01-05-1986, 26 y.o.   MRN: [id number]  HPI 26 y.o. [id number] here for review of results from last visit. Saw D. Poe, CNM 2 weeks ago for annual exam, had pap, u/s, wet prep and GC at that time . States that bleeding started s/p D&C for missed AB 05/2011, since that time had bleeding or spotting daily. Now states bleeding stopped 2 weeks ago, no bleeding or pain at this time. Pt also states that she regrets having a BTL in 2008. She thinks she may want to have another child. She states, "I regret doing it, but I know I'll never have the money to have it reversed." Has become pregnant twice since BTL.     Review of Systems  Constitutional: Negative for fever, chills and fatigue.  Respiratory: Negative.   Cardiovascular: Negative.   Gastrointestinal: Negative for nausea, vomiting, abdominal pain, diarrhea and constipation.  Genitourinary: Negative for dysuria, urgency, frequency, hematuria, flank pain, vaginal bleeding, vaginal discharge, difficulty urinating, genital sores, vaginal pain, menstrual problem, pelvic pain and dyspareunia.  Musculoskeletal: Negative.   Neurological: Negative.   Psychiatric/Behavioral: Negative.        Objective:   Physical Exam  Nursing note and vitals reviewed. Constitutional: She is oriented to person, place, and time. She appears well-developed and well-nourished. No distress.  Cardiovascular: Normal rate.   Pulmonary/Chest: Effort normal.  Musculoskeletal: Normal range of motion.  Neurological: She is alert and oriented to person, place, and time.  Skin: Skin is warm and dry.  Psychiatric: She has a normal mood and affect.          Assessment & Plan:  26 y.o. [id number]  Rev'd normal pap and u/s results, negative GC, few clue cells on wet prep, but no symptoms so will not treat for BV at this time Discussed regret over BTL and that it is a possibility that she could become pregnant  again, considering that she has conceived twice since her tubal, rev'd precautions F/U PRN

## 2012-07-03 ENCOUNTER — Encounter (HOSPITAL_COMMUNITY): Payer: Self-pay

## 2012-07-03 ENCOUNTER — Inpatient Hospital Stay (HOSPITAL_COMMUNITY)
Admission: AD | Admit: 2012-07-03 | Discharge: 2012-07-03 | Disposition: A | Discharge status: Home / Self Care | Payer: Medicaid Other | Source: Ambulatory Visit | Attending: Obstetrics & Gynecology | Admitting: Obstetrics & Gynecology

## 2012-07-03 DIAGNOSIS — R109 Unspecified abdominal pain: Secondary | ICD-10-CM | POA: Insufficient documentation

## 2012-07-03 DIAGNOSIS — N949 Unspecified condition associated with female genital organs and menstrual cycle: Secondary | ICD-10-CM | POA: Insufficient documentation

## 2012-07-03 DIAGNOSIS — N926 Irregular menstruation, unspecified: Secondary | ICD-10-CM | POA: Insufficient documentation

## 2012-07-03 DIAGNOSIS — R102 Pelvic and perineal pain: Secondary | ICD-10-CM

## 2012-07-03 LAB — COMPREHENSIVE METABOLIC PANEL
AST: 30 U/L (ref 0–37)
Albumin: 3.5 g/dL (ref 3.5–5.2)
CO2: 27 mEq/L (ref 19–32)
Calcium: 9.6 mg/dL (ref 8.4–10.5)
Creatinine, Ser: 0.65 mg/dL (ref 0.50–1.10)
GFR calc non Af Amer: >90 mL/min (ref >90)

## 2012-07-03 LAB — URINALYSIS, ROUTINE W REFLEX MICROSCOPIC
Glucose, UA: NEGATIVE mg/dL
Leukocytes, UA: NEGATIVE
Protein, ur: NEGATIVE mg/dL
Specific Gravity, Urine: <1.005 — ABNORMAL LOW (ref 1.005–1.030)
Urobilinogen, UA: 0.2 mg/dL (ref 0.0–1.0)

## 2012-07-03 LAB — CBC
MCH: 29.3 pg (ref 26.0–34.0)
MCV: 83.3 fL (ref 78.0–100.0)
Platelets: 198 10*3/uL (ref 150–400)
RDW: 13.1 % (ref 11.5–15.5)

## 2012-07-03 NOTE — MAU Note (Signed)
Pt reports she had a BTL in 2008, last year had a miscarriage and had a D&C. Has irregular periods x last year, for last week had had dizziness, nausea and off/on lower abd pain. LMP ? Marland Kitchen

## 2012-07-03 NOTE — MAU Provider Note (Signed)
History     CSN: [id number]  Arrival date and time: 07/03/12 2129   First Provider Initiated Contact with Patient 07/03/12 2214      Chief Complaint  Patient presents with  . Abdominal Pain  . Nausea   HPI  Pt is a [id number] here with report of irregular periods x one year is also experiencing occasional dizziness, nausea and off/on lower abd pain since D&C in November 2011.  Cycles have been irregular since D&C.  06/03/12 LMP.  Pregnancy test negative.  Pt declines STI testing, recently checked for infection.  No report of abnormal vaginal discharge or odor.  Pt states pain is similar to when she was pregnant in the past and wanted to make sure.     Past Medical History  Diagnosis Date  . Chronic back pain   . Kienbock's disease   . Morbid obesity   . Headache   . Asthma     exercise induced  . Trichomonas     Past Surgical History  Procedure Date  . Cesarean section   . Dilation and curettage of uterus   . Hernia repair   . Choley gall bladder  . Cholecystectomy   . Tooth extraction     17 teeth extracted  . Dilation and evacuation 05/21/2011    Procedure: DILATATION AND EVACUATION (D&E);  Surgeon: Lazaro Arms, MD;  Location: [facility];  Service: Gynecology;  Laterality: N/A;  . Tubal ligation     Family History  Problem Relation Age of Onset  . Diabetes Mother   . Hypertension Brother   . Anesthesia problems Neg Hx     History  Substance Use Topics  . Smoking status: Current Every Day Smoker -- 1.0 packs/day for 17 years  . Smokeless tobacco: Never Used  . Alcohol Use: No    Allergies:  Allergies  Allergen Reactions  . Bee Venom Anaphylaxis  . Ultram (Tramadol Hcl) Anaphylaxis and Hives  . Compazine (Prochlorperazine Edisylate) Other (See Comments)    Made patient very depressed  . Magnesium-Containing Compounds Hives    Prescriptions prior to admission  Medication Sig Dispense Refill  . oxyCODONE-acetaminophen (PERCOCET) 10-325 MG per tablet  Take 0.5 tablets by mouth every 4 (four) hours as needed. For pain        Review of Systems  Gastrointestinal: Positive for nausea and abdominal pain (intermittent).  Neurological: Positive for dizziness.   Physical Exam   Blood pressure 124/89, pulse 101, temperature 98.3 F (36.8 C), temperature source Oral, resp. rate 20, height 5\' 6"  (1.676 m), weight 111.131 kg (245 lb), SpO2 99.00%.  Physical Exam  Constitutional: She is oriented to person, place, and time. She appears well-developed and well-nourished. No distress.  HENT:  Head: Normocephalic.  Eyes: Pupils are equal, round, and reactive to light.  Neck: Normal range of motion. Neck supple.  Cardiovascular: Normal rate, regular rhythm and normal heart sounds.   Respiratory: Effort normal and breath sounds normal.  GI: Soft. Bowel sounds are normal. She exhibits no mass. There is no tenderness. There is no rebound and no guarding.  Genitourinary: Uterus is not enlarged and not tender. Cervix exhibits no motion tenderness and no discharge. Right adnexum displays no mass. Left adnexum displays no mass. No bleeding around the vagina.       Negative cervical motion tenderness  Neurological: She is alert and oriented to person, place, and time. She has normal reflexes.  Skin: Skin is warm and dry.    MAU Course  Procedures  CBC, CMP, TSH, UA  Results for orders placed during the hospital encounter of 07/03/12 (from the past 24 hour(s))  URINALYSIS, ROUTINE W REFLEX MICROSCOPIC     Status: Abnormal   Collection Time   07/03/12  9:42 PM      Component Value Range   Color, Urine YELLOW  YELLOW   APPearance CLEAR  CLEAR   Specific Gravity, Urine <1.005 (*) 1.005 - 1.030   pH 5.5  5.0 - 8.0   Glucose, UA NEGATIVE  NEGATIVE mg/dL   Hgb urine dipstick NEGATIVE  NEGATIVE   Bilirubin Urine NEGATIVE  NEGATIVE   Ketones, ur NEGATIVE  NEGATIVE mg/dL   Protein, ur NEGATIVE  NEGATIVE mg/dL   Urobilinogen, UA 0.2  0.0 - 1.0 mg/dL    Nitrite NEGATIVE  NEGATIVE   Leukocytes, UA NEGATIVE  NEGATIVE  POCT PREGNANCY, URINE     Status: Normal   Collection Time   07/03/12 10:01 PM      Component Value Range   Preg Test, Ur NEGATIVE  NEGATIVE  CBC     Status: Abnormal   Collection Time   07/03/12 10:23 PM      Component Value Range   WBC 14.1 (*) 4.0 - 10.5 K/uL   RBC 5.15 (*) 3.87 - 5.11 MIL/uL   Hemoglobin 15.1 (*) 12.0 - 15.0 g/dL   HCT 16.1  09.6 - 04.5 %   MCV 83.3  78.0 - 100.0 fL   MCH 29.3  26.0 - 34.0 pg   MCHC 35.2  30.0 - 36.0 g/dL   RDW 40.9  81.1 - 91.4 %   Platelets 198  150 - 400 K/uL  COMPREHENSIVE METABOLIC PANEL     Status: Abnormal   Collection Time   07/03/12 10:23 PM      Component Value Range   Sodium 136  135 - 145 mEq/L   Potassium 4.2  3.5 - 5.1 mEq/L   Chloride 98  96 - 112 mEq/L   CO2 27  19 - 32 mEq/L   Glucose, Bld 119 (*) 70 - 99 mg/dL   BUN 9  6 - 23 mg/dL   Creatinine, Ser 7.82  0.50 - 1.10 mg/dL   Calcium 9.6  8.4 - 95.6 mg/dL   Total Protein 7.8  6.0 - 8.3 g/dL   Albumin 3.5  3.5 - 5.2 g/dL   AST 30  0 - 37 U/L   ALT 28  0 - 35 U/L   Alkaline Phosphatase 94  39 - 117 U/L   Total Bilirubin 0.3  0.3 - 1.2 mg/dL   GFR calc non Af Amer >90  >90 mL/min   GFR calc Af Amer >90  >90 mL/min      Assessment and Plan  Pelvic Pain Irregular Menses  Plan: DC to home TSH pending Outpatient pelvic Ultrasound F/U in GYN clinic  [facility] 07/03/2012, 10:16 PM

## 2012-07-12 ENCOUNTER — Encounter: Payer: Self-pay | Admitting: Obstetrics and Gynecology

## 2012-07-12 ENCOUNTER — Ambulatory Visit (INDEPENDENT_AMBULATORY_CARE_PROVIDER_SITE_OTHER): Payer: Medicaid Other | Admitting: Obstetrics and Gynecology

## 2012-07-12 VITALS — BP 128/91 | HR 97 | Temp 98.5°F | Ht 66.0 in | Wt 244.7 lb

## 2012-07-12 DIAGNOSIS — R102 Pelvic and perineal pain: Secondary | ICD-10-CM

## 2012-07-12 DIAGNOSIS — N926 Irregular menstruation, unspecified: Secondary | ICD-10-CM

## 2012-07-12 NOTE — Progress Notes (Signed)
CC: Follow-up     HPI [name] is a 26 y.o. [id number] is here for F/U from MAU visit of 07/03/12 for irregular bleeding. She reports that her menses were always regular and monthly with 7-8 day flow until she had a D&E procedure for missed AB November of 2012. Since then. bleeding has been irregular with frequent spotting, sometimes heavy flow and cramps experienced in lower abd and low back. She now thinks she may be pregnant and she's had no period since 06/03/12. Of note she had BTL in 2008 and would like to become pregnant again. UPT and TSH neg 07/03/12. Recent neg GC/CT and declines testing today. Had neg pelvic US 10/25/11.   Past Medical History  Diagnosis Date  . Chronic back pain   . Kienbock's disease   . Morbid obesity   . Headache   . Asthma     exercise induced  . Trichomonas     OB History    Grav Para Term Preterm Abortions TAB SAB Ect Mult Living   7 4 4  0 3 0 3 0 0 4     # Outc Date GA Lbr Len/2nd Wgt Sex Del Anes PTL Lv   1 SAB 2002           Comments: had D&C   2 TRM 2003     [facility]      Comments: breech   3 TRM 2004     [facility]      4 SAB 2005           5 TRM 2006     [facility]      6 TRM 2008     [facility]      Comments: had BTL, GDM   7 SAB 2012              Past Surgical History  Procedure Date  . Cesarean section   . Dilation and curettage of uterus   . Hernia repair   . Choley gall bladder  . Cholecystectomy   . Tooth extraction     17 teeth extracted  . Dilation and evacuation 05/21/2011    Procedure: DILATATION AND EVACUATION (D&E);  Surgeon: Lazaro Arms, MD;  Location: [facility];  Service: Gynecology;  Laterality: N/A;  . Tubal ligation     History   Social History  . Marital Status: Married    Spouse Name: N/A    Number of Children: N/A  . Years of Education: N/A   Occupational History  . Not on file.   Social History Main Topics  . Smoking status: Current Every Day Smoker -- 1.0 packs/day for 17 years  . Smokeless tobacco:  Never Used  . Alcohol Use: No  . Drug Use: No  . Sexually Active: Yes    Birth Control/ Protection: None     Comment: tubal in 2008   Other Topics Concern  . Not on file   Social History Narrative  . No narrative on file    Current Outpatient Prescriptions on File Prior to Visit  Medication Sig Dispense Refill  . oxyCODONE-acetaminophen (PERCOCET) 10-325 MG per tablet Take 0.5 tablets by mouth every 4 (four) hours as needed. For pain        Allergies  Allergen Reactions  . Bee Venom Anaphylaxis  . Ultram (Tramadol Hcl) Anaphylaxis and Hives  . Compazine (Prochlorperazine Edisylate) Other (See Comments)    Made patient very depressed  . Magnesium-Containing Compounds Hives    ROS  Pertinent items in HPI  PHYSICAL EXAM Filed Vitals:   07/12/12 1529  BP: 128/91  Pulse: 97  Temp: 98.5 F (36.9 C)   General: Well nourished, well developed obese female in no acute distress Cardiovascular: Normal rate Respiratory: Normal effort Abdomen: Soft, nontender Back: No CVAT Extremities: No edema Neurologic: Alert and oriented Pelvic done 07/03/12 (not repeated)  LAB RESULTS UPT neg  I ASSESSMENT/PLAN   Irregular bleeding and abd/pelvic pain>C/W Dr. Marice Potter: Offerred Provera 10 mg qd day 1-10 of month to regulate bleeding. Pt. Declines as she wants to continue attempting to get pregnant. Since recent neg pelvic US, will not repeat now.  Obesity> Weight loss stressed  CHTN> Follow with PMD  See AVS for patient education. Follow up prn and 6 months for Routine GYN exam.      Danae Orleans, CNM 07/12/2012 3:58 PM

## 2012-07-12 NOTE — Patient Instructions (Signed)
Keep a menstrual calendar and bleeding calendar.Dysfunctional Uterine Bleeding Normally, menstrual periods begin between ages 28 to 57 in young women. A normal menstrual cycle/period may begin every 23 days up to 35 days and lasts from 1 to 7 days. Around 12 to 14 days before your menstrual period starts, ovulation (ovary produces an egg) occurs. When counting the time between menstrual periods, count from the first day of bleeding of the previous period to the first day of bleeding of the next period. Dysfunctional (abnormal) uterine bleeding is bleeding that is different from a normal menstrual period. Your periods may come earlier or later than usual. They may be lighter, have blood clots or be heavier. You may have bleeding between periods, or you may skip one period or more. You may have bleeding after sexual intercourse, bleeding after menopause, or no menstrual period. CAUSES   Pregnancy (normal, miscarriage, tubal).  IUDs (intrauterine device, birth control).  Birth control pills.  Hormone treatment.  Menopause.  Infection of the cervix.  Blood clotting problems.  Infection of the inside lining of the uterus.  Endometriosis, inside lining of the uterus growing in the pelvis and other female organs.  Adhesions (scar tissue) inside the uterus.  Obesity or severe weight loss.  Uterine polyps inside the uterus.  Cancer of the vagina, cervix, or uterus.  Ovarian cysts or polycystic ovary syndrome.  Medical problems (diabetes, thyroid disease).  Uterine fibroids (noncancerous tumor).  Problems with your female hormones.  Endometrial hyperplasia, very thick lining and enlarged cells inside of the uterus.  Medicines that interfere with ovulation.  Radiation to the pelvis or abdomen.  Chemotherapy. DIAGNOSIS   Your doctor will discuss the history of your menstrual periods, medicines you are taking, changes in your weight, stress in your life, and any medical problems  you may have.  Your doctor will do a physical and pelvic examination.  Your doctor may want to perform certain tests to make a diagnosis, such as:  Pap test.  Blood tests.  Cultures for infection.  CT scan.  Ultrasound.  Hysteroscopy.  Laparoscopy.  MRI.  Hysterosalpingography.  D and C.  Endometrial biopsy. TREATMENT  Treatment will depend on the cause of the dysfunctional uterine bleeding (DUB). Treatment may include:  Observing your menstrual periods for a couple of months.  Prescribing medicines for medical problems, including:  Antibiotics.  Hormones.  Birth control pills.  Removing an IUD (intrauterine device, birth control).  Surgery:  D and C (scrape and remove tissue from inside the uterus).  Laparoscopy (examine inside the abdomen with a lighted tube).  Uterine ablation (destroy lining of the uterus with electrical current, laser, heat, or freezing).  Hysteroscopy (examine cervix and uterus with a lighted tube).  Hysterectomy (remove the uterus). HOME CARE INSTRUCTIONS   If medicines were prescribed, take exactly as directed. Do not change or switch medicines without consulting your caregiver.  Long term heavy bleeding may result in iron deficiency. Your caregiver may have prescribed iron pills. They help replace the iron that your body lost from heavy bleeding. Take exactly as directed.  Do not take aspirin or medicines that contain aspirin one week before or during your menstrual period. Aspirin may make the bleeding worse.  If you need to change your sanitary pad or tampon more than once every 2 hours, stay in bed with your feet elevated and a cold pack on your lower abdomen. Rest as much as possible, until the bleeding stops or slows down.  Eat well-balanced meals.  Eat foods high in iron. Examples are:  Leafy green vegetables.  Whole-grain breads and cereals.  Eggs.  Meat.  Liver.  Do not try to lose weight until the abnormal  bleeding has stopped and your blood iron level is back to normal. Do not lift more than ten pounds or do strenuous activities when you are bleeding.  For a couple of months, make note on your calendar, marking the start and ending of your period, and the type of bleeding (light, medium, heavy, spotting, clots or missed periods). This is for your caregiver to better evaluate your problem. SEEK MEDICAL CARE IF:   You develop nausea (feeling sick to your stomach) and vomiting, dizziness, or diarrhea while you are taking your medicine.  You are getting lightheaded or weak.  You have any problems that may be related to the medicine you are taking.  You develop pain with your DUB.  You want to remove your IUD.  You want to stop or change your birth control pills or hormones.  You have any type of abnormal bleeding mentioned above.  You are over 28 years old and have not had a menstrual period yet.  You are 26 years old and you are still having menstrual periods.  You have any of the symptoms mentioned above.  You develop a rash. SEEK IMMEDIATE MEDICAL CARE IF:   An oral temperature above 102 F (38.9 C) develops.  You develop chills.  You are changing your sanitary pad or tampon more than once an hour.  You develop abdominal pain.  You pass out or faint. Document Released: 06/27/2000 Document Revised: 09/22/2011 Document Reviewed: 05/29/2009 [facility] Patient Information 2013 [place], [place].

## 2012-08-28 ENCOUNTER — Other Ambulatory Visit: Payer: Self-pay

## 2012-10-09 ENCOUNTER — Emergency Department (HOSPITAL_COMMUNITY): Payer: Medicaid Other

## 2012-10-09 ENCOUNTER — Emergency Department (HOSPITAL_COMMUNITY)
Admission: EM | Admit: 2012-10-09 | Discharge: 2012-10-10 | Disposition: A | Discharge status: Home / Self Care | Payer: Medicaid Other | Attending: Emergency Medicine | Admitting: Emergency Medicine

## 2012-10-09 DIAGNOSIS — J9801 Acute bronchospasm: Secondary | ICD-10-CM | POA: Insufficient documentation

## 2012-10-09 DIAGNOSIS — J029 Acute pharyngitis, unspecified: Secondary | ICD-10-CM | POA: Insufficient documentation

## 2012-10-09 DIAGNOSIS — J45909 Unspecified asthma, uncomplicated: Secondary | ICD-10-CM | POA: Insufficient documentation

## 2012-10-09 DIAGNOSIS — F17200 Nicotine dependence, unspecified, uncomplicated: Secondary | ICD-10-CM | POA: Insufficient documentation

## 2012-10-09 DIAGNOSIS — R6889 Other general symptoms and signs: Secondary | ICD-10-CM | POA: Insufficient documentation

## 2012-10-09 DIAGNOSIS — J069 Acute upper respiratory infection, unspecified: Secondary | ICD-10-CM | POA: Insufficient documentation

## 2012-10-09 DIAGNOSIS — R0982 Postnasal drip: Secondary | ICD-10-CM | POA: Insufficient documentation

## 2012-10-09 DIAGNOSIS — Z8739 Personal history of other diseases of the musculoskeletal system and connective tissue: Secondary | ICD-10-CM | POA: Insufficient documentation

## 2012-10-09 DIAGNOSIS — R509 Fever, unspecified: Secondary | ICD-10-CM | POA: Insufficient documentation

## 2012-10-09 DIAGNOSIS — Z3202 Encounter for pregnancy test, result negative: Secondary | ICD-10-CM | POA: Insufficient documentation

## 2012-10-09 DIAGNOSIS — G8929 Other chronic pain: Secondary | ICD-10-CM | POA: Insufficient documentation

## 2012-10-09 DIAGNOSIS — M549 Dorsalgia, unspecified: Secondary | ICD-10-CM | POA: Insufficient documentation

## 2012-10-09 DIAGNOSIS — Z79899 Other long term (current) drug therapy: Secondary | ICD-10-CM | POA: Insufficient documentation

## 2012-10-09 DIAGNOSIS — Z8611 Personal history of tuberculosis: Secondary | ICD-10-CM | POA: Insufficient documentation

## 2012-10-09 LAB — POCT PREGNANCY, URINE: Preg Test, Ur: NEGATIVE

## 2012-10-09 MED ORDER — ALBUTEROL SULFATE (5 MG/ML) 0.5% IN NEBU
5.0000 mg | INHALATION_SOLUTION | Freq: Once | RESPIRATORY_TRACT | Status: AC
  Administered 2012-10-09: 5 mg via RESPIRATORY_TRACT
  Filled 2012-10-09: qty 1

## 2012-10-09 NOTE — ED Notes (Signed)
Pt c/o SOB, sore throat, Neck pain, cough

## 2012-10-10 MED ORDER — PREDNISONE 20 MG PO TABS: 60.0000 mg | ORAL_TABLET | Freq: Every day | ORAL | Status: DC

## 2012-10-10 MED ORDER — ALBUTEROL SULFATE (5 MG/ML) 0.5% IN NEBU
5.0000 mg | INHALATION_SOLUTION | Freq: Once | RESPIRATORY_TRACT | Status: AC
Start: 2012-10-10 — End: 2012-10-10
  Administered 2012-10-10: 5 mg via RESPIRATORY_TRACT
  Filled 2012-10-10: qty 1

## 2012-10-10 MED ORDER — DM-GUAIFENESIN ER 30-600 MG PO TB12
1.0000 | ORAL_TABLET | ORAL | Status: AC
  Administered 2012-10-10: 1 via ORAL
  Filled 2012-10-10: qty 1

## 2012-10-10 MED ORDER — BENZONATATE 200 MG PO CAPS: 200.0000 mg | ORAL_CAPSULE | Freq: Three times a day (TID) | ORAL | Status: DC | PRN

## 2012-10-10 MED ORDER — ALBUTEROL SULFATE HFA 108 (90 BASE) MCG/ACT IN AERS: 1.0000 | INHALATION_SPRAY | RESPIRATORY_TRACT | Status: DC | PRN

## 2012-10-10 MED ORDER — BENZONATATE 100 MG PO CAPS: 200.0000 mg | ORAL_CAPSULE | Freq: Three times a day (TID) | ORAL | Status: DC | PRN

## 2012-10-10 MED ORDER — PREDNISONE 20 MG PO TABS
60.0000 mg | ORAL_TABLET | Freq: Once | ORAL | Status: AC
  Administered 2012-10-10: 60 mg via ORAL
  Filled 2012-10-10: qty 3

## 2012-10-10 MED ORDER — IPRATROPIUM BROMIDE 0.02 % IN SOLN
0.5000 mg | Freq: Once | RESPIRATORY_TRACT | Status: AC
  Administered 2012-10-10: 0.5 mg via RESPIRATORY_TRACT
  Filled 2012-10-10: qty 2.5

## 2012-10-10 MED ORDER — DM-GUAIFENESIN ER 30-600 MG PO TB12: 1.0000 | ORAL_TABLET | Freq: Two times a day (BID) | ORAL | Status: DC

## 2012-10-10 NOTE — ED Provider Notes (Signed)
History     CSN: [id number]  Arrival date & time 10/09/12  2138   First MD Initiated Contact with Patient 10/10/12 0006      Chief Complaint  Patient presents with  . Shortness of Breath    (Consider location/radiation/quality/duration/timing/severity/associated sxs/prior treatment) HPI 27 year old female presents to emergency room with complaint of sore throat, worsening over the last 3 months, cough for 2 weeks.  Patient initially thought sore throat, was due to allergies, had been taking Benadryl, and Claritin.  She stopped when that did not seem to make it any better.  Patient is been taking NyQuil and DayQuil for her cough and upper respiratory illness symptoms.  She reports runny nose, postnasal drip.  Patient has a history of exercise-induced asthma, no longer has an inhaler.  She has noted increasing wheezing over the last week.  She is a smoker.  Low-grade fevers at times.  Patient complains headache when she has been coughing for long periods and chest wall pain due to coughing.  Patient is pending a followup with her primary care Dr. for pain related issues, and was going to bring up these complaints at that time.  She reports tonight, symptoms worsened and wanted with relief. Past Medical History  Diagnosis Date  . Chronic back pain   . Kienbock's disease   . Morbid obesity   . Headache   . Asthma     exercise induced  . Trichomonas     Past Surgical History  Procedure Laterality Date  . Cesarean section    . Dilation and curettage of uterus    . Hernia repair    . Choley  gall bladder  . Cholecystectomy    . Tooth extraction      17 teeth extracted  . Dilation and evacuation  05/21/2011    Procedure: DILATATION AND EVACUATION (D&E);  Surgeon: Lazaro Arms, MD;  Location: [facility];  Service: Gynecology;  Laterality: N/A;  . Tubal ligation      Family History  Problem Relation Age of Onset  . Diabetes Mother   . Hypertension Brother   . Anesthesia problems Neg  Hx     History  Substance Use Topics  . Smoking status: Current Every Day Smoker -- 1.00 packs/day for 17 years  . Smokeless tobacco: Never Used  . Alcohol Use: No    OB History   Grav Para Term Preterm Abortions TAB SAB Ect Mult Living   7 4 4  0 3 0 3 0 0 4      Review of Systems  See History of Present Illness; otherwise all other systems are reviewed and negative Allergies  Bee venom; Ultram; Compazine; and Magnesium-containing compounds  Home Medications   Current Outpatient Rx  Name  Route  Sig  Dispense  Refill  . diphenhydrAMINE (BENADRYL) 25 MG tablet   Oral   Take 25 mg by mouth daily.         Marland Kitchen loratadine (CLARITIN) 10 MG tablet   Oral   Take 10 mg by mouth daily.         Marland Kitchen oxyCODONE-acetaminophen (PERCOCET) 10-325 MG per tablet   Oral   Take 1-2 tablets by mouth every 6 (six) hours as needed for pain. For pain           BP 128/65  Pulse 89  Temp(Src) 99.1 F (37.3 C) (Oral)  Resp 22  SpO2 96%  LMP 09/17/2012  Physical Exam  Nursing note and vitals reviewed. Constitutional: She  is oriented to person, place, and time. She appears well-developed and well-nourished. She appears distressed (uncomfortable appearing).  HENT:  Head: Normocephalic and atraumatic.  Right Ear: External ear normal.  Left Ear: External ear normal.  Nose: Nose normal.  Mouth/Throat: Oropharynx is clear and moist. No oropharyngeal exudate (mild erythema to posterior pharynx).  Rhinorrhea noted  Eyes: Conjunctivae and EOM are normal. Pupils are equal, round, and reactive to light.  Neck: Normal range of motion. Neck supple. No JVD present. No tracheal deviation present. No thyromegaly present.  Cardiovascular: Normal rate, regular rhythm, normal heart sounds and intact distal pulses.  Exam reveals no gallop and no friction rub.   No murmur heard. Pulmonary/Chest: Effort normal. No stridor. No respiratory distress. She has wheezes. She has no rales. She exhibits no  tenderness.  Abdominal: Soft. Bowel sounds are normal. She exhibits no distension and no mass. There is no tenderness. There is no rebound and no guarding.  Musculoskeletal: Normal range of motion. She exhibits no edema and no tenderness.  Lymphadenopathy:    She has no cervical adenopathy.  Neurological: She is alert and oriented to person, place, and time. She exhibits normal muscle tone. Coordination normal.  Skin: Skin is warm and dry. No rash noted. No erythema. No pallor.  Psychiatric: She has a normal mood and affect. Her behavior is normal. Judgment and thought content normal.    ED Course  Procedures (including critical care time)  Labs Reviewed  POCT PREGNANCY, URINE   Dg Chest 2 View  10/09/2012  *RADIOLOGY REPORT*  Clinical Data: Shortness of breath, chest and epigastric pain, history bronchitis, asthma  CHEST - 2 VIEW  Comparison: 12/02/2010  Findings: Normal heart size, mediastinal contours, and pulmonary vascularity. Lungs clear. No pleural effusion or pneumothorax. No acute osseous findings.  IMPRESSION: No acute abnormalities.   Original Report Authenticated By: Ulyses Southward, M.D.      1. Bronchospasm   2. Upper respiratory infection       MDM  27 year old female with several month history of sore throat, but more acutely.  Upper respiratory illness with cough.  Patient with with wheezing on exam.  Suspect asthma exacerbation from the upper respiratory illness.  We'll place her on 5 day prednisone burst.  Patient has received one albuterol neb treatment, and reports it did improve her breathing.  We'll also prescribe some symptomatic medications to include Tessalon and Mucinex DM.      1:54 AM Pt feeling much better.  Will d/c home to f/u with her pcm.  Olivia Mackie, MD 10/10/12 [phone]

## 2012-10-24 IMAGING — CR DG CHEST 2V
2 series · 2 of 2 positions shown · non-contrast
Comparison: 06/19/2009

CLINICAL DATA: Productive cough and high fever.

CHEST - 2 VIEW

[view not recorded (1 of 2)]
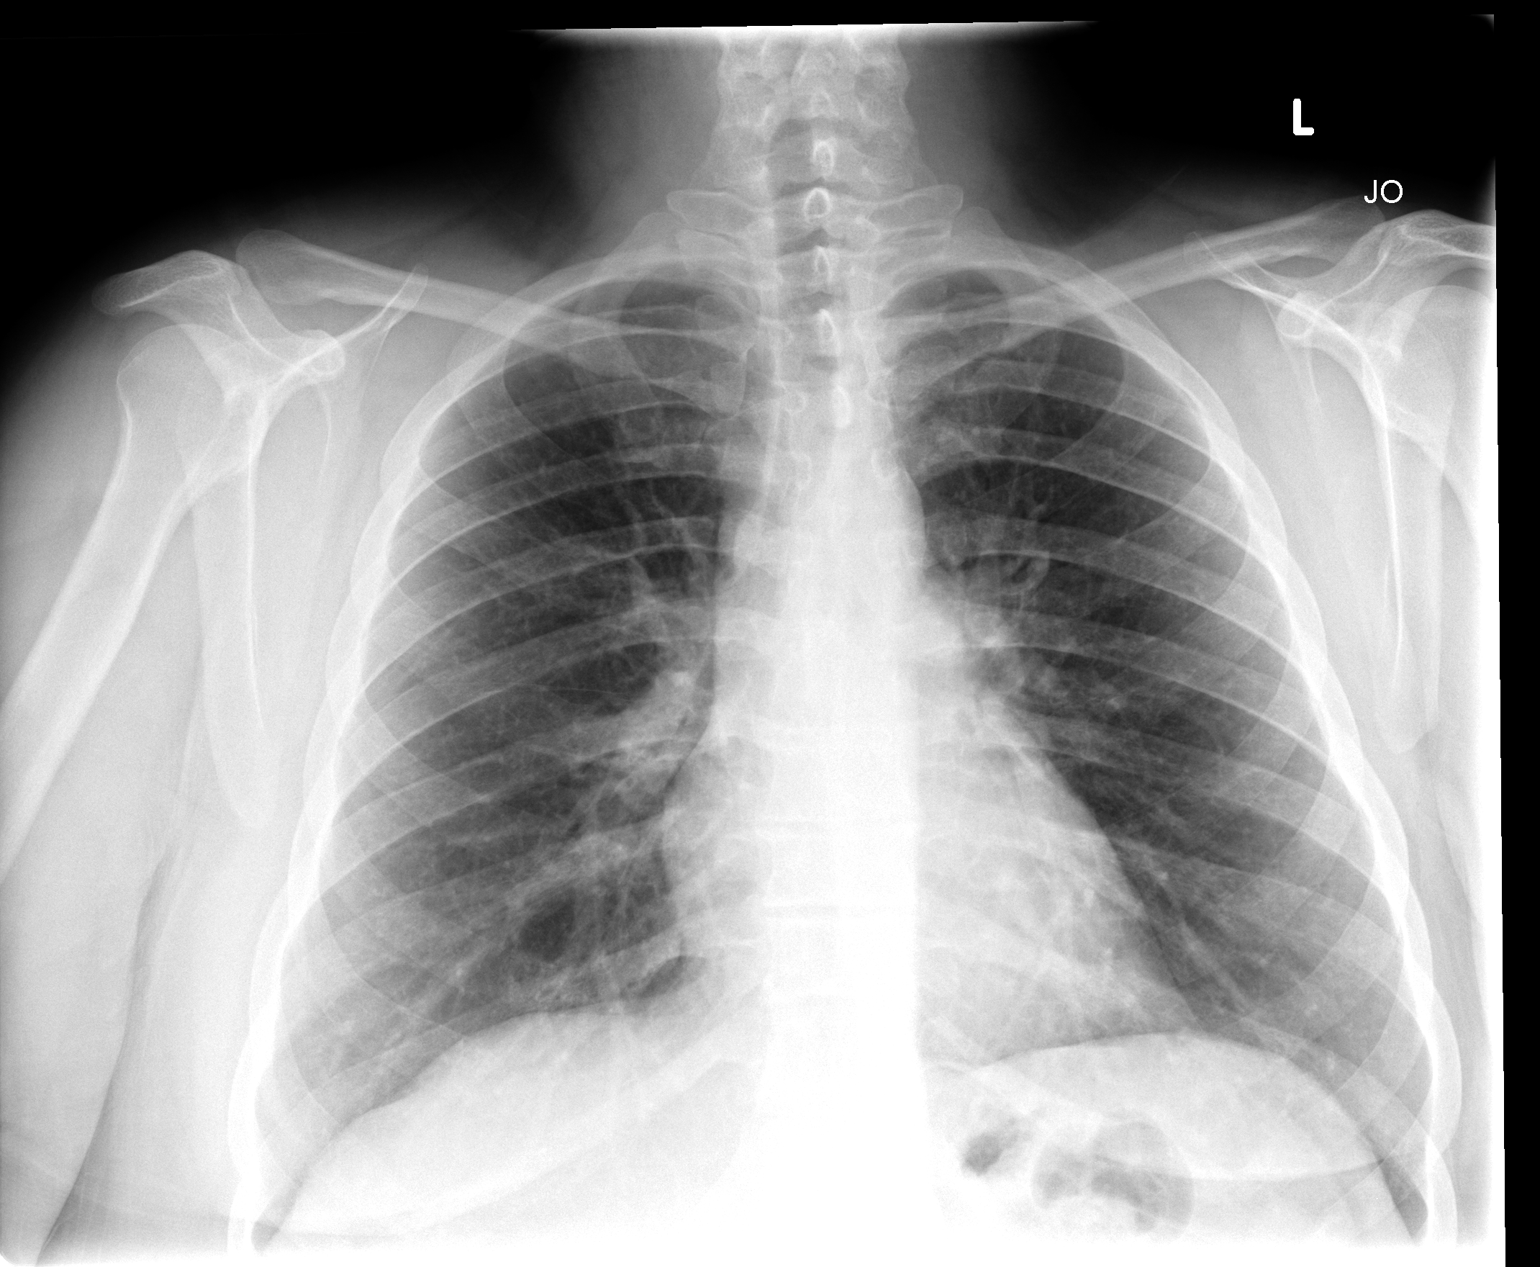

[view not recorded (2 of 2)]
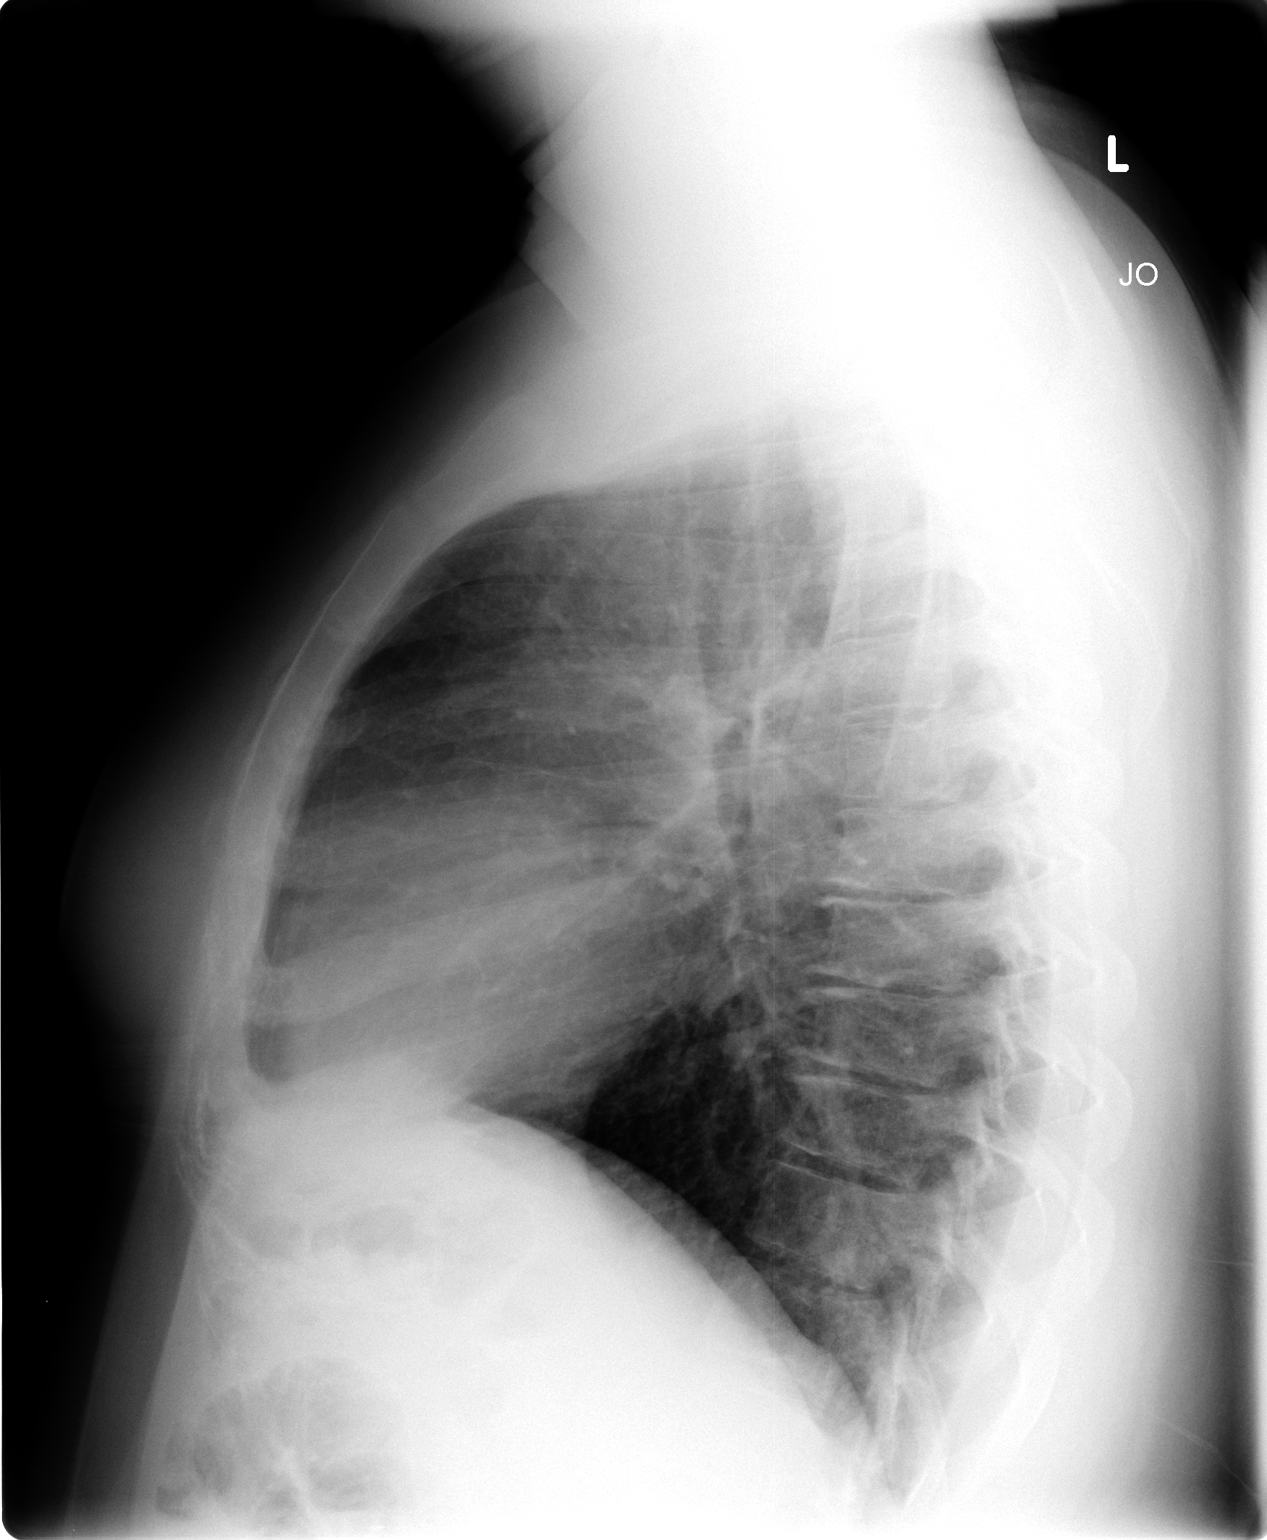

[2 of 2 positions shown; findings below may reference images not displayed]

FINDINGS: Trachea is midline.  Heart size normal.  Lungs are clear.
No pleural fluid.
IMPRESSION: No acute findings.

## 2013-05-19 ENCOUNTER — Other Ambulatory Visit: Payer: Self-pay

## 2013-08-17 ENCOUNTER — Emergency Department (HOSPITAL_COMMUNITY): Payer: Medicaid Other

## 2013-08-17 ENCOUNTER — Encounter (HOSPITAL_COMMUNITY): Payer: Self-pay | Admitting: Emergency Medicine

## 2013-08-17 ENCOUNTER — Emergency Department (HOSPITAL_COMMUNITY)
Admission: EM | Admit: 2013-08-17 | Discharge: 2013-08-17 | Disposition: A | Discharge status: Home / Self Care | Payer: Medicaid Other | Attending: Emergency Medicine | Admitting: Emergency Medicine

## 2013-08-17 DIAGNOSIS — F17200 Nicotine dependence, unspecified, uncomplicated: Secondary | ICD-10-CM | POA: Insufficient documentation

## 2013-08-17 DIAGNOSIS — J45901 Unspecified asthma with (acute) exacerbation: Secondary | ICD-10-CM | POA: Insufficient documentation

## 2013-08-17 DIAGNOSIS — Z8739 Personal history of other diseases of the musculoskeletal system and connective tissue: Secondary | ICD-10-CM | POA: Insufficient documentation

## 2013-08-17 DIAGNOSIS — J111 Influenza due to unidentified influenza virus with other respiratory manifestations: Secondary | ICD-10-CM | POA: Insufficient documentation

## 2013-08-17 DIAGNOSIS — G8929 Other chronic pain: Secondary | ICD-10-CM | POA: Insufficient documentation

## 2013-08-17 DIAGNOSIS — Z8619 Personal history of other infectious and parasitic diseases: Secondary | ICD-10-CM | POA: Insufficient documentation

## 2013-08-17 DIAGNOSIS — R69 Illness, unspecified: Secondary | ICD-10-CM

## 2013-08-17 DIAGNOSIS — Z79899 Other long term (current) drug therapy: Secondary | ICD-10-CM | POA: Insufficient documentation

## 2013-08-17 MED ORDER — ALBUTEROL SULFATE HFA 108 (90 BASE) MCG/ACT IN AERS
2.0000 | INHALATION_SPRAY | Freq: Once | RESPIRATORY_TRACT | Status: AC
  Administered 2013-08-17: 2 via RESPIRATORY_TRACT
  Filled 2013-08-17: qty 6.7

## 2013-08-17 MED ORDER — OSELTAMIVIR PHOSPHATE 75 MG PO CAPS: 75.0000 mg | ORAL_CAPSULE | Freq: Two times a day (BID) | ORAL | Status: DC

## 2013-08-17 NOTE — ED Notes (Signed)
Per pt, starting having cold symptoms yesterday-fever, body aches, cough

## 2013-08-17 NOTE — ED Provider Notes (Signed)
CSN: [id number]     Arrival date & time 08/17/13  0727 History   First MD Initiated Contact with Patient 08/17/13 [phone]     Chief Complaint  Patient presents with  . Fever  . URI   (Consider location/radiation/quality/duration/timing/severity/associated sxs/prior Treatment) Patient is a 28 y.o. female presenting with fever.  Fever Temp source:  Subjective Severity:  Severe Onset quality:  Gradual Duration:  28 hours Timing:  Constant Progression:  Unchanged Chronicity:  New Relieved by:  Ibuprofen Ineffective treatments: percocet. Associated symptoms: chills, congestion, cough and myalgias   Associated symptoms: no chest pain, no diarrhea, no nausea and no vomiting     Past Medical History  Diagnosis Date  . Chronic back pain   . Kienbock's disease   . Morbid obesity   . Headache(784.0)   . Asthma     exercise induced  . Trichomonas    Past Surgical History  Procedure Laterality Date  . Cesarean section    . Dilation and curettage of uterus    . Hernia repair    . Choley  gall bladder  . Cholecystectomy    . Tooth extraction      17 teeth extracted  . Dilation and evacuation  05/21/2011    Procedure: DILATATION AND EVACUATION (D&E);  Surgeon: Lazaro Arms, MD;  Location: [facility];  Service: Gynecology;  Laterality: N/A;  . Tubal ligation     Family History  Problem Relation Age of Onset  . Diabetes Mother   . Hypertension Brother   . Anesthesia problems Neg Hx    History  Substance Use Topics  . Smoking status: Current Every Day Smoker -- 1.00 packs/day for 17 years  . Smokeless tobacco: Never Used  . Alcohol Use: No   OB History   Grav Para Term Preterm Abortions TAB SAB Ect Mult Living   7 4 4  0 3 0 3 0 0 4     Review of Systems  Constitutional: Positive for fever and chills.  HENT: Positive for congestion.   Respiratory: Positive for cough. Negative for shortness of breath.   Cardiovascular: Negative for chest pain.  Gastrointestinal: Negative for  nausea, vomiting, abdominal pain and diarrhea.  Musculoskeletal: Positive for myalgias.  All other systems reviewed and are negative.    Allergies  Bee venom; Ultram; Compazine; and Magnesium-containing compounds  Home Medications   Current Outpatient Rx  Name  Route  Sig  Dispense  Refill  . ibuprofen (ADVIL,MOTRIN) 200 MG tablet   Oral   Take 800 mg by mouth every 6 (six) hours as needed.         Marland Kitchen lisinopril (PRINIVIL,ZESTRIL) 20 MG tablet   Oral   Take 20 mg by mouth daily.         Marland Kitchen oxyCODONE-acetaminophen (PERCOCET) 10-325 MG per tablet   Oral   Take 1-2 tablets by mouth every 6 (six) hours as needed for pain. For pain         . Pseudoeph-Doxylamine-DM-APAP (NYQUIL PO)   Oral   Take 30 mLs by mouth at bedtime.         . Pseudoephedrine-APAP-DM (DAYQUIL PO)   Oral   Take 30 mLs by mouth daily.         Marland Kitchen albuterol (PROVENTIL HFA;VENTOLIN HFA) 108 (90 BASE) MCG/ACT inhaler   Inhalation   Inhale 1-2 puffs into the lungs every 4 (four) hours as needed for wheezing or shortness of breath.   1 Inhaler   0  BP 121/76  Pulse 91  Temp(Src) 98.4 F (36.9 C) (Oral)  Resp 18  SpO2 94%  LMP 08/02/2013 Physical Exam  Nursing note and vitals reviewed. Constitutional: She is oriented to person, place, and time. She appears well-developed and well-nourished. No distress.  HENT:  Head: Normocephalic and atraumatic.  Mouth/Throat: Oropharynx is clear and moist.  Eyes: Conjunctivae are normal. Pupils are equal, round, and reactive to light. No scleral icterus.  Neck: Neck supple.  Cardiovascular: Normal rate, regular rhythm, normal heart sounds and intact distal pulses.   No murmur heard. Pulmonary/Chest: Effort normal. No stridor. No respiratory distress. She has wheezes (occasional). She has no rales.  Abdominal: Soft. Bowel sounds are normal. She exhibits no distension. There is no tenderness. There is no rebound and no guarding.  Musculoskeletal: Normal  range of motion.  Neurological: She is alert and oriented to person, place, and time.  Skin: Skin is warm and dry. No rash noted.  Psychiatric: She has a normal mood and affect. Her behavior is normal.    ED Course  Procedures (including critical care time) Labs Review Labs Reviewed - No data to display Imaging Review Dg Chest 2 View  08/17/2013   CLINICAL DATA:  Cough.  EXAM: CHEST  2 VIEW  COMPARISON:  DG CHEST 2 VIEW dated 10/09/2012  FINDINGS: Trachea is midline. Heart size normal. Linear opacity in the left midlung zone. Lungs are otherwise grossly clear. No pleural fluid.  IMPRESSION: Linear opacity in the left perihilar region has the appearance of atelectasis. No definite airspace consolidation.   Electronically Signed   By: Leanna BattlesMelinda  Blietz M.D.   On: 08/17/2013 08:53  All radiology studies independently viewed by me.     EKG Interpretation   None       MDM   1. Influenza-like illness    28 yo female presenting with a little of a day's duration of fevers, aches, and URI symptoms.  Nontoxic, not distressed.  No fever here, but took ibuprofen PTA.  Plan CXR and albuterol (mild, occasional wheezing, hx of "exercise induced asthma").  She appears stable for outpatient treatment.    CXR with some atelectasis, but no consolidation.  will dc with tamiflu.      Candyce ChurnJohn David Kandance Yano III, MD 08/17/13 [id number]

## 2013-08-17 NOTE — ED Notes (Signed)
Pain a/ox4 complaint of generalized pain with 5/10 at present time. Pt reports pain worsens with fever.

## 2013-08-17 NOTE — Discharge Instructions (Signed)

## 2014-02-01 ENCOUNTER — Encounter: Payer: Self-pay | Admitting: Physical Medicine & Rehabilitation

## 2014-02-19 ENCOUNTER — Emergency Department (HOSPITAL_COMMUNITY)
Admission: EM | Admit: 2014-02-19 | Discharge: 2014-02-20 | Disposition: A | Discharge status: Home / Self Care | Payer: Medicaid Other | Attending: Emergency Medicine | Admitting: Emergency Medicine

## 2014-02-19 ENCOUNTER — Encounter (HOSPITAL_COMMUNITY): Payer: Self-pay | Admitting: Emergency Medicine

## 2014-02-19 DIAGNOSIS — S335XXA Sprain of ligaments of lumbar spine, initial encounter: Secondary | ICD-10-CM | POA: Insufficient documentation

## 2014-02-19 DIAGNOSIS — Y929 Unspecified place or not applicable: Secondary | ICD-10-CM | POA: Diagnosis not present

## 2014-02-19 DIAGNOSIS — J45909 Unspecified asthma, uncomplicated: Secondary | ICD-10-CM | POA: Diagnosis not present

## 2014-02-19 DIAGNOSIS — W010XXA Fall on same level from slipping, tripping and stumbling without subsequent striking against object, initial encounter: Secondary | ICD-10-CM | POA: Insufficient documentation

## 2014-02-19 DIAGNOSIS — IMO0002 Reserved for concepts with insufficient information to code with codable children: Secondary | ICD-10-CM | POA: Diagnosis present

## 2014-02-19 DIAGNOSIS — Z8739 Personal history of other diseases of the musculoskeletal system and connective tissue: Secondary | ICD-10-CM | POA: Diagnosis not present

## 2014-02-19 DIAGNOSIS — S99919A Unspecified injury of unspecified ankle, initial encounter: Secondary | ICD-10-CM | POA: Diagnosis not present

## 2014-02-19 DIAGNOSIS — Z791 Long term (current) use of non-steroidal anti-inflammatories (NSAID): Secondary | ICD-10-CM | POA: Insufficient documentation

## 2014-02-19 DIAGNOSIS — F17200 Nicotine dependence, unspecified, uncomplicated: Secondary | ICD-10-CM | POA: Diagnosis not present

## 2014-02-19 DIAGNOSIS — Y9301 Activity, walking, marching and hiking: Secondary | ICD-10-CM | POA: Insufficient documentation

## 2014-02-19 DIAGNOSIS — Z8619 Personal history of other infectious and parasitic diseases: Secondary | ICD-10-CM | POA: Diagnosis not present

## 2014-02-19 DIAGNOSIS — Z79899 Other long term (current) drug therapy: Secondary | ICD-10-CM | POA: Diagnosis not present

## 2014-02-19 DIAGNOSIS — G8929 Other chronic pain: Secondary | ICD-10-CM | POA: Insufficient documentation

## 2014-02-19 DIAGNOSIS — S8990XA Unspecified injury of unspecified lower leg, initial encounter: Secondary | ICD-10-CM | POA: Diagnosis not present

## 2014-02-19 DIAGNOSIS — S39012A Strain of muscle, fascia and tendon of lower back, initial encounter: Secondary | ICD-10-CM

## 2014-02-19 DIAGNOSIS — S99929A Unspecified injury of unspecified foot, initial encounter: Secondary | ICD-10-CM

## 2014-02-19 MED ORDER — HYDROMORPHONE HCL PF 2 MG/ML IJ SOLN
2.0000 mg | Freq: Once | INTRAMUSCULAR | Status: AC
  Administered 2014-02-19: 2 mg via INTRAMUSCULAR
  Filled 2014-02-19: qty 1

## 2014-02-19 NOTE — ED Provider Notes (Signed)
CSN: [id number]     Arrival date & time 02/19/14  2255 History   First MD Initiated Contact with Patient 02/19/14 2313     Chief Complaint  Patient presents with  . Back Pain  . Leg Pain   HPI Pt has herniated disc history.  Has chronic back pain for which she usually takes 10 mg percocet.  Her doctor stopped prescribing them and she has been referred to pain management.  She has an appointment in September.  Tonight she was walking her dog and slipped.  She did not hit the ground but her leg extended.  She has pain in the lower back moving to the right leg.   The pain increases with walking and movement. Past Medical History  Diagnosis Date  . Chronic back pain   . Kienbock's disease   . Morbid obesity   . Headache(784.0)   . Asthma     exercise induced  . Trichomonas    Past Surgical History  Procedure Laterality Date  . Cesarean section    . Dilation and curettage of uterus    . Hernia repair    . Choley  gall bladder  . Cholecystectomy    . Tooth extraction      17 teeth extracted  . Dilation and evacuation  05/21/2011    Procedure: DILATATION AND EVACUATION (D&E);  Surgeon: Lazaro Arms, MD;  Location: [facility];  Service: Gynecology;  Laterality: N/A;  . Tubal ligation     Family History  Problem Relation Age of Onset  . Diabetes Mother   . Hypertension Brother   . Anesthesia problems Neg Hx    History  Substance Use Topics  . Smoking status: Current Every Day Smoker -- 1.00 packs/day for 17 years  . Smokeless tobacco: Never Used  . Alcohol Use: No   OB History   Grav Para Term Preterm Abortions TAB SAB Ect Mult Living   7 4 4  0 3 0 3 0 0 4     Review of Systems  All other systems reviewed and are negative.     Allergies  Bee venom; Ultram; Compazine; Magnesium-containing compounds; and Cymbalta  Home Medications   Prior to Admission medications   Medication Sig Start Date End Date Taking? Authorizing Provider  diltiazem (CARDIZEM) 120 MG tablet Take  120 mg by mouth 4 (four) times daily.   Yes Historical Provider, MD  ibuprofen (ADVIL,MOTRIN) 200 MG tablet Take 800 mg by mouth every 6 (six) hours as needed.   Yes Historical Provider, MD  lisinopril (PRINIVIL,ZESTRIL) 20 MG tablet Take 20 mg by mouth daily.   Yes Historical Provider, MD  albuterol (PROVENTIL HFA;VENTOLIN HFA) 108 (90 BASE) MCG/ACT inhaler Inhale 1-2 puffs into the lungs every 4 (four) hours as needed for wheezing or shortness of breath. 10/10/12   Olivia Mackie, MD  cyclobenzaprine (FLEXERIL) 5 MG tablet Take 1 tablet (5 mg total) by mouth 3 (three) times daily as needed for muscle spasms. 02/20/14   Md Linwood Dibbles, MD  naproxen (NAPROSYN) 500 MG tablet Take 1 tablet (500 mg total) by mouth 2 (two) times daily. 02/20/14   Md Linwood Dibbles, MD  oseltamivir (TAMIFLU) 75 MG capsule Take 1 capsule (75 mg total) by mouth every 12 (twelve) hours. 08/17/13   Candyce Churn III, MD  oxyCODONE-acetaminophen (PERCOCET) 10-325 MG per tablet Take 1 tablet by mouth every 6 (six) hours as needed for pain. For pain 02/20/14   Md Linwood Dibbles, MD  Pseudoeph-Doxylamine-DM-APAP (NYQUIL PO) Take 30 mLs by mouth at bedtime.    Historical Provider, MD  Pseudoephedrine-APAP-DM (DAYQUIL PO) Take 30 mLs by mouth daily.    Historical Provider, MD   BP 143/108  Pulse 107  Temp(Src) 98.2 F (36.8 C) (Oral)  Resp 24  Ht 5\' 5"  (1.651 m)  Wt 230 lb (104.327 kg)  BMI 38.27 kg/m2  SpO2 98%  LMP 02/12/2014 Physical Exam  Nursing note and vitals reviewed. Constitutional: She appears well-developed and well-nourished.  HENT:  Head: Normocephalic and atraumatic.  Right Ear: External ear normal.  Left Ear: External ear normal.  Nose: Nose normal.  Eyes: Conjunctivae and EOM are normal.  Neck: Neck supple. No tracheal deviation present.  Pulmonary/Chest: Effort normal. No stridor. No respiratory distress.  Musculoskeletal: She exhibits no edema and no tenderness.       Lumbar back: She exhibits decreased range of  motion, pain and spasm. She exhibits no swelling and no edema.  Neurological: She is alert. She is not disoriented. No cranial nerve deficit or sensory deficit. She exhibits normal muscle tone. Coordination normal.  Reflex Scores:      Patellar reflexes are 2+ on the right side and 2+ on the left side.      Achilles reflexes are 2+ on the right side and 2+ on the left side. Skin: Skin is warm and dry. No rash noted. She is not diaphoretic. No erythema.  Psychiatric: She has a normal mood and affect. Her behavior is normal. Thought content normal.    ED Course  Procedures (including critical care time) Medications  HYDROmorphone (DILAUDID) injection 2 mg (2 mg Intramuscular Given 02/19/14 2327)    MDM   Final diagnoses:  Lumbar strain, initial encounter    No sign of acute neurological or vascular emergency associated with pt's back pain.  May have a component of sciatica.  Safe for outpatient follow up.  Rx pain meds for a few days.  Chronic pain meds will need to come from her doctor.     Md Linwood DibblesJon Oden Lindaman, MD 02/20/14 [phone]

## 2014-02-20 MED ORDER — NAPROXEN 500 MG PO TABS: 500.0000 mg | ORAL_TABLET | Freq: Two times a day (BID) | ORAL | Status: DC

## 2014-02-20 MED ORDER — OXYCODONE-ACETAMINOPHEN 10-325 MG PO TABS: 1.0000 | ORAL_TABLET | Freq: Four times a day (QID) | ORAL | Status: DC | PRN

## 2014-02-20 MED ORDER — CYCLOBENZAPRINE HCL 5 MG PO TABS: 5.0000 mg | ORAL_TABLET | Freq: Three times a day (TID) | ORAL | Status: DC | PRN

## 2014-02-20 NOTE — Discharge Instructions (Signed)
Back Pain, Adult °Back pain is very common. The pain often gets better over time. The cause of back pain is usually not dangerous. Most people can learn to manage their back pain on their own.  °HOME CARE  °· Stay active. Start with short walks on flat ground if you can. Try to walk farther each day. °· Do not sit, drive, or stand in one place for more than 30 minutes. Do not stay in bed. °· Do not avoid exercise or work. Activity can help your back heal faster. °· Be careful when you bend or lift an object. Bend at your knees, keep the object close to you, and do not twist. °· Sleep on a firm mattress. Lie on your side, and bend your knees. If you lie on your back, put a pillow under your knees. °· Only take medicines as told by your doctor. °· Put ice on the injured area. °¨ Put ice in a plastic bag. °¨ Place a towel between your skin and the bag. °¨ Leave the ice on for 15-20 minutes, 03-04 times a day for the first 2 to 3 days. After that, you can switch between ice and heat packs. °· Ask your doctor about back exercises or massage. °· Avoid feeling anxious or stressed. Find good ways to deal with stress, such as exercise. °GET HELP RIGHT AWAY IF:  °· Your pain does not go away with rest or medicine. °· Your pain does not go away in 1 week. °· You have new problems. °· You do not feel well. °· The pain spreads into your legs. °· You cannot control when you poop (bowel movement) or pee (urinate). °· Your arms or legs feel weak or lose feeling (numbness). °· You feel sick to your stomach (nauseous) or throw up (vomit). °· You have belly (abdominal) pain. °· You feel like you may pass out (faint). °MAKE SURE YOU:  °· Understand these instructions. °· Will watch your condition. °· Will get help right away if you are not doing well or get worse. °Document Released: 12/17/2007 Document Revised: 09/22/2011 Document Reviewed: 11/01/2013 °[employer]® Patient Information ©2015 [employer]. This information is not intended  to replace advice given to you by your health care provider. Make sure you discuss any questions you have with your health care provider. ° °

## 2014-03-24 ENCOUNTER — Encounter: Payer: Medicaid Other | Attending: Physical Medicine & Rehabilitation

## 2014-03-24 ENCOUNTER — Encounter: Payer: Self-pay | Admitting: Physical Medicine & Rehabilitation

## 2014-03-24 ENCOUNTER — Ambulatory Visit (HOSPITAL_BASED_OUTPATIENT_CLINIC_OR_DEPARTMENT_OTHER): Payer: Medicaid Other | Admitting: Physical Medicine & Rehabilitation

## 2014-03-24 VITALS — BP 126/76 | HR 91 | Resp 14 | Wt 236.0 lb

## 2014-03-24 DIAGNOSIS — M545 Low back pain, unspecified: Secondary | ICD-10-CM

## 2014-03-24 DIAGNOSIS — Z79899 Other long term (current) drug therapy: Secondary | ICD-10-CM | POA: Insufficient documentation

## 2014-03-24 DIAGNOSIS — G894 Chronic pain syndrome: Secondary | ICD-10-CM

## 2014-03-24 DIAGNOSIS — G8929 Other chronic pain: Secondary | ICD-10-CM

## 2014-03-24 DIAGNOSIS — Z5181 Encounter for therapeutic drug level monitoring: Secondary | ICD-10-CM | POA: Insufficient documentation

## 2014-03-24 NOTE — Patient Instructions (Signed)
The x-rays of the neck back knees that I reviewed today looked normal. I would like you to get the MRI results from the other doctors office that you had seen in the past since I do not have access to this information. Without some abnormality on imaging reports I cannot prescribe strong pain medicines.
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
## 2014-03-30 ENCOUNTER — Telehealth: Payer: Self-pay | Admitting: Physical Medicine & Rehabilitation

## 2014-03-30 NOTE — Telephone Encounter (Signed)
Marylene Land from [facility] called regarding the requisition for UDS on [name]. Marylene Land stated she had a requisition with no urine specimen. Without the urine specimen, the requisition would be shredded.

## 2014-05-15 ENCOUNTER — Encounter: Payer: Self-pay | Admitting: Physical Medicine & Rehabilitation

## 2015-06-14 ENCOUNTER — Emergency Department (HOSPITAL_COMMUNITY)
Admission: EM | Admit: 2015-06-14 | Discharge: 2015-06-14 | Disposition: A | Discharge status: Home / Self Care | Payer: Medicaid Other | Attending: Emergency Medicine | Admitting: Emergency Medicine

## 2015-06-14 ENCOUNTER — Encounter (HOSPITAL_COMMUNITY): Payer: Self-pay

## 2015-06-14 ENCOUNTER — Emergency Department (HOSPITAL_COMMUNITY): Payer: Medicaid Other

## 2015-06-14 DIAGNOSIS — S301XXA Contusion of abdominal wall, initial encounter: Secondary | ICD-10-CM | POA: Diagnosis not present

## 2015-06-14 DIAGNOSIS — Y92410 Unspecified street and highway as the place of occurrence of the external cause: Secondary | ICD-10-CM | POA: Insufficient documentation

## 2015-06-14 DIAGNOSIS — S29001A Unspecified injury of muscle and tendon of front wall of thorax, initial encounter: Secondary | ICD-10-CM | POA: Insufficient documentation

## 2015-06-14 DIAGNOSIS — Z9889 Other specified postprocedural states: Secondary | ICD-10-CM | POA: Diagnosis not present

## 2015-06-14 DIAGNOSIS — Z79899 Other long term (current) drug therapy: Secondary | ICD-10-CM | POA: Insufficient documentation

## 2015-06-14 DIAGNOSIS — Y998 Other external cause status: Secondary | ICD-10-CM | POA: Insufficient documentation

## 2015-06-14 DIAGNOSIS — Z3202 Encounter for pregnancy test, result negative: Secondary | ICD-10-CM | POA: Diagnosis not present

## 2015-06-14 DIAGNOSIS — Z791 Long term (current) use of non-steroidal anti-inflammatories (NSAID): Secondary | ICD-10-CM | POA: Insufficient documentation

## 2015-06-14 DIAGNOSIS — S0083XA Contusion of other part of head, initial encounter: Secondary | ICD-10-CM | POA: Insufficient documentation

## 2015-06-14 DIAGNOSIS — Z9049 Acquired absence of other specified parts of digestive tract: Secondary | ICD-10-CM | POA: Insufficient documentation

## 2015-06-14 DIAGNOSIS — Y9389 Activity, other specified: Secondary | ICD-10-CM | POA: Insufficient documentation

## 2015-06-14 DIAGNOSIS — S199XXA Unspecified injury of neck, initial encounter: Secondary | ICD-10-CM | POA: Diagnosis not present

## 2015-06-14 DIAGNOSIS — G8929 Other chronic pain: Secondary | ICD-10-CM | POA: Diagnosis not present

## 2015-06-14 DIAGNOSIS — R0789 Other chest pain: Secondary | ICD-10-CM | POA: Insufficient documentation

## 2015-06-14 DIAGNOSIS — F17200 Nicotine dependence, unspecified, uncomplicated: Secondary | ICD-10-CM | POA: Insufficient documentation

## 2015-06-14 DIAGNOSIS — J45909 Unspecified asthma, uncomplicated: Secondary | ICD-10-CM | POA: Insufficient documentation

## 2015-06-14 DIAGNOSIS — S20219A Contusion of unspecified front wall of thorax, initial encounter: Secondary | ICD-10-CM

## 2015-06-14 DIAGNOSIS — S3991XA Unspecified injury of abdomen, initial encounter: Secondary | ICD-10-CM | POA: Diagnosis present

## 2015-06-14 DIAGNOSIS — Z9851 Tubal ligation status: Secondary | ICD-10-CM | POA: Insufficient documentation

## 2015-06-14 DIAGNOSIS — Z8619 Personal history of other infectious and parasitic diseases: Secondary | ICD-10-CM | POA: Insufficient documentation

## 2015-06-14 LAB — POC URINE PREG, ED: Preg Test, Ur: NEGATIVE

## 2015-06-14 MED ORDER — FENTANYL CITRATE (PF) 100 MCG/2ML IJ SOLN
50.0000 ug | Freq: Once | INTRAMUSCULAR | Status: AC
  Administered 2015-06-14: 50 ug via INTRAVENOUS
  Filled 2015-06-14: qty 2

## 2015-06-14 MED ORDER — SODIUM CHLORIDE 0.9 % IV BOLUS (SEPSIS)
500.0000 mL | Freq: Once | INTRAVENOUS | Status: AC
  Administered 2015-06-14: 500 mL via INTRAVENOUS

## 2015-06-14 MED ORDER — IOHEXOL 300 MG/ML  SOLN
100.0000 mL | Freq: Once | INTRAMUSCULAR | Status: AC | PRN
  Administered 2015-06-14: 100 mL via INTRAVENOUS

## 2015-06-14 MED ORDER — ONDANSETRON HCL 4 MG/2ML IJ SOLN
4.0000 mg | Freq: Once | INTRAMUSCULAR | Status: AC
  Administered 2015-06-14: 4 mg via INTRAVENOUS
  Filled 2015-06-14: qty 2

## 2015-06-14 MED ORDER — CYCLOBENZAPRINE HCL 10 MG PO TABS: 10.0000 mg | ORAL_TABLET | Freq: Two times a day (BID) | ORAL | Status: DC | PRN

## 2015-06-14 MED ORDER — OXYCODONE-ACETAMINOPHEN 5-325 MG PO TABS: 1.0000 | ORAL_TABLET | ORAL | Status: DC | PRN

## 2015-06-14 NOTE — Discharge Instructions (Signed)
Xrays showed no internal injuries.  You will be sore for several days.  Ice pack.  Medication for pain and muscle relaxation
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
## 2015-08-15 ENCOUNTER — Encounter: Payer: Self-pay | Admitting: Neurology

## 2015-08-15 ENCOUNTER — Ambulatory Visit (INDEPENDENT_AMBULATORY_CARE_PROVIDER_SITE_OTHER): Payer: Medicaid Other | Admitting: Neurology

## 2015-08-15 VITALS — BP 136/96 | HR 92 | Ht 65.0 in | Wt 221.0 lb

## 2015-08-15 DIAGNOSIS — R413 Other amnesia: Secondary | ICD-10-CM | POA: Diagnosis not present

## 2015-08-15 DIAGNOSIS — G43009 Migraine without aura, not intractable, without status migrainosus: Secondary | ICD-10-CM | POA: Diagnosis not present

## 2015-08-15 DIAGNOSIS — G43909 Migraine, unspecified, not intractable, without status migrainosus: Secondary | ICD-10-CM | POA: Insufficient documentation

## 2015-08-15 MED ORDER — NORTRIPTYLINE HCL 10 MG PO CAPS: ORAL_CAPSULE | ORAL | Status: DC

## 2015-08-15 MED ORDER — RIZATRIPTAN BENZOATE 5 MG PO TBDP: 5.0000 mg | ORAL_TABLET | ORAL | Status: DC | PRN
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
## 2015-08-17 LAB — ANA: ANA: POSITIVE — AB

## 2015-08-17 LAB — TSH: TSH: 1.2 u[IU]/mL (ref 0.450–4.500)

## 2015-08-17 LAB — VITAMIN B12: VITAMIN B 12: 209 pg/mL — AB (ref 211–946)

## 2015-08-17 LAB — SEDIMENTATION RATE: Sed Rate: 13 mm/hr (ref 0–32)

## 2015-08-17 LAB — C-REACTIVE PROTEIN: CRP: 8.4 mg/L — AB (ref 0.0–4.9)

## 2015-08-17 LAB — RPR: RPR: NONREACTIVE

## 2015-08-20 ENCOUNTER — Ambulatory Visit
Admission: RE | Admit: 2015-08-20 | Discharge: 2015-08-20 | Disposition: A | Discharge status: Home / Self Care | Payer: Medicaid Other | Source: Ambulatory Visit | Attending: Neurology | Admitting: Neurology

## 2015-08-20 ENCOUNTER — Telehealth: Payer: Self-pay | Admitting: Neurology

## 2015-08-20 DIAGNOSIS — E538 Deficiency of other specified B group vitamins: Secondary | ICD-10-CM | POA: Insufficient documentation

## 2015-08-20 DIAGNOSIS — G43009 Migraine without aura, not intractable, without status migrainosus: Secondary | ICD-10-CM | POA: Diagnosis not present

## 2015-08-20 DIAGNOSIS — R413 Other amnesia: Secondary | ICD-10-CM | POA: Diagnosis not present

## 2015-08-20 NOTE — Telephone Encounter (Signed)
Please call patient, evidence of vitamin B12 deficiency, B12 level was 2 0 9, positive ANA of unknown clinical significance, she needs to have repeat laboratory, order was placed, she can come in without appointment,   She needs B12 supplement, 1000 g IM daily for one week, then weekly for one month, then monthly injection, this can be done at home, primary care's office, or at our office at her convenience

## 2015-08-21 ENCOUNTER — Other Ambulatory Visit: Payer: Self-pay | Admitting: *Deleted

## 2015-08-21 DIAGNOSIS — E538 Deficiency of other specified B group vitamins: Secondary | ICD-10-CM

## 2015-08-21 MED ORDER — CYANOCOBALAMIN 1000 MCG/ML IJ SOLN
1000.0000 ug | Freq: Once | INTRAMUSCULAR | Status: AC
  Administered 2015-08-23: 1000 ug via INTRAMUSCULAR

## 2015-08-21 MED ORDER — CYANOCOBALAMIN 1000 MCG/ML IJ SOLN: INTRAMUSCULAR | Status: DC

## 2015-08-21 MED ORDER — "SYRINGE/NEEDLE (DISP) 23G X 1-1/2"" 3 ML MISC": Status: DC

## 2015-08-21 MED ORDER — "NEEDLE (DISP) 18G X 1"" MISC": Status: DC

## 2015-08-21 NOTE — Telephone Encounter (Addendum)
Spoke to patient - she would prefer to have the injections at home.  Say her fiancee has experience giving her both insulin (while pregnant) and sumatriptan injections.  She is coming in for repeat labs on 08/23/15. After her labs are drawn, she will have her first injection at our office.  I have ask that she bring her fiancee with her so that I can make sure he knows how to properly give the injections.  Rx for medication and supplies for the remaining injections have been sent to the pharmacy.

## 2015-08-21 NOTE — Addendum Note (Signed)
Addended by: Lindell Spar C on: 08/21/2015 10:31 AM   Modules accepted: Orders

## 2015-08-21 NOTE — Addendum Note (Signed)
Addended by: Lindell Spar C on: 08/21/2015 10:34 AM   Modules accepted: Orders

## 2015-08-21 NOTE — Addendum Note (Signed)
Addended by: Lindell Spar C on: 08/21/2015 10:42 AM   Modules accepted: Orders

## 2015-08-22 ENCOUNTER — Other Ambulatory Visit: Payer: Self-pay | Admitting: *Deleted

## 2015-08-22 MED ORDER — SYRINGE (DISPOSABLE) 3 ML MISC: Status: DC

## 2015-08-22 MED ORDER — "NEEDLE (DISP) 23G X 1-1/4"" MISC": Status: DC

## 2015-08-22 NOTE — Telephone Encounter (Signed)
Pt called said she could not come for labs or B12 tomorrow (08/23/15) because she has MRI appt tomorrow at the same time. She is inquiring if she could in Monday 08/28/15. Please call her at [phone]

## 2015-08-22 NOTE — Telephone Encounter (Signed)
Left message letting her know Monday would be fine as long as her fiancee could come too (he needs to be educated on giving the injections).

## 2015-08-23 ENCOUNTER — Other Ambulatory Visit (INDEPENDENT_AMBULATORY_CARE_PROVIDER_SITE_OTHER): Payer: Self-pay

## 2015-08-23 ENCOUNTER — Ambulatory Visit (INDEPENDENT_AMBULATORY_CARE_PROVIDER_SITE_OTHER): Payer: Medicaid Other | Admitting: *Deleted

## 2015-08-23 DIAGNOSIS — E538 Deficiency of other specified B group vitamins: Secondary | ICD-10-CM

## 2015-08-23 DIAGNOSIS — Z0289 Encounter for other administrative examinations: Secondary | ICD-10-CM

## 2015-08-27 LAB — ANA W/REFLEX IF POSITIVE
ANA: POSITIVE — AB
Anti JO-1: <0.2 AI (ref 0.0–0.9)
Chromatin Ab SerPl-aCnc: <0.2 AI (ref 0.0–0.9)
DSDNA AB: 2 [IU]/mL (ref 0–9)
ENA RNP AB: 0.7 AI (ref 0.0–0.9)
ENA SSA (RO) Ab: 1.5 AI — ABNORMAL HIGH (ref 0.0–0.9)
Scleroderma SCL-70: <0.2 AI (ref 0.0–0.9)

## 2015-08-27 LAB — HOMOCYSTEINE: Homocysteine: 13.6 umol/L (ref 0.0–15.0)

## 2015-08-27 LAB — METHYLMALONIC ACID, SERUM: Methylmalonic Acid: 210 nmol/L (ref 0–378)

## 2015-08-27 LAB — VITAMIN B12: VITAMIN B 12: 237 pg/mL (ref 211–946)

## 2015-08-27 LAB — FOLATE: FOLATE: 6.5 ng/mL (ref >3.0)

## 2015-09-19 ENCOUNTER — Telehealth: Payer: Self-pay | Admitting: *Deleted

## 2015-09-19 ENCOUNTER — Ambulatory Visit: Payer: Self-pay | Admitting: Neurology

## 2015-09-19 NOTE — Telephone Encounter (Signed)
Instructed to arrived to our office at 3:45pm for her 4pm appt.  She arrived at 4:30pm and was unable to be seen by Dr. Terrace ArabiaYan.

## 2015-09-25 ENCOUNTER — Encounter: Payer: Self-pay | Admitting: Neurology

## 2015-09-25 ENCOUNTER — Ambulatory Visit (INDEPENDENT_AMBULATORY_CARE_PROVIDER_SITE_OTHER): Payer: Medicaid Other | Admitting: Neurology

## 2015-09-25 VITALS — BP 146/92 | HR 72 | Ht 65.0 in | Wt 221.0 lb

## 2015-09-25 DIAGNOSIS — G43009 Migraine without aura, not intractable, without status migrainosus: Secondary | ICD-10-CM | POA: Diagnosis not present

## 2015-09-25 DIAGNOSIS — E538 Deficiency of other specified B group vitamins: Secondary | ICD-10-CM

## 2015-09-25 MED ORDER — VENLAFAXINE HCL ER 37.5 MG PO CP24: ORAL_CAPSULE | ORAL | Status: DC

## 2015-09-25 MED ORDER — RIZATRIPTAN BENZOATE 10 MG PO TBDP: 10.0000 mg | ORAL_TABLET | ORAL | Status: DC | PRN
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
## 2015-11-14 ENCOUNTER — Ambulatory Visit (HOSPITAL_COMMUNITY): Payer: Medicaid Other

## 2015-12-26 ENCOUNTER — Ambulatory Visit: Payer: Medicaid Other | Admitting: Nurse Practitioner

## 2015-12-27 ENCOUNTER — Encounter: Payer: Self-pay | Admitting: Nurse Practitioner

## 2016-02-01 ENCOUNTER — Encounter (HOSPITAL_COMMUNITY): Payer: Self-pay

## 2016-02-01 ENCOUNTER — Inpatient Hospital Stay (HOSPITAL_COMMUNITY)
Admission: AD | Admit: 2016-02-01 | Discharge: 2016-02-01 | Disposition: A | Discharge status: Home / Self Care | Payer: Medicaid Other | Source: Ambulatory Visit | Attending: Obstetrics and Gynecology | Admitting: Obstetrics and Gynecology

## 2016-02-01 DIAGNOSIS — Z3201 Encounter for pregnancy test, result positive: Secondary | ICD-10-CM

## 2016-02-01 DIAGNOSIS — Z3A18 18 weeks gestation of pregnancy: Secondary | ICD-10-CM | POA: Diagnosis not present

## 2016-02-01 DIAGNOSIS — O98812 Other maternal infectious and parasitic diseases complicating pregnancy, second trimester: Secondary | ICD-10-CM | POA: Diagnosis not present

## 2016-02-01 DIAGNOSIS — B372 Candidiasis of skin and nail: Secondary | ICD-10-CM | POA: Diagnosis not present

## 2016-02-01 DIAGNOSIS — O10012 Pre-existing essential hypertension complicating pregnancy, second trimester: Secondary | ICD-10-CM | POA: Diagnosis not present

## 2016-02-01 DIAGNOSIS — O10912 Unspecified pre-existing hypertension complicating pregnancy, second trimester: Secondary | ICD-10-CM | POA: Diagnosis not present

## 2016-02-01 DIAGNOSIS — O99212 Obesity complicating pregnancy, second trimester: Secondary | ICD-10-CM | POA: Diagnosis not present

## 2016-02-01 DIAGNOSIS — I10 Essential (primary) hypertension: Secondary | ICD-10-CM

## 2016-02-01 DIAGNOSIS — O99352 Diseases of the nervous system complicating pregnancy, second trimester: Secondary | ICD-10-CM | POA: Insufficient documentation

## 2016-02-01 DIAGNOSIS — J45909 Unspecified asthma, uncomplicated: Secondary | ICD-10-CM | POA: Diagnosis not present

## 2016-02-01 DIAGNOSIS — G8929 Other chronic pain: Secondary | ICD-10-CM | POA: Diagnosis not present

## 2016-02-01 DIAGNOSIS — Z9851 Tubal ligation status: Secondary | ICD-10-CM | POA: Diagnosis not present

## 2016-02-01 DIAGNOSIS — F17210 Nicotine dependence, cigarettes, uncomplicated: Secondary | ICD-10-CM | POA: Diagnosis not present

## 2016-02-01 DIAGNOSIS — Z711 Person with feared health complaint in whom no diagnosis is made: Secondary | ICD-10-CM

## 2016-02-01 DIAGNOSIS — O99332 Smoking (tobacco) complicating pregnancy, second trimester: Secondary | ICD-10-CM | POA: Insufficient documentation

## 2016-02-01 DIAGNOSIS — O99512 Diseases of the respiratory system complicating pregnancy, second trimester: Secondary | ICD-10-CM | POA: Diagnosis not present

## 2016-02-01 LAB — URINE MICROSCOPIC-ADD ON

## 2016-02-01 LAB — URINALYSIS, ROUTINE W REFLEX MICROSCOPIC
Bilirubin Urine: NEGATIVE
Glucose, UA: NEGATIVE mg/dL
Hgb urine dipstick: NEGATIVE
Ketones, ur: NEGATIVE mg/dL
LEUKOCYTES UA: NEGATIVE
NITRITE: NEGATIVE
PROTEIN: 30 mg/dL — AB
SPECIFIC GRAVITY, URINE: 1.025 (ref 1.005–1.030)
pH: 6 (ref 5.0–8.0)

## 2016-02-01 LAB — POCT PREGNANCY, URINE: Preg Test, Ur: POSITIVE — AB

## 2016-02-01 MED ORDER — LABETALOL HCL 100 MG PO TABS: 100.0000 mg | ORAL_TABLET | Freq: Two times a day (BID) | ORAL | Status: DC

## 2016-02-01 MED ORDER — MICONAZOLE NITRATE 2 % POWD: 1.0000 | Freq: Two times a day (BID) | Status: DC | PRN

## 2016-02-01 NOTE — Discharge Instructions (Signed)

## 2016-02-01 NOTE — Progress Notes (Signed)
Venia CarbonJennifer Rasch NP in to discuss d/c plan with pt. Written and verbal d/c instructions given and understanding voiced.

## 2016-02-01 NOTE — MAU Provider Note (Signed)
History     CSN: [id number]  Arrival date and time: 02/01/16 2112   First Provider Initiated Contact with Patient 02/01/16 2211      Chief Complaint  Patient presents with  . Possible Pregnancy   HPI    Ms.[name] is a 30 y.o. female with a history of chronic HTN [phone] @ [redacted]w[redacted]d here for a pregnancy test. She had a tubal ligation in 2008; she never had a reversal like she wanted. This is a desired pregnancy. She is requesting an [place] to check on the baby. She is not having any problems other than a possible yeast infection under her abdomen.   She had a miscarriage in 2012 with D&C and is very anxious about the pregnancy.  She denies vaginal bleeding.    OB History    Gravida Para Term Preterm AB TAB SAB Ectopic Multiple Living   8 4 4  0 3 0 3 0 0 4      Past Medical History  Diagnosis Date  . Chronic back pain   . Kienbock's disease   . Morbid obesity (HCC)   . Headache(784.0)   . Asthma     exercise induced  . Trichomonas   . Headache     Past Surgical History  Procedure Laterality Date  . Cesarean section    . Dilation and curettage of uterus    . Hernia repair    . Choley  gall bladder  . Cholecystectomy    . Tooth extraction      17 teeth extracted  . Dilation and evacuation  05/21/2011    Procedure: DILATATION AND EVACUATION (D&E);  Surgeon: Lazaro Arms, MD;  Location: [facility];  Service: Gynecology;  Laterality: N/A;  . Tubal ligation      Family History  Problem Relation Age of Onset  . Diabetes Mother   . Hypertension Brother   . Anesthesia problems Neg Hx     Social History  Substance Use Topics  . Smoking status: Current Every Day Smoker -- 1.00 packs/day for 17 years  . Smokeless tobacco: Never Used  . Alcohol Use: No    Allergies:  Allergies  Allergen Reactions  . Bee Venom Anaphylaxis  . Ultram [Tramadol Hcl] Anaphylaxis and Hives  . Compazine [Prochlorperazine Edisylate] Other (See Comments)    Made patient very  depressed  . Magnesium-Containing Compounds Hives  . Cymbalta [Duloxetine Hcl] Anxiety    Prescriptions prior to admission  Medication Sig Dispense Refill Last Dose  . acetaminophen (TYLENOL) 500 MG tablet Take 1,000 mg by mouth every 6 (six) hours as needed for headache.   Past Week at Unknown time  . Calcium Carbonate-Simethicone (ROLAIDS MULTI-SYMPTOM PO) Take 2 tablets by mouth daily as needed (heart burn).   Past Week at Unknown time  . cyanocobalamin (,VITAMIN B-12,) 1000 MCG/ML injection Inject 1ml daily x 6 days, inject 1ml once weekly x 4 weeks, then inject 1ml monthly x 10 months thereafter. 20 mL 0 Past Week at Unknown time  . oxyCODONE-acetaminophen (PERCOCET) 10-325 MG tablet Take 0.5 tablets by mouth 3 (three) times daily.   02/01/2016 at Unknown time  . naproxen (NAPROSYN) 500 MG tablet Take 1 tablet (500 mg total) by mouth 2 (two) times daily. (Patient not taking: Reported on 02/01/2016) 30 tablet 0 Taking  . oxyCODONE-acetaminophen (PERCOCET) 5-325 MG tablet Take 1-2 tablets by mouth every 4 (four) hours as needed. (Patient not taking: Reported on 02/01/2016) 20 tablet 0 Taking  . rizatriptan (MAXALT-MLT)  10 MG disintegrating tablet Take 1 tablet (10 mg total) by mouth as needed. May repeat in 2 hours if needed (Patient not taking: Reported on 02/01/2016) 12 tablet 11 Not Taking at Unknown time  . venlafaxine XR (EFFEXOR XR) 37.5 MG 24 hr capsule One po qam xone week, then 2 tabs po qday (Patient not taking: Reported on 02/01/2016) 60 capsule 6    Results for orders placed or performed during the hospital encounter of 02/01/16 (from the past 48 hour(s))  Urinalysis, Routine w reflex microscopic (not at [facility])     Status: Abnormal   Collection Time: 02/01/16  9:35 PM  Result Value Ref Range   Color, Urine YELLOW YELLOW   APPearance CLEAR CLEAR   Specific Gravity, Urine 1.025 1.005 - 1.030   pH 6.0 5.0 - 8.0   Glucose, UA NEGATIVE NEGATIVE mg/dL   Hgb urine dipstick NEGATIVE  NEGATIVE   Bilirubin Urine NEGATIVE NEGATIVE   Ketones, ur NEGATIVE NEGATIVE mg/dL   Protein, ur 30 (A) NEGATIVE mg/dL   Nitrite NEGATIVE NEGATIVE   Leukocytes, UA NEGATIVE NEGATIVE  Urine microscopic-add on     Status: Abnormal   Collection Time: 02/01/16  9:35 PM  Result Value Ref Range   Squamous Epithelial / LPF 0-5 (A) NONE SEEN   WBC, UA 0-5 0 - 5 WBC/hpf   RBC / HPF 0-5 0 - 5 RBC/hpf   Bacteria, UA RARE (A) NONE SEEN  Pregnancy, urine POC     Status: Abnormal   Collection Time: 02/01/16  9:38 PM  Result Value Ref Range   Preg Test, Ur POSITIVE (A) NEGATIVE    Comment:        THE SENSITIVITY OF THIS METHODOLOGY IS >24 mIU/mL     Review of Systems  Constitutional: Negative for fever and chills.  Eyes: Negative for blurred vision.  Gastrointestinal: Negative for nausea, vomiting, abdominal pain, diarrhea and constipation.  Genitourinary: Negative for dysuria and urgency.  Neurological: Negative for headaches.   Physical Exam   Blood pressure 142/70, pulse 86, temperature 97.8 F (36.6 C), resp. rate 18, height 5\' 4"  (1.626 m), weight 236 lb 3.2 oz (107.14 kg), last menstrual period 09/24/2015.   Patient Vitals for the past 24 hrs:  BP Temp Pulse Resp Height Weight  02/01/16 2303 147/86 mmHg 99.3 F (37.4 C) 85 18 - -  02/01/16 2229 142/70 mmHg - 86 - - -  02/01/16 2218 134/92 mmHg - 93 - - -  02/01/16 2125 153/95 mmHg - - - - -  02/01/16 2123 - 97.8 F (36.6 C) 98 18 5\' 4"  (1.626 m) 236 lb 3.2 oz (107.14 kg)    Physical Exam  Constitutional: She is oriented to person, place, and time. She appears well-developed and well-nourished. No distress.  HENT:  Head: Normocephalic.  GI: Soft. She exhibits no distension and no mass. There is no tenderness. There is no rebound and no guarding.  Erythema noted under panus.   Musculoskeletal: Normal range of motion.  Neurological: She is alert and oriented to person, place, and time.  Skin: Skin is warm. She is not  diaphoretic.  Psychiatric: Her behavior is normal.    MAU Course  Procedures  None  MDM  + fetal heart tones Antibody screen   Assessment and Plan   A:  1. Hypertension, essential, benign   2. Physically well but worried   3. Encounter for pregnancy test, result positive   4. Yeast dermatitis     P:  Discharge home in stable condition  Borderline blood pressures: appointment made in the yellow book for urgent appointment  ABO workup with antibody screen  Rx: labetalol 100 mg BID, miconazole powder PRN  Anatomy scan ordered after 19 weeks  Prenatal vitamins daily Return precautions discussed   Duane Lope, NP 02/01/2016 10:36 PM

## 2016-02-01 NOTE — MAU Note (Signed)
Had BTL in 2008 and got pregnant in 2010 but was miscarriage and had to have D&C. No bleeding or pain. Some millk vag d/c. Has done upt and positive x 3.

## 2016-02-01 NOTE — Progress Notes (Signed)
Jennifer Rasch NP in to discuss test results and d/c plan with pt. Written and verbal d/c instructions given and understanding voiced 

## 2016-02-03 LAB — RH IG WORKUP (INCLUDES ABO/RH)
ABO/RH(D): B NEG
ANTIBODY SCREEN: NEGATIVE
GESTATIONAL AGE(WKS): 18
UNIT DIVISION: 0

## 2016-02-04 ENCOUNTER — Other Ambulatory Visit (HOSPITAL_COMMUNITY)
Admission: RE | Admit: 2016-02-04 | Discharge: 2016-02-04 | Disposition: A | Discharge status: Home / Self Care | Payer: Medicaid Other | Source: Ambulatory Visit | Attending: Advanced Practice Midwife | Admitting: Advanced Practice Midwife

## 2016-02-04 ENCOUNTER — Encounter: Payer: Self-pay | Admitting: Advanced Practice Midwife

## 2016-02-04 ENCOUNTER — Ambulatory Visit (INDEPENDENT_AMBULATORY_CARE_PROVIDER_SITE_OTHER): Payer: Medicaid Other | Admitting: Advanced Practice Midwife

## 2016-02-04 ENCOUNTER — Other Ambulatory Visit: Payer: Self-pay | Admitting: Advanced Practice Midwife

## 2016-02-04 VITALS — BP 125/88 | HR 92 | Wt 235.1 lb

## 2016-02-04 DIAGNOSIS — F1190 Opioid use, unspecified, uncomplicated: Secondary | ICD-10-CM | POA: Insufficient documentation

## 2016-02-04 DIAGNOSIS — O360120 Maternal care for anti-D [Rh] antibodies, second trimester, not applicable or unspecified: Secondary | ICD-10-CM

## 2016-02-04 DIAGNOSIS — I10 Essential (primary) hypertension: Secondary | ICD-10-CM | POA: Diagnosis not present

## 2016-02-04 DIAGNOSIS — M549 Dorsalgia, unspecified: Secondary | ICD-10-CM

## 2016-02-04 DIAGNOSIS — O9989 Other specified diseases and conditions complicating pregnancy, childbirth and the puerperium: Secondary | ICD-10-CM

## 2016-02-04 DIAGNOSIS — O10919 Unspecified pre-existing hypertension complicating pregnancy, unspecified trimester: Secondary | ICD-10-CM | POA: Insufficient documentation

## 2016-02-04 DIAGNOSIS — Z01419 Encounter for gynecological examination (general) (routine) without abnormal findings: Secondary | ICD-10-CM | POA: Insufficient documentation

## 2016-02-04 DIAGNOSIS — O99322 Drug use complicating pregnancy, second trimester: Secondary | ICD-10-CM

## 2016-02-04 DIAGNOSIS — Z1151 Encounter for screening for human papillomavirus (HPV): Secondary | ICD-10-CM | POA: Diagnosis not present

## 2016-02-04 DIAGNOSIS — O09892 Supervision of other high risk pregnancies, second trimester: Secondary | ICD-10-CM

## 2016-02-04 DIAGNOSIS — G8929 Other chronic pain: Secondary | ICD-10-CM

## 2016-02-04 DIAGNOSIS — O10912 Unspecified pre-existing hypertension complicating pregnancy, second trimester: Secondary | ICD-10-CM

## 2016-02-04 DIAGNOSIS — O34219 Maternal care for unspecified type scar from previous cesarean delivery: Secondary | ICD-10-CM | POA: Diagnosis not present

## 2016-02-04 DIAGNOSIS — Z6791 Unspecified blood type, Rh negative: Secondary | ICD-10-CM | POA: Insufficient documentation

## 2016-02-04 DIAGNOSIS — O26893 Other specified pregnancy related conditions, third trimester: Secondary | ICD-10-CM

## 2016-02-04 DIAGNOSIS — Z113 Encounter for screening for infections with a predominantly sexual mode of transmission: Secondary | ICD-10-CM | POA: Insufficient documentation

## 2016-02-04 LAB — COMPREHENSIVE METABOLIC PANEL
ALT: 6 U/L (ref 6–29)
AST: 8 U/L — AB (ref 10–30)
Albumin: 3.2 g/dL — ABNORMAL LOW (ref 3.6–5.1)
Alkaline Phosphatase: 64 U/L (ref 33–115)
BUN: 8 mg/dL (ref 7–25)
CHLORIDE: 107 mmol/L (ref 98–110)
CO2: 17 mmol/L — AB (ref 20–31)
CREATININE: 0.58 mg/dL (ref 0.50–1.10)
Calcium: 8.5 mg/dL — ABNORMAL LOW (ref 8.6–10.2)
GLUCOSE: 170 mg/dL — AB (ref 65–99)
Potassium: 3.8 mmol/L (ref 3.5–5.3)
SODIUM: 134 mmol/L — AB (ref 135–146)
Total Bilirubin: 0.3 mg/dL (ref 0.2–1.2)
Total Protein: 6.1 g/dL (ref 6.1–8.1)

## 2016-02-04 MED ORDER — ASPIRIN EC 81 MG PO TBEC: 81.0000 mg | DELAYED_RELEASE_TABLET | Freq: Every day | ORAL | 2 refills | Status: DC

## 2016-02-04 MED ORDER — PRENATAL VITAMINS 0.8 MG PO TABS: 1.0000 | ORAL_TABLET | Freq: Every day | ORAL | 12 refills | Status: DC

## 2016-02-04 NOTE — Progress Notes (Signed)
Here for initial prenatal visit. Given new patient education. C/o cramps sometimes like period.

## 2016-02-04 NOTE — Patient Instructions (Signed)
Safe Medications in Pregnancy   Acne: Benzoyl Peroxide Salicylic Acid  Backache/Headache: Tylenol: 2 regular strength every 4 hours OR              2 Extra strength every 6 hours  Colds/Coughs/Allergies: Benadryl (alcohol free) 25 mg every 6 hours as needed Breath right strips Claritin Cepacol throat lozenges Chloraseptic throat spray Cold-Eeze- up to three times per day Cough drops, alcohol free Flonase (by prescription only) Guaifenesin Mucinex Robitussin DM (plain only, alcohol free) Saline nasal spray/drops Sudafed (pseudoephedrine) & Actifed ** use only after [redacted] weeks gestation and if you do not have high blood pressure Tylenol Vicks Vaporub Zinc lozenges Zyrtec   Constipation: Colace Ducolax suppositories Fleet enema Glycerin suppositories Metamucil Milk of magnesia Miralax Senokot Smooth move tea  Diarrhea: Kaopectate Imodium A-D  *NO pepto Bismol  Hemorrhoids: Anusol Anusol HC Preparation H Tucks  Indigestion: Tums Maalox Mylanta Zantac  Pepcid  Insomnia: Benadryl (alcohol free) 25mg every 6 hours as needed Tylenol PM Unisom, no Gelcaps  Leg Cramps: Tums MagGel  Nausea/Vomiting:  Bonine Dramamine Emetrol Ginger extract Sea bands Meclizine  Nausea medication to take during pregnancy:  Unisom (doxylamine succinate 25 mg tablets) Take one tablet daily at bedtime. If symptoms are not adequately controlled, the dose can be increased to a maximum recommended dose of two tablets daily (1/2 tablet in the morning, 1/2 tablet mid-afternoon and one at bedtime). Vitamin B6 100mg tablets. Take one tablet twice a day (up to 200 mg per day).  Skin Rashes: Aveeno products Benadryl cream or 25mg every 6 hours as needed Calamine Lotion 1% cortisone cream  Yeast infection: Gyne-lotrimin 7 Monistat 7   **If taking multiple medications, please check labels to avoid duplicating the same active ingredients **take medication as directed on  the label ** Do not exceed 4000 mg of tylenol in 24 hours **Do not take medications that contain aspirin or ibuprofen       Second Trimester of Pregnancy The second trimester is from week 13 through week 28, months 4 through 6. The second trimester is often a time when you feel your best. Your body has also adjusted to being pregnant, and you begin to feel better physically. Usually, morning sickness has lessened or quit completely, you may have more energy, and you may have an increase in appetite. The second trimester is also a time when the fetus is growing rapidly. At the end of the sixth month, the fetus is about 9 inches long and weighs about 1 pounds. You will likely begin to feel the baby move (quickening) between 18 and 20 weeks of the pregnancy. BODY CHANGES Your body goes through many changes during pregnancy. The changes vary from woman to woman.   Your weight will continue to increase. You will notice your lower abdomen bulging out.  You may begin to get stretch marks on your hips, abdomen, and breasts.  You may develop headaches that can be relieved by medicines approved by your health care provider.  You may urinate more often because the fetus is pressing on your bladder.  You may develop or continue to have heartburn as a result of your pregnancy.  You may develop constipation because certain hormones are causing the muscles that push waste through your intestines to slow down.  You may develop hemorrhoids or swollen, bulging veins (varicose veins).  You may have back pain because of the weight gain and pregnancy hormones relaxing your joints between the bones in your pelvis and as   a result of a shift in weight and the muscles that support your balance.  Your breasts will continue to grow and be tender.  Your gums may bleed and may be sensitive to brushing and flossing.  Dark spots or blotches (chloasma, mask of pregnancy) may develop on your face. This will likely  fade after the baby is born.  A dark line from your belly button to the pubic area (linea nigra) may appear. This will likely fade after the baby is born.  You may have changes in your hair. These can include thickening of your hair, rapid growth, and changes in texture. Some women also have hair loss during or after pregnancy, or hair that feels dry or thin. Your hair will most likely return to normal after your baby is born. WHAT TO EXPECT AT YOUR PRENATAL VISITS During a routine prenatal visit:  You will be weighed to make sure you and the fetus are growing normally.  Your blood pressure will be taken.  Your abdomen will be measured to track your baby's growth.  The fetal heartbeat will be listened to.  Any test results from the previous visit will be discussed. Your health care provider may ask you:  How you are feeling.  If you are feeling the baby move.  If you have had any abnormal symptoms, such as leaking fluid, bleeding, severe headaches, or abdominal cramping.  If you are using any tobacco products, including cigarettes, chewing tobacco, and electronic cigarettes.  If you have any questions. Other tests that may be performed during your second trimester include:  Blood tests that check for:  Low iron levels (anemia).  Gestational diabetes (between 24 and 28 weeks).  Rh antibodies.  Urine tests to check for infections, diabetes, or protein in the urine.  An ultrasound to confirm the proper growth and development of the baby.  An amniocentesis to check for possible genetic problems.  Fetal screens for spina bifida and Down syndrome.  HIV (human immunodeficiency virus) testing. Routine prenatal testing includes screening for HIV, unless you choose not to have this test. HOME CARE INSTRUCTIONS   Avoid all smoking, herbs, alcohol, and unprescribed drugs. These chemicals affect the formation and growth of the baby.  Do not use any tobacco products, including  cigarettes, chewing tobacco, and electronic cigarettes. If you need help quitting, ask your health care provider. You may receive counseling support and other resources to help you quit.  Follow your health care provider's instructions regarding medicine use. There are medicines that are either safe or unsafe to take during pregnancy.  Exercise only as directed by your health care provider. Experiencing uterine cramps is a good sign to stop exercising.  Continue to eat regular, healthy meals.  Wear a good support bra for breast tenderness.  Do not use hot tubs, steam rooms, or saunas.  Wear your seat belt at all times when driving.  Avoid raw meat, uncooked cheese, cat litter boxes, and soil used by cats. These carry germs that can cause birth defects in the baby.  Take your prenatal vitamins.  Take 1500-2000 mg of calcium daily starting at the 20th week of pregnancy until you deliver your baby.  Try taking a stool softener (if your health care provider approves) if you develop constipation. Eat more high-fiber foods, such as fresh vegetables or fruit and whole grains. Drink plenty of fluids to keep your urine clear or pale yellow.  Take warm sitz baths to soothe any pain or discomfort caused by   hemorrhoids. Use hemorrhoid cream if your health care provider approves.  If you develop varicose veins, wear support hose. Elevate your feet for 15 minutes, 3-4 times a day. Limit salt in your diet.  Avoid heavy lifting, wear low heel shoes, and practice good posture.  Rest with your legs elevated if you have leg cramps or low back pain.  Visit your dentist if you have not gone yet during your pregnancy. Use a soft toothbrush to brush your teeth and be gentle when you floss.  A sexual relationship may be continued unless your health care provider directs you otherwise.  Continue to go to all your prenatal visits as directed by your health care provider. SEEK MEDICAL CARE IF:   You have  dizziness.  You have mild pelvic cramps, pelvic pressure, or nagging pain in the abdominal area.  You have persistent nausea, vomiting, or diarrhea.  You have a bad smelling vaginal discharge.  You have pain with urination. SEEK IMMEDIATE MEDICAL CARE IF:   You have a fever.  You are leaking fluid from your vagina.  You have spotting or bleeding from your vagina.  You have severe abdominal cramping or pain.  You have rapid weight gain or loss.  You have shortness of breath with chest pain.  You notice sudden or extreme swelling of your face, hands, ankles, feet, or legs.  You have not felt your baby move in over an hour.  You have severe headaches that do not go away with medicine.  You have vision changes.   This information is not intended to replace advice given to you by your health care provider. Make sure you discuss any questions you have with your health care provider.   Document Released: 06/24/2001 Document Revised: 07/21/2014 Document Reviewed: 08/31/2012 Elsevier Interactive Patient Education 2016 Elsevier Inc.  
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
## 2016-02-05 LAB — POCT URINALYSIS DIP (DEVICE)
Bilirubin Urine: NEGATIVE
GLUCOSE, UA: NEGATIVE mg/dL
Hgb urine dipstick: NEGATIVE
Ketones, ur: NEGATIVE mg/dL
LEUKOCYTES UA: NEGATIVE
NITRITE: NEGATIVE
PROTEIN: NEGATIVE mg/dL
Specific Gravity, Urine: 1.025 (ref 1.005–1.030)
UROBILINOGEN UA: 0.2 mg/dL (ref 0.0–1.0)
pH: 6 (ref 5.0–8.0)

## 2016-02-05 LAB — PRENATAL PROFILE (SOLSTAS)
ANTIBODY SCREEN: NEGATIVE
BASOS ABS: 0 {cells}/uL (ref 0–200)
Basophils Relative: 0 %
EOS ABS: 456 {cells}/uL (ref 15–500)
Eosinophils Relative: 3 %
HCT: 39.2 % (ref 35.0–45.0)
HEMOGLOBIN: 13.7 g/dL (ref 11.7–15.5)
HEP B S AG: NEGATIVE
HIV 1&2 Ab, 4th Generation: NONREACTIVE
LYMPHS ABS: 2736 {cells}/uL (ref 850–3900)
LYMPHS PCT: 18 %
MCH: 29.7 pg (ref 27.0–33.0)
MCHC: 34.9 g/dL (ref 32.0–36.0)
MCV: 84.8 fL (ref 80.0–100.0)
MONO ABS: 456 {cells}/uL (ref 200–950)
MPV: 12.1 fL (ref 7.5–12.5)
Monocytes Relative: 3 %
NEUTROS PCT: 76 %
Neutro Abs: 11552 cells/uL — ABNORMAL HIGH (ref 1500–7800)
Platelets: 144 10*3/uL (ref 140–400)
RBC: 4.62 MIL/uL (ref 3.80–5.10)
RDW: 12.9 % (ref 11.0–15.0)
Rh Type: NEGATIVE
Rubella: 1.73 Index — ABNORMAL HIGH (ref <0.90)
WBC: 15.2 10*3/uL — ABNORMAL HIGH (ref 3.8–10.8)

## 2016-02-05 LAB — AFP, QUAD SCREEN
AFP: 37.9 ng/mL
Age Alone: 1:559 {titer}
CURR GEST AGE: 19 wk
HCG TOTAL: 34.59 [IU]/mL
INH: 553.7 pg/mL
Interpretation-AFP: POSITIVE — AB
MOM FOR AFP: 1.02
MOM FOR HCG: 1.87
MOM FOR INH: 4.49
Open Spina bifida: NEGATIVE
Osb Risk: 1:12300 {titer}
Tri 18 Scr Risk Est: NEGATIVE
uE3 Mom: 1.21
uE3 Value: 1.67 ng/mL

## 2016-02-05 LAB — CYTOLOGY - PAP

## 2016-02-05 LAB — GC/CHLAMYDIA PROBE AMP (~~LOC~~) NOT AT ARMC
CHLAMYDIA, DNA PROBE: NEGATIVE
NEISSERIA GONORRHEA: NEGATIVE

## 2016-02-06 LAB — CULTURE, OB URINE

## 2016-02-07 LAB — PAIN MGMT, PROFILE 6 CONF W/O MM, U
6 Acetylmorphine: NEGATIVE ng/mL (ref <10)
ALCOHOL METABOLITES: NEGATIVE ng/mL (ref <500)
Amphetamines: NEGATIVE ng/mL (ref <500)
BARBITURATES: NEGATIVE ng/mL (ref <300)
BENZODIAZEPINES: NEGATIVE ng/mL (ref <100)
COCAINE METABOLITE: NEGATIVE ng/mL (ref <150)
CODEINE: NEGATIVE ng/mL (ref <50)
Creatinine: 116.2 mg/dL (ref >=20.0)
ETHYL GLUCURONIDE (ETG): NEGATIVE ng/mL (ref <500)
Ethyl Sulfate (ETS): NEGATIVE ng/mL (ref <100)
HYDROCODONE: NEGATIVE ng/mL (ref <50)
Hydromorphone: NEGATIVE ng/mL (ref <50)
MARIJUANA METABOLITE: NEGATIVE ng/mL (ref <20)
METHADONE METABOLITE: NEGATIVE ng/mL (ref <100)
Morphine: NEGATIVE ng/mL (ref <50)
NORHYDROCODONE: NEGATIVE ng/mL (ref <50)
Noroxycodone: 3162 ng/mL — ABNORMAL HIGH (ref <50)
OPIATES: NEGATIVE ng/mL (ref <100)
OXYCODONE: 2747 ng/mL — AB (ref <50)
OXYMORPHONE: 2382 ng/mL — AB (ref <50)
Oxidant: NEGATIVE ug/mL (ref <200)
Oxycodone: POSITIVE ng/mL — AB (ref <100)
PH: 6.5 (ref 4.5–9.0)
PHENCYCLIDINE: NEGATIVE ng/mL (ref <25)
Please note:: 0

## 2016-02-11 ENCOUNTER — Other Ambulatory Visit: Payer: Self-pay | Admitting: *Deleted

## 2016-02-11 DIAGNOSIS — O10912 Unspecified pre-existing hypertension complicating pregnancy, second trimester: Secondary | ICD-10-CM

## 2016-02-11 DIAGNOSIS — O280 Abnormal hematological finding on antenatal screening of mother: Secondary | ICD-10-CM

## 2016-02-11 NOTE — Progress Notes (Signed)
7/31  1200   Pt notified of [place] appt on 8/3 @ 1545. She agreed and voiced understanding.

## 2016-02-12 LAB — PROTEIN / CREATININE RATIO, URINE
Creatinine, Urine: 153 mg/dL (ref 20–320)
Protein Creatinine Ratio: 111 mg/g creat (ref 21–161)
TOTAL PROTEIN, URINE: 17 mg/dL (ref 5–24)

## 2016-02-14 ENCOUNTER — Encounter (HOSPITAL_COMMUNITY): Payer: Self-pay

## 2016-02-14 ENCOUNTER — Ambulatory Visit (HOSPITAL_COMMUNITY)
Admission: RE | Admit: 2016-02-14 | Discharge: 2016-02-14 | Disposition: A | Discharge status: Home / Self Care | Payer: Medicaid Other | Source: Ambulatory Visit | Attending: Advanced Practice Midwife | Admitting: Advanced Practice Midwife

## 2016-02-14 ENCOUNTER — Other Ambulatory Visit: Payer: Self-pay | Admitting: Advanced Practice Midwife

## 2016-02-14 DIAGNOSIS — O283 Abnormal ultrasonic finding on antenatal screening of mother: Secondary | ICD-10-CM | POA: Insufficient documentation

## 2016-02-14 DIAGNOSIS — Z3A20 20 weeks gestation of pregnancy: Secondary | ICD-10-CM

## 2016-02-14 DIAGNOSIS — Z3689 Encounter for other specified antenatal screening: Secondary | ICD-10-CM

## 2016-02-14 DIAGNOSIS — Z3687 Encounter for antenatal screening for uncertain dates: Secondary | ICD-10-CM

## 2016-02-14 DIAGNOSIS — O10012 Pre-existing essential hypertension complicating pregnancy, second trimester: Secondary | ICD-10-CM | POA: Diagnosis not present

## 2016-02-14 DIAGNOSIS — O10912 Unspecified pre-existing hypertension complicating pregnancy, second trimester: Secondary | ICD-10-CM

## 2016-02-14 DIAGNOSIS — O360120 Maternal care for anti-D [Rh] antibodies, second trimester, not applicable or unspecified: Secondary | ICD-10-CM

## 2016-02-14 DIAGNOSIS — O09892 Supervision of other high risk pregnancies, second trimester: Secondary | ICD-10-CM

## 2016-02-14 DIAGNOSIS — O280 Abnormal hematological finding on antenatal screening of mother: Secondary | ICD-10-CM

## 2016-02-14 DIAGNOSIS — O34219 Maternal care for unspecified type scar from previous cesarean delivery: Secondary | ICD-10-CM

## 2016-02-15 ENCOUNTER — Other Ambulatory Visit (HOSPITAL_COMMUNITY): Payer: Self-pay | Admitting: *Deleted

## 2016-02-15 DIAGNOSIS — O280 Abnormal hematological finding on antenatal screening of mother: Secondary | ICD-10-CM

## 2016-02-18 ENCOUNTER — Encounter: Payer: Self-pay | Admitting: Advanced Practice Midwife

## 2016-02-18 DIAGNOSIS — O280 Abnormal hematological finding on antenatal screening of mother: Secondary | ICD-10-CM | POA: Insufficient documentation

## 2016-02-21 ENCOUNTER — Telehealth (HOSPITAL_COMMUNITY): Payer: Self-pay | Admitting: MS"

## 2016-02-21 NOTE — Telephone Encounter (Signed)
Called [name] to discuss her prenatal cell free DNA test results.  Mrs. [name] had Panorama testing through [facility].  Testing was offered because of increased Down syndrome risk on Quad screen.   The patient was identified by name and DOB.  We reviewed that these are within normal limits, showing a less than 1 in 10,000 risk for trisomies 21, 18 and 13, and monosomy X (Turner syndrome).  In addition, the risk for triploidy and sex chromosome trisomies (47,XXX and 47,XXY) was also low risk.  We reviewed that this testing identifies > 99% of pregnancies with trisomy 4321, trisomy 513, sex chromosome trisomies (47,XXX and 47,XXY), and triploidy. The detection rate for trisomy 18 is 96%.  The detection rate for monosomy X is ~92%.  The false positive rate is <0.1% for all conditions. Testing was also consistent with female fetal sex.  The patient did wish to know fetal sex.  She understands that this testing does not identify all genetic conditions. Ms. [name] stated she was not interested in further testing for Down syndrome via amniocentesis, and she declined her upcoming genetic counseling visit on 02/28/16. All questions were answered to her satisfaction, she was encouraged to call with additional questions or concerns.  Leslie PlowmanKaren Ryun Velez, MS Patent attorneyCertified Genetic Counselor

## 2016-02-22 ENCOUNTER — Other Ambulatory Visit (HOSPITAL_COMMUNITY): Payer: Self-pay

## 2016-02-28 ENCOUNTER — Ambulatory Visit (HOSPITAL_COMMUNITY): Payer: Medicaid Other

## 2016-03-05 ENCOUNTER — Encounter: Payer: Self-pay | Admitting: Medical

## 2016-03-05 ENCOUNTER — Ambulatory Visit (INDEPENDENT_AMBULATORY_CARE_PROVIDER_SITE_OTHER): Payer: Medicaid Other | Admitting: Medical

## 2016-03-05 VITALS — BP 130/88 | HR 92 | Wt 244.8 lb

## 2016-03-05 DIAGNOSIS — I10 Essential (primary) hypertension: Secondary | ICD-10-CM

## 2016-03-05 DIAGNOSIS — O09892 Supervision of other high risk pregnancies, second trimester: Secondary | ICD-10-CM

## 2016-03-05 DIAGNOSIS — O10912 Unspecified pre-existing hypertension complicating pregnancy, second trimester: Secondary | ICD-10-CM

## 2016-03-05 LAB — POCT URINALYSIS DIP (DEVICE)
Bilirubin Urine: NEGATIVE
Glucose, UA: NEGATIVE mg/dL
Hgb urine dipstick: NEGATIVE
KETONES UR: NEGATIVE mg/dL
Leukocytes, UA: NEGATIVE
Nitrite: NEGATIVE
PH: 6 (ref 5.0–8.0)
PROTEIN: NEGATIVE mg/dL
SPECIFIC GRAVITY, URINE: 1.025 (ref 1.005–1.030)
UROBILINOGEN UA: 0.2 mg/dL (ref 0.0–1.0)

## 2016-03-05 MED ORDER — AMOXICILLIN 500 MG PO CAPS: 500.0000 mg | ORAL_CAPSULE | Freq: Two times a day (BID) | ORAL | 0 refills | Status: DC

## 2016-03-05 NOTE — Patient Instructions (Signed)
Second Trimester of Pregnancy  The second trimester is from week 13 through week 28, month 4 through 6. This is often the time in pregnancy that you feel your best. Often times, morning sickness has lessened or quit. You may have more energy, and you may get hungry more often. Your unborn baby (fetus) is growing rapidly. At the end of the sixth month, he or she is about 9 inches long and weighs about 1½ pounds. You will likely feel the baby move (quickening) between 18 and 20 weeks of pregnancy.  HOME CARE   · Avoid all smoking, herbs, and alcohol. Avoid drugs not approved by your doctor.  · Do not use any tobacco products, including cigarettes, chewing tobacco, and electronic cigarettes. If you need help quitting, ask your doctor. You may get counseling or other support to help you quit.  · Only take medicine as told by your doctor. Some medicines are safe and some are not during pregnancy.  · Exercise only as told by your doctor. Stop exercising if you start having cramps.  · Eat regular, healthy meals.  · Wear a good support bra if your breasts are tender.  · Do not use hot tubs, steam rooms, or saunas.  · Wear your seat belt when driving.  · Avoid raw meat, uncooked cheese, and liter boxes and soil used by cats.  · Take your prenatal vitamins.  · Take 1500-2000 milligrams of calcium daily starting at the 20th week of pregnancy until you deliver your baby.  · Try taking medicine that helps you poop (stool softener) as needed, and if your doctor approves. Eat more fiber by eating fresh fruit, vegetables, and whole grains. Drink enough fluids to keep your pee (urine) clear or pale yellow.  · Take warm water baths (sitz baths) to soothe pain or discomfort caused by hemorrhoids. Use hemorrhoid cream if your doctor approves.  · If you have puffy, bulging veins (varicose veins), wear support hose. Raise (elevate) your feet for 15 minutes, 3-4 times a day. Limit salt in your diet.  · Avoid heavy lifting, wear low heals,  and sit up straight.  · Rest with your legs raised if you have leg cramps or low back pain.  · Visit your dentist if you have not gone during your pregnancy. Use a soft toothbrush to brush your teeth. Be gentle when you floss.  · You can have sex (intercourse) unless your doctor tells you not to.  · Go to your doctor visits.  GET HELP IF:   · You feel dizzy.  · You have mild cramps or pressure in your lower belly (abdomen).  · You have a nagging pain in your belly area.  · You continue to feel sick to your stomach (nauseous), throw up (vomit), or have watery poop (diarrhea).  · You have bad smelling fluid coming from your vagina.  · You have pain with peeing (urination).  GET HELP RIGHT AWAY IF:   · You have a fever.  · You are leaking fluid from your vagina.  · You have spotting or bleeding from your vagina.  · You have severe belly cramping or pain.  · You lose or gain weight rapidly.  · You have trouble catching your breath and have chest pain.  · You notice sudden or extreme puffiness (swelling) of your face, hands, ankles, feet, or legs.  · You have not felt the baby move in over an hour.  · You have severe headaches that do   not go away with medicine.  · You have vision changes.     This information is not intended to replace advice given to you by your health care provider. Make sure you discuss any questions you have with your health care provider.     Document Released: 09/24/2009 Document Revised: 07/21/2014 Document Reviewed: 08/31/2012  Elsevier Interactive Patient Education ©2016 Elsevier Inc.

## 2016-03-05 NOTE — Progress Notes (Signed)
Patient is not taking blood pressure medication.
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
## 2016-03-06 ENCOUNTER — Telehealth: Payer: Self-pay | Admitting: *Deleted

## 2016-03-06 MED ORDER — OXYCODONE-ACETAMINOPHEN 10-325 MG PO TABS: 1.0000 | ORAL_TABLET | Freq: Four times a day (QID) | ORAL | 0 refills | Status: DC | PRN
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
## 2016-03-13 ENCOUNTER — Other Ambulatory Visit (HOSPITAL_COMMUNITY): Payer: Self-pay | Admitting: Maternal and Fetal Medicine

## 2016-03-13 ENCOUNTER — Encounter (HOSPITAL_COMMUNITY): Payer: Self-pay

## 2016-03-13 ENCOUNTER — Ambulatory Visit (HOSPITAL_COMMUNITY)
Admission: RE | Admit: 2016-03-13 | Discharge: 2016-03-13 | Disposition: A | Discharge status: Home / Self Care | Payer: Medicaid Other | Source: Ambulatory Visit | Attending: Advanced Practice Midwife | Admitting: Advanced Practice Midwife

## 2016-03-13 VITALS — BP 127/78 | HR 94 | Wt 246.2 lb

## 2016-03-13 DIAGNOSIS — O280 Abnormal hematological finding on antenatal screening of mother: Secondary | ICD-10-CM

## 2016-03-13 DIAGNOSIS — E669 Obesity, unspecified: Secondary | ICD-10-CM | POA: Insufficient documentation

## 2016-03-13 DIAGNOSIS — O360190 Maternal care for anti-D [Rh] antibodies, unspecified trimester, not applicable or unspecified: Secondary | ICD-10-CM

## 2016-03-13 DIAGNOSIS — O99212 Obesity complicating pregnancy, second trimester: Secondary | ICD-10-CM

## 2016-03-13 DIAGNOSIS — O10919 Unspecified pre-existing hypertension complicating pregnancy, unspecified trimester: Secondary | ICD-10-CM

## 2016-03-13 DIAGNOSIS — IMO0002 Reserved for concepts with insufficient information to code with codable children: Secondary | ICD-10-CM

## 2016-03-13 DIAGNOSIS — Z0489 Encounter for examination and observation for other specified reasons: Secondary | ICD-10-CM

## 2016-03-13 DIAGNOSIS — Z3A24 24 weeks gestation of pregnancy: Secondary | ICD-10-CM

## 2016-03-13 DIAGNOSIS — O34219 Maternal care for unspecified type scar from previous cesarean delivery: Secondary | ICD-10-CM

## 2016-03-13 DIAGNOSIS — O283 Abnormal ultrasonic finding on antenatal screening of mother: Secondary | ICD-10-CM | POA: Insufficient documentation

## 2016-03-20 ENCOUNTER — Telehealth: Payer: Self-pay | Admitting: *Deleted

## 2016-03-20 NOTE — Telephone Encounter (Signed)
Pt left message requesting refill of pain medicine. She has enough to last until Saturday and wants refill prior to the weekend.

## 2016-03-21 NOTE — Telephone Encounter (Signed)
Patient called and left message on nurse line stating she is running out of percocet tomorrow and needs refill. Per chart review, patient signed pain contract on 8/25. Discussed patient with Dr Macon LargeAnyanwu, who states patient's previous Rx of Oxycodone 10-325 disp 120 lasted until August, which is 30 pills a month. Patient was given #30 percocet on 8/24 which should last until 9/24. Called patient and discussed with her. Patient states the prescription says to take every 6 hours. Discussed with patient it says as needed and the prescription was supposed to last her month like her previous prescription. Told patient we will not refill again until 9/25 as 9/24 falls on a Sunday. Patient verbalized understanding & hung up

## 2016-03-21 NOTE — Telephone Encounter (Signed)
Patient calling to see if her pain medication can be refilled as she will run out on Saturday.

## 2016-03-22 ENCOUNTER — Encounter: Payer: Self-pay | Admitting: *Deleted

## 2016-03-27 ENCOUNTER — Ambulatory Visit (INDEPENDENT_AMBULATORY_CARE_PROVIDER_SITE_OTHER): Payer: Medicaid Other | Admitting: Advanced Practice Midwife

## 2016-03-27 ENCOUNTER — Ambulatory Visit (INDEPENDENT_AMBULATORY_CARE_PROVIDER_SITE_OTHER): Payer: Medicaid Other | Admitting: Clinical

## 2016-03-27 VITALS — BP 135/65 | HR 98 | Wt 244.2 lb

## 2016-03-27 DIAGNOSIS — G4700 Insomnia, unspecified: Secondary | ICD-10-CM

## 2016-03-27 DIAGNOSIS — O09892 Supervision of other high risk pregnancies, second trimester: Secondary | ICD-10-CM

## 2016-03-27 DIAGNOSIS — F411 Generalized anxiety disorder: Secondary | ICD-10-CM

## 2016-03-27 DIAGNOSIS — Z23 Encounter for immunization: Secondary | ICD-10-CM | POA: Diagnosis not present

## 2016-03-27 DIAGNOSIS — F6089 Other specific personality disorders: Secondary | ICD-10-CM

## 2016-03-27 DIAGNOSIS — G8929 Other chronic pain: Secondary | ICD-10-CM

## 2016-03-27 DIAGNOSIS — O9989 Other specified diseases and conditions complicating pregnancy, childbirth and the puerperium: Secondary | ICD-10-CM

## 2016-03-27 DIAGNOSIS — R4689 Other symptoms and signs involving appearance and behavior: Secondary | ICD-10-CM

## 2016-03-27 DIAGNOSIS — M549 Dorsalgia, unspecified: Secondary | ICD-10-CM

## 2016-03-27 LAB — POCT URINALYSIS DIP (DEVICE)
BILIRUBIN URINE: NEGATIVE
GLUCOSE, UA: NEGATIVE mg/dL
Hgb urine dipstick: NEGATIVE
KETONES UR: NEGATIVE mg/dL
Leukocytes, UA: NEGATIVE
Nitrite: NEGATIVE
PROTEIN: NEGATIVE mg/dL
Specific Gravity, Urine: 1.015 (ref 1.005–1.030)
Urobilinogen, UA: 0.2 mg/dL (ref 0.0–1.0)
pH: 6.5 (ref 5.0–8.0)

## 2016-03-27 MED ORDER — OXYCODONE-ACETAMINOPHEN 10-325 MG PO TABS: 1.0000 | ORAL_TABLET | Freq: Four times a day (QID) | ORAL | 0 refills | Status: DC | PRN

## 2016-03-27 NOTE — Patient Instructions (Signed)
Insomnia Insomnia is a sleep disorder that makes it difficult to fall asleep or to stay asleep. Insomnia can cause tiredness (fatigue), low energy, difficulty concentrating, mood swings, and poor performance at work or school.  There are three different ways to classify insomnia:  Difficulty falling asleep.  Difficulty staying asleep.  Waking up too early in the morning. Any type of insomnia can be long-term (chronic) or short-term (acute). Both are common. Short-term insomnia usually lasts for three months or less. Chronic insomnia occurs at least three times a week for longer than three months. CAUSES  Insomnia may be caused by another condition, situation, or substance, such as:  Anxiety.  Certain medicines.  Gastroesophageal reflux disease (GERD) or other gastrointestinal conditions.  Asthma or other breathing conditions.  Restless legs syndrome, sleep apnea, or other sleep disorders.  Chronic pain.  Menopause. This may include hot flashes.  Stroke.  Abuse of alcohol, tobacco, or illegal drugs.  Depression.  Caffeine.   Neurological disorders, such as Alzheimer disease.  An overactive thyroid (hyperthyroidism). The cause of insomnia may not be known. RISK FACTORS Risk factors for insomnia include:  Gender. Women are more commonly affected than men.  Age. Insomnia is more common as you get older.  Stress. This may involve your professional or personal life.  Income. Insomnia is more common in people with lower income.  Lack of exercise.   Irregular work schedule or night shifts.  Traveling between different time zones. SIGNS AND SYMPTOMS If you have insomnia, trouble falling asleep or trouble staying asleep is the main symptom. This may lead to other symptoms, such as:  Feeling fatigued.  Feeling nervous about going to sleep.  Not feeling rested in the morning.  Having trouble concentrating.  Feeling irritable, anxious, or depressed. TREATMENT   Treatment for insomnia depends on the cause. If your insomnia is caused by an underlying condition, treatment will focus on addressing the condition. Treatment may also include:   Medicines to help you sleep.  Counseling or therapy.  Lifestyle adjustments. HOME CARE INSTRUCTIONS   Take medicines only as directed by your health care provider.  Keep regular sleeping and waking hours. Avoid naps.  Keep a sleep diary to help you and your health care provider figure out what could be causing your insomnia. Include:   When you sleep.  When you wake up during the night.  How well you sleep.   How rested you feel the next day.  Any side effects of medicines you are taking.  What you eat and drink.   Make your bedroom a comfortable place where it is easy to fall asleep:  Put up shades or special blackout curtains to block light from outside.  Use a white noise machine to block noise.  Keep the temperature cool.   Exercise regularly as directed by your health care provider. Avoid exercising right before bedtime.  Use relaxation techniques to manage stress. Ask your health care provider to suggest some techniques that may work well for you. These may include:  Breathing exercises.  Routines to release muscle tension.  Visualizing peaceful scenes.  Cut back on alcohol, caffeinated beverages, and cigarettes, especially close to bedtime. These can disrupt your sleep.  Do not overeat or eat spicy foods right before bedtime. This can lead to digestive discomfort that can make it hard for you to sleep.  Limit screen use before bedtime. This includes:  Watching TV.  Using your smartphone, tablet, and computer.  Stick to a routine. This   can help you fall asleep faster. Try to do a quiet activity, brush your teeth, and go to bed at the same time each night.  Get out of bed if you are still awake after 15 minutes of trying to sleep. Keep the lights down, but try reading or  doing a quiet activity. When you feel sleepy, go back to bed.  Make sure that you drive carefully. Avoid driving if you feel very sleepy.  Keep all follow-up appointments as directed by your health care provider. This is important. SEEK MEDICAL CARE IF:   You are tired throughout the day or have trouble in your daily routine due to sleepiness.  You continue to have sleep problems or your sleep problems get worse. SEEK IMMEDIATE MEDICAL CARE IF:   You have serious thoughts about hurting yourself or someone else.   This information is not intended to replace advice given to you by your health care provider. Make sure you discuss any questions you have with your health care provider.   Document Released: 06/27/2000 Document Revised: 03/21/2015 Document Reviewed: 03/31/2014 Elsevier Interactive Patient Education 2016 Elsevier Inc.  

## 2016-03-27 NOTE — BH Specialist Note (Signed)
  ASSESSMENT: Pt currently experiencing Generalized anxiety disorder. [facility] Pt would benefit from psychoeducation and brief therapeutic interventions regarding coping with symptoms of anxiety and panic attacks. Pt may benefit from community resources.  Stage of Change: precontemplative to contemplative  PLAN: 1. F/U with behavioral health clinician in two weeks  2. Psychiatric Medications: none 3. Behavioral recommendations:   -Consider Worry Hour strategy for prioritizing life stressors and worries -Consider MotherToBabyNC for additional questions about anxiety meds in pregnancy and postpartum -Read educational material regarding coping with symptoms of anxiety with panic attacks, as well as coping with chronic pain and insomnia -Consider community resources as needed, discussed in office visit  SUBJECTIVE: Pt. referred by Dorathy KinsmanVirginia Smith, CNM, for symptoms of anxiety. Pt. reports the following symptoms/concerns: Pt states that she is continuously on edge, easily irritated, concerned about increase in aggressive sex drive, constant worry, and lack of sleep attributed to noise from rest of household. Pt stressed about living in disabled father's home and unstable finances, along with chronic pain and panic attacks; does not want to take medication for anxiety at this time Duration of problem: Undetermined number of years Severity: severe   OBJECTIVE: Orientation & Cognition: Oriented x3. Thought processes normal and appropriate to situation. Mood: anxious Affect: appropriate Appearance: appropriate Risk of harm to self or others: no known risk of harm to self or others Substance use: none Assessments administered: PHQ9: 9/ GAD7: 21  Diagnosis: Generalized anxiety disorder CPT Code: F41.1  -------------------------------------------- Other(s) present in the room: FOB  Time spent with patient in exam room:60 minutes, 9:10-10:10am  Dep[facility] screen [facility] 2/[facility]  03/27/2016 03/05/2016  Decreased Interest 1 2  Down, Depressed, Hopeless 0 3  PHQ - 2 Score 1 5  Altered sleeping 3 3  Tired, decreased energy 2 3  Change in appetite 1 0  Feeling bad or failure about yourself  0 0  Trouble concentrating 0 0  Moving slowly or fidgety/restless 2 0  Suicidal thoughts 0 0  PHQ-9 Score 9 11   GAD 7 : Generalized Anxiety Score 03/27/2016 03/05/2016  Nervous, Anxious, on Edge 3 2  Control/stop worrying 3 3  Worry too much - different things 3 3  Trouble relaxing 3 3  Restless 3 1  Easily annoyed or irritable 3 1  Afraid - awful might happen 3 3  Total GAD 7 Score 21 16
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
## 2016-03-27 NOTE — Progress Notes (Signed)
Flu vaccine today 

## 2016-04-04 ENCOUNTER — Inpatient Hospital Stay (HOSPITAL_COMMUNITY): Payer: Medicaid Other

## 2016-04-04 ENCOUNTER — Encounter (HOSPITAL_COMMUNITY): Payer: Self-pay | Admitting: *Deleted

## 2016-04-04 ENCOUNTER — Inpatient Hospital Stay (HOSPITAL_COMMUNITY)
Admission: AD | Admit: 2016-04-04 | Discharge: 2016-04-04 | Disposition: A | Discharge status: Home / Self Care | Payer: Medicaid Other | Source: Ambulatory Visit | Attending: Obstetrics and Gynecology | Admitting: Obstetrics and Gynecology

## 2016-04-04 DIAGNOSIS — Z79899 Other long term (current) drug therapy: Secondary | ICD-10-CM | POA: Insufficient documentation

## 2016-04-04 DIAGNOSIS — J45990 Exercise induced bronchospasm: Secondary | ICD-10-CM | POA: Insufficient documentation

## 2016-04-04 DIAGNOSIS — J209 Acute bronchitis, unspecified: Secondary | ICD-10-CM | POA: Insufficient documentation

## 2016-04-04 DIAGNOSIS — Z3A27 27 weeks gestation of pregnancy: Secondary | ICD-10-CM | POA: Insufficient documentation

## 2016-04-04 DIAGNOSIS — O99212 Obesity complicating pregnancy, second trimester: Secondary | ICD-10-CM | POA: Diagnosis not present

## 2016-04-04 DIAGNOSIS — O99512 Diseases of the respiratory system complicating pregnancy, second trimester: Secondary | ICD-10-CM | POA: Diagnosis not present

## 2016-04-04 DIAGNOSIS — O99332 Smoking (tobacco) complicating pregnancy, second trimester: Secondary | ICD-10-CM | POA: Diagnosis not present

## 2016-04-04 DIAGNOSIS — Z7982 Long term (current) use of aspirin: Secondary | ICD-10-CM | POA: Insufficient documentation

## 2016-04-04 DIAGNOSIS — F17210 Nicotine dependence, cigarettes, uncomplicated: Secondary | ICD-10-CM | POA: Insufficient documentation

## 2016-04-04 DIAGNOSIS — R109 Unspecified abdominal pain: Secondary | ICD-10-CM | POA: Diagnosis present

## 2016-04-04 DIAGNOSIS — J45901 Unspecified asthma with (acute) exacerbation: Secondary | ICD-10-CM | POA: Diagnosis not present

## 2016-04-04 DIAGNOSIS — J42 Unspecified chronic bronchitis: Secondary | ICD-10-CM | POA: Diagnosis not present

## 2016-04-04 DIAGNOSIS — O99352 Diseases of the nervous system complicating pregnancy, second trimester: Secondary | ICD-10-CM | POA: Diagnosis not present

## 2016-04-04 DIAGNOSIS — J440 Chronic obstructive pulmonary disease with acute lower respiratory infection: Secondary | ICD-10-CM

## 2016-04-04 DIAGNOSIS — O10912 Unspecified pre-existing hypertension complicating pregnancy, second trimester: Secondary | ICD-10-CM | POA: Diagnosis not present

## 2016-04-04 DIAGNOSIS — O26892 Other specified pregnancy related conditions, second trimester: Secondary | ICD-10-CM | POA: Diagnosis not present

## 2016-04-04 DIAGNOSIS — R0602 Shortness of breath: Secondary | ICD-10-CM

## 2016-04-04 DIAGNOSIS — O99513 Diseases of the respiratory system complicating pregnancy, third trimester: Secondary | ICD-10-CM

## 2016-04-04 DIAGNOSIS — O10913 Unspecified pre-existing hypertension complicating pregnancy, third trimester: Secondary | ICD-10-CM

## 2016-04-04 LAB — URINALYSIS, ROUTINE W REFLEX MICROSCOPIC
BILIRUBIN URINE: NEGATIVE
Glucose, UA: NEGATIVE mg/dL
Hgb urine dipstick: NEGATIVE
KETONES UR: NEGATIVE mg/dL
LEUKOCYTES UA: NEGATIVE
NITRITE: NEGATIVE
PH: 6.5 (ref 5.0–8.0)
PROTEIN: 30 mg/dL — AB
Specific Gravity, Urine: 1.02 (ref 1.005–1.030)

## 2016-04-04 LAB — BASIC METABOLIC PANEL
Anion gap: 7 (ref 5–15)
BUN: 6 mg/dL (ref 6–20)
CHLORIDE: 107 mmol/L (ref 101–111)
CO2: 22 mmol/L (ref 22–32)
CREATININE: 0.51 mg/dL (ref 0.44–1.00)
Calcium: 8.6 mg/dL — ABNORMAL LOW (ref 8.9–10.3)
GFR calc Af Amer: >60 mL/min (ref >60)
GLUCOSE: 94 mg/dL (ref 65–99)
POTASSIUM: 3.8 mmol/L (ref 3.5–5.1)
Sodium: 136 mmol/L (ref 135–145)

## 2016-04-04 LAB — RAPID URINE DRUG SCREEN, HOSP PERFORMED
AMPHETAMINES: NOT DETECTED
Barbiturates: NOT DETECTED
Benzodiazepines: NOT DETECTED
COCAINE: NOT DETECTED
OPIATES: POSITIVE — AB
TETRAHYDROCANNABINOL: NOT DETECTED

## 2016-04-04 LAB — CBC WITH DIFFERENTIAL/PLATELET
Basophils Absolute: 0 10*3/uL (ref 0.0–0.1)
Basophils Relative: 0 %
EOS PCT: 2 %
Eosinophils Absolute: 0.2 10*3/uL (ref 0.0–0.7)
HCT: 35.5 % — ABNORMAL LOW (ref 36.0–46.0)
Hemoglobin: 12.5 g/dL (ref 12.0–15.0)
LYMPHS ABS: 2.5 10*3/uL (ref 0.7–4.0)
LYMPHS PCT: 19 %
MCH: 29.5 pg (ref 26.0–34.0)
MCHC: 35.2 g/dL (ref 30.0–36.0)
MCV: 83.7 fL (ref 78.0–100.0)
MONOS PCT: 4 %
Monocytes Absolute: 0.6 10*3/uL (ref 0.1–1.0)
Neutro Abs: 10.2 10*3/uL — ABNORMAL HIGH (ref 1.7–7.7)
Neutrophils Relative %: 75 %
PLATELETS: 145 10*3/uL — AB (ref 150–400)
RBC: 4.24 MIL/uL (ref 3.87–5.11)
RDW: 13.8 % (ref 11.5–15.5)
WBC: 13.5 10*3/uL — AB (ref 4.0–10.5)

## 2016-04-04 LAB — URINE MICROSCOPIC-ADD ON: RBC / HPF: NONE SEEN RBC/hpf (ref 0–5)

## 2016-04-04 LAB — INFLUENZA PANEL BY PCR (TYPE A & B)
H1N1 flu by pcr: NOT DETECTED
Influenza A By PCR: NEGATIVE
Influenza B By PCR: NEGATIVE

## 2016-04-04 LAB — PROTEIN / CREATININE RATIO, URINE
Creatinine, Urine: 194 mg/dL
Protein Creatinine Ratio: 0.22 mg/mg{Cre} — ABNORMAL HIGH (ref 0.00–0.15)
Total Protein, Urine: 43 mg/dL

## 2016-04-04 MED ORDER — GUAIFENESIN 200 MG PO TABS: 200.0000 mg | ORAL_TABLET | ORAL | 0 refills | Status: DC | PRN

## 2016-04-04 MED ORDER — ALBUTEROL SULFATE HFA 108 (90 BASE) MCG/ACT IN AERS: 2.0000 | INHALATION_SPRAY | RESPIRATORY_TRACT | 2 refills | Status: DC | PRN

## 2016-04-04 MED ORDER — METHYLPREDNISOLONE SODIUM SUCC 125 MG IJ SOLR
125.0000 mg | Freq: Once | INTRAMUSCULAR | Status: AC
  Administered 2016-04-04: 125 mg via INTRAVENOUS
  Filled 2016-04-04: qty 2

## 2016-04-04 MED ORDER — ALBUTEROL SULFATE (2.5 MG/3ML) 0.083% IN NEBU
2.5000 mg | INHALATION_SOLUTION | Freq: Once | RESPIRATORY_TRACT | Status: AC
  Administered 2016-04-04: 2.5 mg via RESPIRATORY_TRACT
  Filled 2016-04-04: qty 3

## 2016-04-04 MED ORDER — IPRATROPIUM-ALBUTEROL 0.5-2.5 (3) MG/3ML IN SOLN
3.0000 mL | Freq: Four times a day (QID) | RESPIRATORY_TRACT | Status: DC
  Filled 2016-04-04 (×6): qty 3

## 2016-04-04 MED ORDER — ALBUTEROL SULFATE (2.5 MG/3ML) 0.083% IN NEBU: 2.5000 mg | INHALATION_SOLUTION | Freq: Once | RESPIRATORY_TRACT | Status: DC

## 2016-04-04 MED ORDER — IPRATROPIUM-ALBUTEROL 0.5-2.5 (3) MG/3ML IN SOLN
3.0000 mL | RESPIRATORY_TRACT | Status: DC
  Filled 2016-04-04 (×7): qty 3

## 2016-04-04 MED ORDER — PREDNISONE 10 MG (21) PO TBPK: ORAL_TABLET | ORAL | 0 refills | Status: DC

## 2016-04-04 MED ORDER — FLUTICASONE-SALMETEROL 100-50 MCG/DOSE IN AEPB
1.0000 | INHALATION_SPRAY | Freq: Two times a day (BID) | RESPIRATORY_TRACT | 0 refills | Status: DC
Start: 2016-04-04 — End: 2016-04-08

## 2016-04-04 NOTE — MAU Note (Signed)
Pulse ox while ambulating in hallway 95-96% on room air.

## 2016-04-04 NOTE — Discharge Instructions (Signed)
Acute Bronchitis Bronchitis is when the airways that extend from the windpipe into the lungs get red, puffy, and painful (inflamed). Bronchitis often causes thick spit (mucus) to develop. This leads to a cough. A cough is the most common symptom of bronchitis. In acute bronchitis, the condition usually begins suddenly and goes away over time (usually in 2 weeks). Smoking, allergies, and asthma can make bronchitis worse. Repeated episodes of bronchitis may cause more lung problems. HOME CARE  Rest.  Drink enough fluids to keep your pee (urine) clear or pale yellow (unless you need to limit fluids as told by your doctor).  Only take over-the-counter or prescription medicines as told by your doctor.  Avoid smoking and secondhand smoke. These can make bronchitis worse. If you are a smoker, think about using nicotine gum or skin patches. Quitting smoking will help your lungs heal faster.  Reduce the chance of getting bronchitis again by:  Washing your hands often.  Avoiding people with cold symptoms.  Trying not to touch your hands to your mouth, nose, or eyes.  Follow up with your doctor as told. GET HELP IF: Your symptoms do not improve after 1 week of treatment. Symptoms include:  Cough.  Fever.  Coughing up thick spit.  Body aches.  Chest congestion.  Chills.  Shortness of breath.  Sore throat. GET HELP RIGHT AWAY IF:   You have an increased fever.  You have chills.  You have severe shortness of breath.  You have bloody thick spit (sputum).  You throw up (vomit) often.  You lose too much body fluid (dehydration).  You have a severe headache.  You faint. MAKE SURE YOU:   Understand these instructions.  Will watch your condition.  Will get help right away if you are not doing well or get worse.   This information is not intended to replace advice given to you by your health care provider. Make sure you discuss any questions you have with your health  care provider.   Document Released: 12/17/2007 Document Revised: 03/02/2013 Document Reviewed: 12/21/2012 Elsevier Interactive Patient Education 2016 Elsevier Inc. Asthma Attack Prevention While you may not be able to control the fact that you have asthma, you can take actions to prevent asthma attacks. The best way to prevent asthma attacks is to maintain good control of your asthma. You can achieve this by:  Taking your medicines as directed.  Avoiding things that can irritate your airways or make your asthma symptoms worse (asthma triggers).  Keeping track of how well your asthma is controlled and of any changes in your symptoms.  Responding quickly to worsening asthma symptoms (asthma attack).  Seeking emergency care when it is needed. WHAT ARE SOME WAYS TO PREVENT AN ASTHMA ATTACK? Have a Plan Work with your health care provider to create a written plan for managing and treating your asthma attacks (asthma action plan). This plan includes:  A list of your asthma triggers and how you can avoid them.  Information on when medicines should be taken and when their dosages should be changed.  The use of a device that measures how well your lungs are working (peak flow meter). Monitor Your Asthma Use your peak flow meter and record your results in a journal every day. A drop in your peak flow numbers on one or more days may indicate the start of an asthma attack. This can happen even before you start to feel symptoms. You can prevent an asthma attack from getting worse by  following the steps in your asthma action plan. Avoid Asthma Triggers Work with your asthma health care provider to find out what your asthma triggers are. This can be done by:  Allergy testing.  Keeping a journal that notes when asthma attacks occur and the factors that may have contributed to them.  Determining if there are other medical conditions that are making your asthma worse. Once you have determined your  asthma triggers, take steps to avoid them. This may include avoiding excessive or prolonged exposure to:  Dust. Have someone dust and vacuum your home for you once or twice a week. Using a high-efficiency particulate arrestance (HEPA) vacuum is best.  Smoke. This includes campfire smoke, forest fire smoke, and secondhand smoke from tobacco products.  Pet dander. Avoid contact with animals that you know you are allergic to.  Allergens from trees, grasses or pollens. Avoid spending a lot of time outdoors when pollen counts are high, and on very windy days.  Very cold, dry, or humid air.  Mold.  Foods that contain high amounts of sulfites.  Strong odors.  Outdoor air pollutants, such as Museum/gallery exhibitions officerengine exhaust.  Indoor air pollutants, such as aerosol sprays and fumes from household cleaners.  Household pests, including dust mites and cockroaches, and pest droppings.  Certain medicines, including NSAIDs. Always talk to your health care provider before stopping or starting any new medicines. Medicines Take over-the-counter and prescription medicines only as told by your health care provider. Many asthma attacks can be prevented by carefully following your medicine schedule. Taking your medicines correctly is especially important when you cannot avoid certain asthma triggers. Act Quickly If an asthma attack does happen, acting quickly can decrease how severe it is and how long it lasts. Take these steps:   Pay attention to your symptoms. If you are coughing, wheezing, or having difficulty breathing, do not wait to see if your symptoms go away on their own. Follow your asthma action plan.  If you have followed your asthma action plan and your symptoms are not improving, call your health care provider or seek immediate medical care at the nearest hospital. It is important to note how often you need to use your fast-acting rescue inhaler. If you are using your rescue inhaler more often, it may mean  that your asthma is not under control. Adjusting your asthma treatment plan may help you to prevent future asthma attacks and help you to gain better control of your condition. HOW CAN I PREVENT AN ASTHMA ATTACK WHEN I EXERCISE? Follow advice from your health care provider about whether you should use your fast-acting inhaler before exercising. Many people with asthma experience exercise-induced bronchoconstriction (EIB). This condition often worsens during vigorous exercise in cold, humid, or dry environments. Usually, people with EIB can stay very active by pre-treating with a fast-acting inhaler before exercising.   This information is not intended to replace advice given to you by your health care provider. Make sure you discuss any questions you have with your health care provider.   Document Released: 06/18/2009 Document Revised: 03/21/2015 Document Reviewed: 11/30/2014 Elsevier Interactive Patient Education 2016 Elsevier Inc.    Chronic Obstructive Pulmonary Disease Chronic obstructive pulmonary disease (COPD) is a common lung problem. In COPD, the flow of air from the lungs is limited. The way your lungs work will probably never return to normal, but there are things you can do to improve your lungs and make yourself feel better. Your doctor may treat your condition with:  Medicines.  Oxygen.  Lung surgery.  Changes to your diet.  Rehabilitation. This may involve a team of specialists. HOME CARE  Take all medicines as told by your doctor.  Avoid medicines or cough syrups that dry up your airway (such as antihistamines) and do not allow you to get rid of thick spit. You do not need to avoid them if told differently by your doctor.  If you smoke, stop. Smoking makes the problem worse.  Avoid being around things that make your breathing worse (like smoke, chemicals, and fumes).  Use oxygen therapy and therapy to help improve your lungs (pulmonary rehabilitation) if told by your  doctor. If you need home oxygen therapy, ask your doctor if you should buy a tool to measure your oxygen level (oximeter).  Avoid people who have a sickness you can catch (contagious).  Avoid going outside when it is very hot, cold, or humid.  Eat healthy foods. Eat smaller meals more often. Rest before meals.  Stay active, but remember to also rest.  Make sure to get all the shots (vaccines) your doctor recommends. Ask your doctor if you need a pneumonia shot.  Learn and use tips on how to relax.  Learn and use tips on how to control your breathing as told by your doctor. Try:  Breathing in (inhaling) through your nose for 1 second. Then, pucker your lips and breath out (exhale) through your lips for 2 seconds.  Putting one hand on your belly (abdomen). Breathe in slowly through your nose for 1 second. Your hand on your belly should move out. Pucker your lips and breathe out slowly through your lips. Your hand on your belly should move in as you breathe out.  Learn and use controlled coughing to clear thick spit from your lungs. The steps are: 1. Lean your head a little forward. 2. Breathe in deeply. 3. Try to hold your breath for 3 seconds. 4. Keep your mouth slightly open while coughing 2 times. 5. Spit any thick spit out into a tissue. 6. Rest and do the steps again 1 or 2 times as needed. GET HELP IF:  You cough up more thick spit than usual.  There is a change in the color or thickness of the spit.  It is harder to breathe than usual.  Your breathing is faster than usual. GET HELP RIGHT AWAY IF:  You have shortness of breath while resting.  You have shortness of breath that stops you from:  Being able to talk.  Doing normal activities.  You chest hurts for longer than 5 minutes.  Your skin color is more blue than usual.  Your pulse oximeter shows that you have low oxygen for longer than 5 minutes. MAKE SURE YOU:  Understand these instructions.  Will watch  your condition.  Will get help right away if you are not doing well or get worse.   This information is not intended to replace advice given to you by your health care provider. Make sure you discuss any questions you have with your health care provider.   Document Released: 12/17/2007 Document Revised: 07/21/2014 Document Reviewed: 02/24/2013 Elsevier Interactive Patient Education [employer]

## 2016-04-04 NOTE — MAU Note (Signed)
Pain in lower abd, feels like someone is cutting her from the inside out.  Was having 'normal' pains when she coughs.  Hx of hernia on left side.  Has exercise induced asthma.  Doesn't currently have inhaler.  Productive cough.  Feels sob. Has been taking Tylenol- thought she has had fever- but hasn't checked it.

## 2016-04-04 NOTE — MAU Provider Note (Signed)
Chief Complaint:  Abdominal Pain; Shortness of Breath; and Cough   HPI: [name] is a 30 y.o. [id number] at [redacted]w[redacted]d who presents to maternity admissions reporting SOB, coughing, abdominal pain.  States symptoms started 2-3 days ago. Denies fevers but states has been taking Tylenol regularly due to occasionally felt hot and due to RLQ pain. Patient states she has been coughing regularly and chest feels tight, feeling very short of breath when walks. Her last asthma attack was when she was 30 y/o.   Patient is a known smoker of 2 ppd, has exercise induced asthma. Patient has chronic back pain in which she takes 10/325 Percocet q4h daily. States this medication is not helping RLQ abdominal pain. Patient also has known chronic HTN, in which she states she never takes her medications for.   Denies contractions, leakage of fluid or vaginal bleeding. States decreased fetal movement.   Past Medical History: Past Medical History:  Diagnosis Date  . Asthma    exercise induced  . Chronic back pain   . Headache   . Headache(784.0)   . Kienbock's disease   . Morbid obesity (HCC)   . Trichomonas     Past obstetric history: OB History  Gravida Para Term Preterm AB Living  8 4 4  0 3 4  SAB TAB Ectopic Multiple Live Births  3 0 0 0      # Outcome Date GA Lbr Len/2nd Weight Sex Delivery Anes PTL Lv  8 Current           7 SAB 2010          6 Term 2008     CS-LTranv        Birth Comments: had BTL, GDM  5 Term 2006     CS-LTranv     4 SAB 2005          3 Term 2004     CS-LTranv     2 Term 2003     CS-LTranv        Birth Comments: breech  1 SAB 2002             Birth Comments: had D&C      Past Surgical History: Past Surgical History:  Procedure Laterality Date  . CESAREAN SECTION    . CHOLECYSTECTOMY    . choley  gall bladder  . DILATION AND CURETTAGE OF UTERUS    . DILATION AND EVACUATION  05/21/2011   Procedure: DILATATION AND EVACUATION (D&E);  Surgeon: Lazaro Arms,  MD;  Location: [facility];  Service: Gynecology;  Laterality: N/A;  . HERNIA REPAIR    . TOOTH EXTRACTION     17 teeth extracted  . TUBAL LIGATION    . WRIST SURGERY Right 2009     Family History: Family History  Problem Relation Age of Onset  . Diabetes Mother   . Hypertension Brother   . Anesthesia problems Neg Hx     Social History: Social History  Substance Use Topics  . Smoking status: Current Every Day Smoker    Packs/day: 1.00    Years: 17.00    Types: Cigarettes  . Smokeless tobacco: Never Used  . Alcohol use No    Allergies:  Allergies  Allergen Reactions  . Bee Venom Anaphylaxis  . Ultram [Tramadol Hcl] Anaphylaxis and Hives  . Compazine [Prochlorperazine Edisylate] Other (See Comments)    Made patient very depressed  . Magnesium-Containing Compounds Hives  . Cymbalta [Duloxetine Hcl] Anxiety  Meds:  Prescriptions Prior to Admission  Medication Sig Dispense Refill Last Dose  . acetaminophen (TYLENOL) 500 MG tablet Take 1,000 mg by mouth every 6 (six) hours as needed for headache.   04/04/2016 at Unknown time  . aspirin EC 81 MG tablet Take 1 tablet (81 mg total) by mouth daily. Take after 12 weeks for prevention of preeclampssia later in pregnancy 300 tablet 2 Past Week at Unknown time  . Calcium Carbonate-Simethicone (ROLAIDS MULTI-SYMPTOM PO) Take 2 tablets by mouth daily as needed (heart burn).   04/03/2016 at Unknown time  . cyanocobalamin (,VITAMIN B-12,) 1000 MCG/ML injection Inject 1ml daily x 6 days, inject 1ml once weekly x 4 weeks, then inject 1ml monthly x 10 months thereafter. 20 mL 0 Past Month at Unknown time  . Miconazole Nitrate 2 % POWD 1 each by Does not apply route 2 (two) times daily as needed. 25 g 0 04/03/2016 at Unknown time  . oxyCODONE-acetaminophen (PERCOCET) 10-325 MG tablet Take 1 tablet by mouth every 6 (six) hours as needed for pain. 60 tablet 0 04/04/2016 at Unknown time  . Prenatal Multivit-Min-Fe-FA (PRENATAL VITAMINS) 0.8 MG tablet  Take 1 tablet by mouth daily. 30 tablet 12 04/04/2016 at Unknown time  . labetalol (NORMODYNE) 100 MG tablet Take 1 tablet (100 mg total) by mouth 2 (two) times daily. 60 tablet 3 03/21/2016    I have reviewed patient's Past Medical Hx, Surgical Hx, Family Hx, Social Hx, medications and allergies.   ROS:  A comprehensive ROS was negative except per HPI.    Physical Exam  Patient Vitals for the past 24 hrs:  BP Temp Temp src Pulse Resp SpO2  04/04/16 1118 - - - - - 100 %  04/04/16 1013 142/70 98.4 F (36.9 C) Oral 92 20 94 %  04/04/16 1012 - - - - - 95 %   Constitutional: Well-developed, morbidly obese female in no acute distress.  Cardiovascular: normal rate, rhythm, no murmurs. Respiratory: Mildly tachypneic, +bilateral wheezing throughout, poor air movement with some improvement after first breathing treatment. GI: Abd soft, tender to RLQ worsens with coughing, gravid appropriate for gestational age. Pos BS x 4 MS: Extremities nontender, trace edema, normal ROM Neurologic: Alert and oriented x 4.  GU: Neg CVAT.  FHT:  Baseline 140, moderate variability, accelerations present, no decelerations Contractions: None   Labs: Results for orders placed or performed during the hospital encounter of 04/04/16 (from the past 24 hour(s))  Urinalysis, Routine w reflex microscopic (not at [facility])     Status: Abnormal   Collection Time: 04/04/16 10:17 AM  Result Value Ref Range   Color, Urine YELLOW YELLOW   APPearance CLEAR CLEAR   Specific Gravity, Urine 1.020 1.005 - 1.030   pH 6.5 5.0 - 8.0   Glucose, UA NEGATIVE NEGATIVE mg/dL   Hgb urine dipstick NEGATIVE NEGATIVE   Bilirubin Urine NEGATIVE NEGATIVE   Ketones, ur NEGATIVE NEGATIVE mg/dL   Protein, ur 30 (A) NEGATIVE mg/dL   Nitrite NEGATIVE NEGATIVE   Leukocytes, UA NEGATIVE NEGATIVE  Urine microscopic-add on     Status: Abnormal   Collection Time: 04/04/16 10:17 AM  Result Value Ref Range   Squamous Epithelial / LPF 0-5 (A) NONE  SEEN   WBC, UA 0-5 0 - 5 WBC/hpf   RBC / HPF NONE SEEN 0 - 5 RBC/hpf   Bacteria, UA FEW (A) NONE SEEN   Urine-Other MUCOUS PRESENT   CBC with Differential/Platelet     Status: Abnormal  Collection Time: 04/04/16 11:20 AM  Result Value Ref Range   WBC 13.5 (H) 4.0 - 10.5 K/uL   RBC 4.24 3.87 - 5.11 MIL/uL   Hemoglobin 12.5 12.0 - 15.0 g/dL   HCT 16.1 (L) 09.6 - 04.5 %   MCV 83.7 78.0 - 100.0 fL   MCH 29.5 26.0 - 34.0 pg   MCHC 35.2 30.0 - 36.0 g/dL   RDW 40.9 81.1 - 91.4 %   Platelets 145 (L) 150 - 400 K/uL   Neutrophils Relative % 75 %   Neutro Abs 10.2 (H) 1.7 - 7.7 K/uL   Lymphocytes Relative 19 %   Lymphs Abs 2.5 0.7 - 4.0 K/uL   Monocytes Relative 4 %   Monocytes Absolute 0.6 0.1 - 1.0 K/uL   Eosinophils Relative 2 %   Eosinophils Absolute 0.2 0.0 - 0.7 K/uL   Basophils Relative 0 %   Basophils Absolute 0.0 0.0 - 0.1 K/uL  Basic metabolic panel     Status: Abnormal   Collection Time: 04/04/16 11:20 AM  Result Value Ref Range   Sodium 136 135 - 145 mmol/L   Potassium 3.8 3.5 - 5.1 mmol/L   Chloride 107 101 - 111 mmol/L   CO2 22 22 - 32 mmol/L   Glucose, Bld 94 65 - 99 mg/dL   BUN 6 6 - 20 mg/dL   Creatinine, Ser 7.82 0.44 - 1.00 mg/dL   Calcium 8.6 (L) 8.9 - 10.3 mg/dL   GFR calc non Af Amer >60 >60 mL/min   GFR calc Af Amer >60 >60 mL/min   Anion gap 7 5 - 15    Imaging:  [place] Mfm Ob Follow Up  Result Date: 03/14/2016 OBSTETRICAL ULTRASOUND: This exam was performed within a [facility]. The OB US report was generated in the AS system, and faxed to the ordering physician.  This report is available in the [employer]. See the [facility] Obstetric US report via the Image Link.   MAU Course: Albuterol nebulizer x2 IV Solumedrol 125 mcg Afebrile CXR- neg for PNA, +bronchitis changes Resting and ambulating pulse ox - normal PreE workup - negative   I personally reviewed the patient's NST today, found to be REACTIVE. 140 bpm, mod var, +accels, no  decels. CTX: Quiet.   MDM: Plan of care reviewed with patient, including labs and tests ordered and medical treatment.   Assessment: 1. Asthma exacerbation   2. SOB (shortness of breath)   3. Bronchitis, chronic obstructive w acute bronchitis (HCC)   4. Chronic hypertension complicating or reason for care during pregnancy, third trimester     Plan: Discharge home in stable condition.  Preterm Labor precautions and fetal kick counts Treatment for acute asthma exacerbation and likely underlying COPD Recommend for pulmonology follow up    Follow-up Information    [facility] Schedule an appointment as soon as possible for a visit today.   Specialty:  Obstetrics and Gynecology Why:  f/u on asthma exacerbation Contact information: [address] [place] [address] [phone]            Medication List    TAKE these medications   acetaminophen 500 MG tablet Commonly known as:  TYLENOL Take 1,000 mg by mouth every 6 (six) hours as needed for headache.   albuterol 108 (90 Base) MCG/ACT inhaler Commonly known as:  PROVENTIL HFA;VENTOLIN HFA Inhale 2 puffs into the lungs every 4 (four) hours as needed for wheezing or shortness of breath.  aspirin EC 81 MG tablet Take 1 tablet (81 mg total) by mouth daily. Take after 12 weeks for prevention of preeclampssia later in pregnancy   cyanocobalamin 1000 MCG/ML injection Commonly known as:  (VITAMIN B-12) Inject 1ml daily x 6 days, inject 1ml once weekly x 4 weeks, then inject 1ml monthly x 10 months thereafter.   Fluticasone-Salmeterol 100-50 MCG/DOSE Aepb Commonly known as:  ADVAIR DISKUS Inhale 1 puff into the lungs 2 (two) times daily.   guaiFENesin 200 MG tablet Take 1 tablet (200 mg total) by mouth every 4 (four) hours as needed for cough or to loosen phlegm.   labetalol 100 MG tablet Commonly known as:  NORMODYNE Take 1 tablet (100 mg total) by mouth 2 (two) times  daily.   Miconazole Nitrate 2 % Powd 1 each by Does not apply route 2 (two) times daily as needed.   oxyCODONE-acetaminophen 10-325 MG tablet Commonly known as:  PERCOCET Take 1 tablet by mouth every 6 (six) hours as needed for pain.   predniSONE 10 MG (21) Tbpk tablet Commonly known as:  STERAPRED UNI-PAK 21 TAB Take 6 pills for 3 days, then 4 pills for 3 days, then 2 pills for 3 days, then 1 pill for 3 days, then stop.   Prenatal Vitamins 0.8 MG tablet Take 1 tablet by mouth daily.   ROLAIDS MULTI-SYMPTOM PO Take 2 tablets by mouth daily as needed (heart burn).       [address][name], [place] 04/04/2016 1:19 PM

## 2016-04-05 ENCOUNTER — Encounter: Payer: Self-pay | Admitting: Obstetrics and Gynecology

## 2016-04-05 DIAGNOSIS — O99119 Other diseases of the blood and blood-forming organs and certain disorders involving the immune mechanism complicating pregnancy, unspecified trimester: Secondary | ICD-10-CM

## 2016-04-05 DIAGNOSIS — D696 Thrombocytopenia, unspecified: Secondary | ICD-10-CM | POA: Insufficient documentation

## 2016-04-08 ENCOUNTER — Ambulatory Visit (INDEPENDENT_AMBULATORY_CARE_PROVIDER_SITE_OTHER): Payer: Medicaid Other | Admitting: Obstetrics and Gynecology

## 2016-04-08 VITALS — BP 147/76 | HR 83 | Wt 249.3 lb

## 2016-04-08 DIAGNOSIS — J45901 Unspecified asthma with (acute) exacerbation: Secondary | ICD-10-CM

## 2016-04-08 DIAGNOSIS — O99513 Diseases of the respiratory system complicating pregnancy, third trimester: Secondary | ICD-10-CM

## 2016-04-08 DIAGNOSIS — O10913 Unspecified pre-existing hypertension complicating pregnancy, third trimester: Secondary | ICD-10-CM

## 2016-04-08 DIAGNOSIS — O99519 Diseases of the respiratory system complicating pregnancy, unspecified trimester: Secondary | ICD-10-CM

## 2016-04-08 DIAGNOSIS — J45909 Unspecified asthma, uncomplicated: Secondary | ICD-10-CM

## 2016-04-08 MED ORDER — PEAK FLOW METER DEVI: 1.0000 [IU] | Freq: Two times a day (BID) | 0 refills | Status: DC

## 2016-04-08 MED ORDER — BUDESONIDE 180 MCG/ACT IN AEPB: 2.0000 | INHALATION_SPRAY | Freq: Two times a day (BID) | RESPIRATORY_TRACT | 2 refills | Status: DC
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
## 2016-04-10 ENCOUNTER — Encounter (HOSPITAL_COMMUNITY): Payer: Self-pay

## 2016-04-10 ENCOUNTER — Ambulatory Visit (HOSPITAL_COMMUNITY)
Admission: RE | Admit: 2016-04-10 | Discharge: 2016-04-10 | Disposition: A | Discharge status: Home / Self Care | Payer: Medicaid Other | Source: Ambulatory Visit | Attending: Advanced Practice Midwife | Admitting: Advanced Practice Midwife

## 2016-04-10 ENCOUNTER — Other Ambulatory Visit (HOSPITAL_COMMUNITY): Payer: Self-pay | Admitting: Maternal and Fetal Medicine

## 2016-04-10 DIAGNOSIS — O34219 Maternal care for unspecified type scar from previous cesarean delivery: Secondary | ICD-10-CM

## 2016-04-10 DIAGNOSIS — O99323 Drug use complicating pregnancy, third trimester: Secondary | ICD-10-CM

## 2016-04-10 DIAGNOSIS — O360190 Maternal care for anti-D [Rh] antibodies, unspecified trimester, not applicable or unspecified: Secondary | ICD-10-CM

## 2016-04-10 DIAGNOSIS — O10013 Pre-existing essential hypertension complicating pregnancy, third trimester: Secondary | ICD-10-CM | POA: Diagnosis not present

## 2016-04-10 DIAGNOSIS — O99212 Obesity complicating pregnancy, second trimester: Secondary | ICD-10-CM

## 2016-04-10 DIAGNOSIS — O283 Abnormal ultrasonic finding on antenatal screening of mother: Secondary | ICD-10-CM | POA: Diagnosis not present

## 2016-04-10 DIAGNOSIS — O99213 Obesity complicating pregnancy, third trimester: Secondary | ICD-10-CM | POA: Diagnosis not present

## 2016-04-10 DIAGNOSIS — Z3A28 28 weeks gestation of pregnancy: Secondary | ICD-10-CM

## 2016-04-10 DIAGNOSIS — O10919 Unspecified pre-existing hypertension complicating pregnancy, unspecified trimester: Secondary | ICD-10-CM

## 2016-04-10 DIAGNOSIS — O281 Abnormal biochemical finding on antenatal screening of mother: Secondary | ICD-10-CM

## 2016-04-10 DIAGNOSIS — O163 Unspecified maternal hypertension, third trimester: Secondary | ICD-10-CM

## 2016-04-16 ENCOUNTER — Ambulatory Visit (INDEPENDENT_AMBULATORY_CARE_PROVIDER_SITE_OTHER): Payer: Medicaid Other | Admitting: Obstetrics and Gynecology

## 2016-04-16 VITALS — BP 130/69 | HR 93 | Wt 247.1 lb

## 2016-04-16 DIAGNOSIS — O10913 Unspecified pre-existing hypertension complicating pregnancy, third trimester: Secondary | ICD-10-CM

## 2016-04-16 DIAGNOSIS — O360930 Maternal care for other rhesus isoimmunization, third trimester, not applicable or unspecified: Secondary | ICD-10-CM

## 2016-04-16 DIAGNOSIS — J45909 Unspecified asthma, uncomplicated: Secondary | ICD-10-CM

## 2016-04-16 DIAGNOSIS — O99513 Diseases of the respiratory system complicating pregnancy, third trimester: Secondary | ICD-10-CM

## 2016-04-16 DIAGNOSIS — Z23 Encounter for immunization: Secondary | ICD-10-CM

## 2016-04-16 DIAGNOSIS — O99519 Diseases of the respiratory system complicating pregnancy, unspecified trimester: Secondary | ICD-10-CM

## 2016-04-16 DIAGNOSIS — F1190 Opioid use, unspecified, uncomplicated: Secondary | ICD-10-CM

## 2016-04-16 DIAGNOSIS — O34219 Maternal care for unspecified type scar from previous cesarean delivery: Secondary | ICD-10-CM

## 2016-04-16 DIAGNOSIS — O99113 Other diseases of the blood and blood-forming organs and certain disorders involving the immune mechanism complicating pregnancy, third trimester: Secondary | ICD-10-CM

## 2016-04-16 DIAGNOSIS — O0993 Supervision of high risk pregnancy, unspecified, third trimester: Secondary | ICD-10-CM

## 2016-04-16 DIAGNOSIS — O26893 Other specified pregnancy related conditions, third trimester: Secondary | ICD-10-CM

## 2016-04-16 DIAGNOSIS — D696 Thrombocytopenia, unspecified: Secondary | ICD-10-CM

## 2016-04-16 DIAGNOSIS — Z6791 Unspecified blood type, Rh negative: Secondary | ICD-10-CM

## 2016-04-16 DIAGNOSIS — F17200 Nicotine dependence, unspecified, uncomplicated: Secondary | ICD-10-CM

## 2016-04-16 LAB — POCT URINALYSIS DIP (DEVICE)
Bilirubin Urine: NEGATIVE
Glucose, UA: NEGATIVE mg/dL
HGB URINE DIPSTICK: NEGATIVE
Ketones, ur: NEGATIVE mg/dL
Leukocytes, UA: NEGATIVE
Nitrite: NEGATIVE
PH: 7 (ref 5.0–8.0)
PROTEIN: 30 mg/dL — AB
SPECIFIC GRAVITY, URINE: 1.025 (ref 1.005–1.030)
UROBILINOGEN UA: 0.2 mg/dL (ref 0.0–1.0)

## 2016-04-16 MED ORDER — RANITIDINE HCL 150 MG PO TABS: 150.0000 mg | ORAL_TABLET | Freq: Two times a day (BID) | ORAL | 3 refills | Status: DC

## 2016-04-16 MED ORDER — TETANUS-DIPHTH-ACELL PERTUSSIS 5-2.5-18.5 LF-MCG/0.5 IM SUSP
0.5000 mL | Freq: Once | INTRAMUSCULAR | Status: AC
  Administered 2016-04-16: 0.5 mL via INTRAMUSCULAR

## 2016-04-16 MED ORDER — PEAK FLOW METER DEVI: 1.0000 [IU] | Freq: Two times a day (BID) | 0 refills | Status: DC

## 2016-04-16 MED ORDER — RHO D IMMUNE GLOBULIN 1500 UNIT/2ML IJ SOSY
300.0000 ug | PREFILLED_SYRINGE | Freq: Once | INTRAMUSCULAR | Status: AC
  Administered 2016-04-16: 300 ug via INTRAMUSCULAR

## 2016-04-16 NOTE — Addendum Note (Signed)
Addended by: Garret ReddishBARNES, Karmella Bouvier M on: 04/16/2016 10:58 AM   Modules accepted: Orders

## 2016-04-16 NOTE — Progress Notes (Signed)
TDap vaccine today 

## 2016-04-16 NOTE — Addendum Note (Signed)
Addended by: Garret ReddishBARNES, Berdella Bacot M on: 04/16/2016 11:58 AM   Modules accepted: Orders
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
## 2016-04-18 ENCOUNTER — Other Ambulatory Visit: Payer: Medicaid Other

## 2016-04-18 ENCOUNTER — Ambulatory Visit (INDEPENDENT_AMBULATORY_CARE_PROVIDER_SITE_OTHER): Payer: Medicaid Other | Admitting: Pulmonary Disease

## 2016-04-18 ENCOUNTER — Encounter: Payer: Self-pay | Admitting: Pulmonary Disease

## 2016-04-18 ENCOUNTER — Telehealth: Payer: Self-pay | Admitting: Pulmonary Disease

## 2016-04-18 ENCOUNTER — Other Ambulatory Visit: Payer: Self-pay | Admitting: Pulmonary Disease

## 2016-04-18 DIAGNOSIS — J4550 Severe persistent asthma, uncomplicated: Secondary | ICD-10-CM | POA: Insufficient documentation

## 2016-04-18 DIAGNOSIS — F17200 Nicotine dependence, unspecified, uncomplicated: Secondary | ICD-10-CM | POA: Insufficient documentation

## 2016-04-18 DIAGNOSIS — K219 Gastro-esophageal reflux disease without esophagitis: Secondary | ICD-10-CM

## 2016-04-18 DIAGNOSIS — J309 Allergic rhinitis, unspecified: Secondary | ICD-10-CM | POA: Insufficient documentation

## 2016-04-18 MED ORDER — BECLOMETHASONE DIPROPIONATE 80 MCG/ACT IN AERS: 2.0000 | INHALATION_SPRAY | Freq: Two times a day (BID) | RESPIRATORY_TRACT | 3 refills | Status: DC

## 2016-04-18 NOTE — Patient Instructions (Signed)
   Use the Qvar inhaler I am prescribing you every day. Inhale 2 puffs twice daily.   Make sure you brush your teeth, brush your tongue, rinse, gargle and spit after you use the Qvar inhaler because you can get thrush which is a white film on your tongue. If this happens call me.  Use your Albuterol inhaler as needed. Inhale 2 puffs every 4 hours as needed for any shortness of breath, coughing, or wheezing.  Avoid all smoke exposure you can.  Keep trying to wean yourself off of cigarettes just like you are doing.  Call me if you have any questions or any new breathing problems before your next appointment.  I will see you back in 4 weeks.  TESTS ORDERED: 1. Spirometry with bronchodilator challenge at next appointment 2. RAST Panel
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
## 2016-04-18 NOTE — Telephone Encounter (Signed)
IMAGING CXR PA/LAT 04/04/16 (personally reviewed by me): No focal opacity or nodule appreciated. No pleural effusion. Heart normal in size & mediastinum normal in contour.

## 2016-04-21 LAB — RESPIRATORY ALLERGY PROFILE REGION II ~~LOC~~
ALLERGEN, COTTONWOOD, T14: 0.54 kU/L — AB
ALLERGEN, D PTERNOYSSINUS, D1: 0.39 kU/L — AB
ALLERGEN, MULBERRY, T70: 0.36 kU/L — AB
ALLERGEN, OAK, T7: 0.5 kU/L — AB
Allergen, A. alternata, m6: <0.1 kU/L
Allergen, Cedar tree, t12: 0.37 kU/L — ABNORMAL HIGH
Allergen, Comm Silver Birch, t9: 0.45 kU/L — ABNORMAL HIGH
Allergen, Mouse Urine Protein, e78: <0.1 kU/L
Allergen, P. notatum, m1: 0.11 kU/L — ABNORMAL HIGH
Aspergillus fumigatus, m3: 0.35 kU/L — ABNORMAL HIGH
BERMUDA GRASS: 3.5 kU/L — AB
BOX ELDER: 0.68 kU/L — AB
CAT DANDER: 0.12 kU/L — AB
COCKROACH: 0.78 kU/L — AB
Common Ragweed: 0.57 kU/L — ABNORMAL HIGH
D. FARINAE: 0.41 kU/L — AB
DOG DANDER: 0.27 kU/L — AB
ELM IGE: 0.61 kU/L — AB
IgE (Immunoglobulin E), Serum: 1363 kU/L — ABNORMAL HIGH (ref <115)
JOHNSON GRASS: 6.23 kU/L — AB
PECAN/HICKORY TREE IGE: 0.44 kU/L — AB
Rough Pigweed  IgE: 0.47 kU/L — ABNORMAL HIGH
SHEEP SORREL IGE: 0.97 kU/L — AB
Timothy Grass: 16.8 kU/L — ABNORMAL HIGH

## 2016-04-21 LAB — PAIN MGMT, PROFILE 6 CONF W/O MM, U
6 ACETYLMORPHINE: NEGATIVE ng/mL (ref <10)
AMPHETAMINES: NEGATIVE ng/mL (ref <500)
Alcohol Metabolites: NEGATIVE ng/mL (ref <500)
BARBITURATES: NEGATIVE ng/mL (ref <300)
Benzodiazepines: NEGATIVE ng/mL (ref <100)
COCAINE METABOLITE: NEGATIVE ng/mL (ref <150)
Codeine: NEGATIVE ng/mL (ref <50)
Creatinine: 113.3 mg/dL (ref >=20.0)
ETHYL SULFATE (ETS): NEGATIVE ng/mL (ref <100)
Ethyl Glucuronide (ETG): NEGATIVE ng/mL (ref <500)
HYDROMORPHONE: NEGATIVE ng/mL (ref <50)
Hydrocodone: NEGATIVE ng/mL (ref <50)
MORPHINE: NEGATIVE ng/mL (ref <50)
Marijuana Metabolite: NEGATIVE ng/mL (ref <20)
Methadone Metabolite: NEGATIVE ng/mL (ref <100)
NORHYDROCODONE: NEGATIVE ng/mL (ref <50)
NOROXYCODONE: 1991 ng/mL — AB (ref <50)
OPIATES: NEGATIVE ng/mL (ref <100)
OXIDANT: NEGATIVE ug/mL (ref <200)
OXYCODONE: 655 ng/mL — AB (ref <50)
OXYCODONE: POSITIVE ng/mL — AB (ref <100)
Oxymorphone: 3520 ng/mL — ABNORMAL HIGH (ref <50)
PLEASE NOTE: 0
Phencyclidine: NEGATIVE ng/mL (ref <25)
pH: 6.47 (ref 4.5–9.0)

## 2016-04-22 ENCOUNTER — Ambulatory Visit (INDEPENDENT_AMBULATORY_CARE_PROVIDER_SITE_OTHER): Payer: Medicaid Other | Admitting: Family

## 2016-04-22 VITALS — BP 141/67 | HR 102 | Wt 244.1 lb

## 2016-04-22 DIAGNOSIS — O09892 Supervision of other high risk pregnancies, second trimester: Secondary | ICD-10-CM

## 2016-04-22 DIAGNOSIS — O0993 Supervision of high risk pregnancy, unspecified, third trimester: Secondary | ICD-10-CM

## 2016-04-22 DIAGNOSIS — O99519 Diseases of the respiratory system complicating pregnancy, unspecified trimester: Secondary | ICD-10-CM

## 2016-04-22 DIAGNOSIS — M549 Dorsalgia, unspecified: Secondary | ICD-10-CM

## 2016-04-22 DIAGNOSIS — Z6791 Unspecified blood type, Rh negative: Secondary | ICD-10-CM

## 2016-04-22 DIAGNOSIS — O99513 Diseases of the respiratory system complicating pregnancy, third trimester: Secondary | ICD-10-CM

## 2016-04-22 DIAGNOSIS — J45909 Unspecified asthma, uncomplicated: Secondary | ICD-10-CM

## 2016-04-22 DIAGNOSIS — G8929 Other chronic pain: Secondary | ICD-10-CM

## 2016-04-22 DIAGNOSIS — O24419 Gestational diabetes mellitus in pregnancy, unspecified control: Secondary | ICD-10-CM

## 2016-04-22 DIAGNOSIS — O9989 Other specified diseases and conditions complicating pregnancy, childbirth and the puerperium: Secondary | ICD-10-CM

## 2016-04-22 DIAGNOSIS — O10913 Unspecified pre-existing hypertension complicating pregnancy, third trimester: Secondary | ICD-10-CM

## 2016-04-22 DIAGNOSIS — O34219 Maternal care for unspecified type scar from previous cesarean delivery: Secondary | ICD-10-CM

## 2016-04-22 DIAGNOSIS — O26892 Other specified pregnancy related conditions, second trimester: Secondary | ICD-10-CM

## 2016-04-22 LAB — CBC
HEMATOCRIT: 38.8 % (ref 35.0–45.0)
HEMOGLOBIN: 13.2 g/dL (ref 11.7–15.5)
MCH: 29.7 pg (ref 27.0–33.0)
MCHC: 34 g/dL (ref 32.0–36.0)
MCV: 87.2 fL (ref 80.0–100.0)
MPV: 11.6 fL (ref 7.5–12.5)
Platelets: 152 10*3/uL (ref 140–400)
RBC: 4.45 MIL/uL (ref 3.80–5.10)
RDW: 13.7 % (ref 11.0–15.0)
WBC: 12.7 10*3/uL — AB (ref 3.8–10.8)

## 2016-04-22 LAB — POCT URINALYSIS DIP (DEVICE)
GLUCOSE, UA: NEGATIVE mg/dL
Hgb urine dipstick: NEGATIVE
KETONES UR: NEGATIVE mg/dL
Nitrite: NEGATIVE
PROTEIN: 30 mg/dL — AB
SPECIFIC GRAVITY, URINE: 1.02 (ref 1.005–1.030)
Urobilinogen, UA: 1 mg/dL (ref 0.0–1.0)
pH: 7 (ref 5.0–8.0)

## 2016-04-22 NOTE — Patient Instructions (Signed)
AREA PEDIATRIC/FAMILY PRACTICE PHYSICIANS  [facility] [address], [address] [place], [place]  [address] Phone - [phone]   Fax - [phone]  [facility] [address] [place], [place] [address] Phone - [phone]   Fax - [phone]  [facility] [address] [place], [place]  [address] Phone - [phone]   Fax - [phone]  [facility] [address] [place], [place]  [address] Phone - [phone]   Fax - [phone]  [facility] [address] [place], [place]  [address] Phone - [phone]   Fax - [phone]  [facility] [address] [place], [place]  [address] Phone - [phone]   Fax - [phone]  [facility] [address] [place], [place]  [address] Phone - [phone]   Fax - [phone]  [facility] [address] [place], [place]  [address] Phone - [phone]   Fax - [phone]  [facility] [address] [place], [place]  [address] Phone - [phone]   Fax - [phone] [facility] [address] [place], [place]  [address] Phone - [phone]   Fax - [phone]  [facility] [address] [place], [place]  [address] Phone - [phone]   Fax - [phone]  [facility] [address] [place], [place]  [address] Phone - [phone]   Fax - [phone]  [facility] [address] [place], [place]  [address] Phone - [phone]   Fax - [phone]  [facility] [address] [place], [place]  [address] Phone - [phone]  [facility] [address] [place], [place]  [address] Phone - [phone]   Fax - [phone]  [facility] [address] [place], [place]  [address] Phone - [phone]   Fax - [phone]  [facility] [address] [place], [place]  [address] Phone - [phone]   Fax - [phone]  [facility] [address] [place], [place]  [address] Phone - [phone]   Fax - [phone]   [facility] [address] [place], [place]  [address] Phone - [phone]   Fax - [phone]  [facility] [address] [place], [place]  [address] Phone - [phone]   Fax - [phone]  [facility] [address] [place], [place]  [address] Phone - [phone]   Fax - [phone]  [facility] [address] [place], [place]  [address] Phone - [phone]   Fax - [phone]  [facility] [address] [place], [place]  [address] Phone - [phone]   Fax - [phone] [facility] [address] [place], [place]  [address] Phone - [phone]   Fax - [phone]  [facility] [address] [place], [place]  [address] Phone - [phone]   Fax - [phone]  [facility] [address] [place],   [address] Phone - [phone]   Fax - [phone]   
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
## 2016-04-22 NOTE — Progress Notes (Signed)
28 wk labs today glucola due @ 1044 28 wk packet given

## 2016-04-23 ENCOUNTER — Telehealth: Payer: Self-pay | Admitting: *Deleted

## 2016-04-23 DIAGNOSIS — O24410 Gestational diabetes mellitus in pregnancy, diet controlled: Secondary | ICD-10-CM

## 2016-04-23 DIAGNOSIS — O24419 Gestational diabetes mellitus in pregnancy, unspecified control: Secondary | ICD-10-CM | POA: Insufficient documentation

## 2016-04-23 LAB — GLUCOSE TOLERANCE, 1 HOUR (50G) W/O FASTING: Glucose, 1 Hr, gestational: 209 mg/dL — ABNORMAL HIGH (ref <140)

## 2016-04-23 LAB — RPR

## 2016-04-23 LAB — HIV ANTIBODY (ROUTINE TESTING W REFLEX): HIV: NONREACTIVE

## 2016-04-23 NOTE — Telephone Encounter (Signed)
Contacted patient and gave her results of gtt and dx gestational diabetes. Patient asked if the result could be affected by the fact that she "lives off of chocolate and [address]". I advised that that diet during pregnancy could indeed affect the result but she really needs to have diabetic and nutrition counseling to talk about it. [facility] Upon checking appointment schedule was told Maggie's schedule was full. Contacted [facility], who found a slot for her in mfm 10/12 @ 1:30 pm. Appointment has been made, will contact patient to let her know. Patient has been notified of appointment and stats she will be there.

## 2016-04-23 NOTE — Telephone Encounter (Signed)
-----   Message from Marlis EdelsonWalidah N Karim, CNM sent at 04/23/2016  8:15 AM EDT ----- Regarding: Need Dibetic Teaching Please call patient and notify regarding diagnosis of GDM and schedule diabetic education.  ----- Message ----- From: Interface, Lab In Three Zero Five Sent: 04/22/2016   7:54 PM To: UJWJXBJWalidah Kennith GainN Karim, CNM

## 2016-04-24 ENCOUNTER — Encounter: Payer: Medicaid Other | Attending: Family | Admitting: *Deleted

## 2016-04-24 ENCOUNTER — Other Ambulatory Visit: Payer: Self-pay | Admitting: *Deleted

## 2016-04-24 ENCOUNTER — Ambulatory Visit (HOSPITAL_COMMUNITY)
Admission: RE | Admit: 2016-04-24 | Discharge: 2016-04-24 | Disposition: A | Discharge status: Home / Self Care | Payer: Medicaid Other | Source: Ambulatory Visit | Attending: Family | Admitting: Family

## 2016-04-24 ENCOUNTER — Other Ambulatory Visit: Payer: Medicaid Other

## 2016-04-24 DIAGNOSIS — O24410 Gestational diabetes mellitus in pregnancy, diet controlled: Secondary | ICD-10-CM

## 2016-04-24 DIAGNOSIS — G8929 Other chronic pain: Secondary | ICD-10-CM

## 2016-04-24 MED ORDER — OXYCODONE-ACETAMINOPHEN 10-325 MG PO TABS: 1.0000 | ORAL_TABLET | Freq: Four times a day (QID) | ORAL | 0 refills | Status: DC | PRN

## 2016-04-24 NOTE — Progress Notes (Signed)
Per diet history patient has history of drinking 24 cans of [employer]ountain Dew a day, which she states she has decreased to 8 per day. She states she often skips breakfast and lunch and eats what she prepares for her children for supper; meat with vegetables and some starches in small portions. 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 She also states history of GDM with her now 32 year old daughter and she expresses anxiety about having GDM again. She also expressed concern over her high blood pressure but when I addressed her smoking, she stated she has cut back from 2 packs a day to about 1 PPD.   After teaching her about carb counting and how to read food labels for Total Carbohydrate of foods, I informed her of the carbohydrate content of regular soda and she immediately expressed understanding of the extreme impact the soda could have on her BG control. She was then willing to discuss other beverage options that she said she was willing to try.

## 2016-04-24 NOTE — Progress Notes (Signed)
Patient presents to clinic for lab draw. Requesting prescription for percocet. Per pain contract, she cannot have prescription till 10/13. After consulting Dr Shawnie PonsPratt, a new prescription was written and given to patient, Dr Shawnie PonsPratt wrote on script "Not to be filled until 10/13".

## 2016-04-24 NOTE — Addendum Note (Signed)
Encounter addended by: Vevelyn RoyalsBeverly W Jaelen Soth, RD on: 04/24/2016  4:51 PM<BR>    Actions taken: Sign clinical note

## 2016-04-24 NOTE — Progress Notes (Signed)
  Patient was seen on 04/24/16 for Gestational Diabetes self-management education. The following learning objectives were met by the patient during this course:   States the definition of Gestational Diabetes  States why dietary management is important in controlling blood glucose  Describes the effects each nutrient has on blood glucose levels  Demonstrates ability to create a balanced meal plan  Demonstrates carbohydrate counting   States when to check blood glucose levels  Demonstrates proper blood glucose monitoring techniques  States the effect of stress and exercise on blood glucose levels  States the importance of limiting caffeine and abstaining from alcohol and smoking  Blood glucose monitor given: AccuChek Guide Meter with Fast Clix lancing device Lot # [id number] Exp: 03/31/2017 Blood glucose reading: 83 mg/dl  Patient instructed to monitor glucose levels: FBS: 60 - <90 2 hour: <120  *Patient received handouts:  Nutrition Diabetes and Pregnancy  Carbohydrate Counting List  Patient will be seen for follow-up as needed.

## 2016-04-25 LAB — ANTIBODY SCREEN: ANTIBODY SCREEN: POSITIVE — AB

## 2016-04-25 LAB — PRENATAL ANTIBODY IDENTIFICATION

## 2016-04-25 LAB — ANTIBODY TITER (PRENATAL TITER): Ab Titer: <2

## 2016-04-27 ENCOUNTER — Encounter: Payer: Self-pay | Admitting: Family

## 2016-04-27 DIAGNOSIS — R760 Raised antibody titer: Secondary | ICD-10-CM | POA: Insufficient documentation

## 2016-05-06 ENCOUNTER — Encounter: Payer: Self-pay | Admitting: Obstetrics and Gynecology

## 2016-05-08 ENCOUNTER — Ambulatory Visit (HOSPITAL_COMMUNITY)
Admission: RE | Admit: 2016-05-08 | Discharge: 2016-05-08 | Disposition: A | Discharge status: Home / Self Care | Payer: Medicaid Other | Source: Ambulatory Visit | Attending: Family | Admitting: Family

## 2016-05-08 ENCOUNTER — Other Ambulatory Visit (HOSPITAL_COMMUNITY): Payer: Self-pay | Admitting: *Deleted

## 2016-05-08 ENCOUNTER — Encounter (HOSPITAL_COMMUNITY): Payer: Self-pay

## 2016-05-08 ENCOUNTER — Other Ambulatory Visit: Payer: Self-pay | Admitting: Family

## 2016-05-08 DIAGNOSIS — O99213 Obesity complicating pregnancy, third trimester: Secondary | ICD-10-CM

## 2016-05-08 DIAGNOSIS — O10019 Pre-existing essential hypertension complicating pregnancy, unspecified trimester: Secondary | ICD-10-CM

## 2016-05-08 DIAGNOSIS — O281 Abnormal biochemical finding on antenatal screening of mother: Secondary | ICD-10-CM

## 2016-05-08 DIAGNOSIS — O24419 Gestational diabetes mellitus in pregnancy, unspecified control: Secondary | ICD-10-CM

## 2016-05-08 DIAGNOSIS — O24410 Gestational diabetes mellitus in pregnancy, diet controlled: Secondary | ICD-10-CM | POA: Insufficient documentation

## 2016-05-08 DIAGNOSIS — O280 Abnormal hematological finding on antenatal screening of mother: Secondary | ICD-10-CM

## 2016-05-08 DIAGNOSIS — O10013 Pre-existing essential hypertension complicating pregnancy, third trimester: Secondary | ICD-10-CM | POA: Diagnosis not present

## 2016-05-08 DIAGNOSIS — O34219 Maternal care for unspecified type scar from previous cesarean delivery: Secondary | ICD-10-CM | POA: Diagnosis not present

## 2016-05-08 DIAGNOSIS — Z3A32 32 weeks gestation of pregnancy: Secondary | ICD-10-CM | POA: Diagnosis not present

## 2016-05-08 DIAGNOSIS — O99323 Drug use complicating pregnancy, third trimester: Secondary | ICD-10-CM

## 2016-05-08 DIAGNOSIS — O26892 Other specified pregnancy related conditions, second trimester: Secondary | ICD-10-CM

## 2016-05-08 DIAGNOSIS — O99333 Smoking (tobacco) complicating pregnancy, third trimester: Secondary | ICD-10-CM | POA: Insufficient documentation

## 2016-05-08 DIAGNOSIS — O283 Abnormal ultrasonic finding on antenatal screening of mother: Secondary | ICD-10-CM | POA: Diagnosis not present

## 2016-05-08 DIAGNOSIS — Z6791 Unspecified blood type, Rh negative: Secondary | ICD-10-CM

## 2016-05-08 DIAGNOSIS — O10913 Unspecified pre-existing hypertension complicating pregnancy, third trimester: Secondary | ICD-10-CM

## 2016-05-08 DIAGNOSIS — O09892 Supervision of other high risk pregnancies, second trimester: Secondary | ICD-10-CM

## 2016-05-08 DIAGNOSIS — R760 Raised antibody titer: Secondary | ICD-10-CM

## 2016-05-14 ENCOUNTER — Ambulatory Visit (INDEPENDENT_AMBULATORY_CARE_PROVIDER_SITE_OTHER): Payer: Medicaid Other | Admitting: Advanced Practice Midwife

## 2016-05-14 ENCOUNTER — Ambulatory Visit (INDEPENDENT_AMBULATORY_CARE_PROVIDER_SITE_OTHER): Payer: Medicaid Other | Admitting: Clinical

## 2016-05-14 ENCOUNTER — Encounter: Payer: Self-pay | Admitting: Advanced Practice Midwife

## 2016-05-14 VITALS — BP 149/92 | HR 102 | Wt 260.0 lb

## 2016-05-14 DIAGNOSIS — O10913 Unspecified pre-existing hypertension complicating pregnancy, third trimester: Secondary | ICD-10-CM

## 2016-05-14 DIAGNOSIS — O26892 Other specified pregnancy related conditions, second trimester: Secondary | ICD-10-CM

## 2016-05-14 DIAGNOSIS — O0993 Supervision of high risk pregnancy, unspecified, third trimester: Secondary | ICD-10-CM

## 2016-05-14 DIAGNOSIS — O24419 Gestational diabetes mellitus in pregnancy, unspecified control: Secondary | ICD-10-CM

## 2016-05-14 DIAGNOSIS — F411 Generalized anxiety disorder: Secondary | ICD-10-CM

## 2016-05-14 DIAGNOSIS — Z6791 Unspecified blood type, Rh negative: Secondary | ICD-10-CM

## 2016-05-14 LAB — POCT URINALYSIS DIP (DEVICE)
BILIRUBIN URINE: NEGATIVE
Glucose, UA: NEGATIVE mg/dL
HGB URINE DIPSTICK: NEGATIVE
Ketones, ur: NEGATIVE mg/dL
Leukocytes, UA: NEGATIVE
NITRITE: NEGATIVE
PH: 7 (ref 5.0–8.0)
PROTEIN: NEGATIVE mg/dL
Specific Gravity, Urine: 1.025 (ref 1.005–1.030)
UROBILINOGEN UA: 0.2 mg/dL (ref 0.0–1.0)

## 2016-05-14 LAB — GLUCOSE, CAPILLARY: GLUCOSE-CAPILLARY: 113 mg/dL — AB (ref 65–99)

## 2016-05-14 MED ORDER — ACCU-CHEK FASTCLIX LANCETS MISC: 1.0000 | Freq: Four times a day (QID) | 12 refills | Status: DC

## 2016-05-14 MED ORDER — GLUCOSE BLOOD VI STRP: ORAL_STRIP | 12 refills | Status: DC

## 2016-05-14 NOTE — Progress Notes (Signed)
Pt has labetalol but not taking it. She states that what we consider to be high blood pressure is not high for her.
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
## 2016-05-14 NOTE — BH Specialist Note (Signed)
Session Start time: 10:45  End Time: 11:06 Total Time:  21 minutes Type of Service: Behavioral Health - Individual/Family Interpreter: No.   Interpreter Name & Language: n/a # [facility]   SUBJECTIVE: [name] is a 30 y.o. female  Pt. was f/u for:  anxiety, depression Pt. reports the following symptoms/concerns: Pt states that she is still becoming irritated at people around her, primarily about her crowded living space, including 5 dogs. Pt copes by looking forward to people moving out of house(and taking their dogs with them), and giving birth. 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 Does does not think soda/caffeine consumption affects anxiety level, and has doubts about safety of BH meds and breastfeeding.  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  Pt may benefit from brief therapeutic intervention regarding coping with symptoms of anxiety(and increasing depression), along with referral to community behavioral health.     PLAN: 1. F/U with behavioral health clinician: As needed 2.  Behavioral Health meds: none 3. Behavioral recommendations:  -Continue to find things to look forward to daily -[facility] and [facility] -Consider contacting [facility] for any indecision regarding BH meds and breastfeeding -Consider cutting back on [place] consumption, to help relieve anxiety 4. Referral: Brief Counseling/Psychotherapy   Gaynell FaceJamie C Hulbert Branscome LCSWA Behavioral Health Clinician  Warmhandoff:no  Dep[facility] 04/16/2016 03/27/2016 03/05/2016  Decreased Interest 3 1 2   Down, Depressed, Hopeless 3 0 3  PHQ - 2 Score 6 1 5   Altered sleeping 3 3 3   Tired, decreased energy 3 2 3   Change in appetite 2 1 0  Feeling bad or failure about yourself  0 0 0  Trouble concentrating 2 0 0  Moving slowly or fidgety/restless 3 2 0  Suicidal thoughts 0 0 0  PHQ-9 Score 19 9 11     GAD 7 : Generalized Anxiety Score 04/16/2016 03/27/2016 03/05/2016  Nervous, Anxious, on Edge 3 3 2   Control/stop worrying 3 3 3   Worry too much - different things 3 3 3   Trouble relaxing 3 3 3   Restless 2 3 1   Easily annoyed or irritable 3 3 1   Afraid - awful might happen 3 3 3   Total GAD 7 Score 20 21 16

## 2016-05-19 ENCOUNTER — Ambulatory Visit (INDEPENDENT_AMBULATORY_CARE_PROVIDER_SITE_OTHER): Payer: Medicaid Other | Admitting: Obstetrics & Gynecology

## 2016-05-19 VITALS — BP 144/88 | HR 95 | Wt 265.1 lb

## 2016-05-19 DIAGNOSIS — O10913 Unspecified pre-existing hypertension complicating pregnancy, third trimester: Secondary | ICD-10-CM | POA: Diagnosis not present

## 2016-05-19 DIAGNOSIS — O0993 Supervision of high risk pregnancy, unspecified, third trimester: Secondary | ICD-10-CM

## 2016-05-19 DIAGNOSIS — O24419 Gestational diabetes mellitus in pregnancy, unspecified control: Secondary | ICD-10-CM | POA: Diagnosis not present

## 2016-05-19 DIAGNOSIS — O99810 Abnormal glucose complicating pregnancy: Secondary | ICD-10-CM

## 2016-05-19 LAB — POCT URINALYSIS DIP (DEVICE)
BILIRUBIN URINE: NEGATIVE
Glucose, UA: NEGATIVE mg/dL
HGB URINE DIPSTICK: NEGATIVE
Ketones, ur: NEGATIVE mg/dL
NITRITE: NEGATIVE
PH: 7 (ref 5.0–8.0)
Protein, ur: 30 mg/dL — AB
SPECIFIC GRAVITY, URINE: 1.015 (ref 1.005–1.030)
Urobilinogen, UA: 0.2 mg/dL (ref 0.0–1.0)

## 2016-05-19 NOTE — Progress Notes (Signed)
US for growth scheduled 11/24.  Pt denies H/A or blurry vision. She reports 2 episodes of dizziness recently. Pt states she is taking oxycodone twice daily. She has not picked up diabetes testing supplies from the pharmacy yet.
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
## 2016-05-19 NOTE — Patient Instructions (Signed)

## 2016-05-22 ENCOUNTER — Ambulatory Visit: Payer: Self-pay

## 2016-05-22 ENCOUNTER — Ambulatory Visit (INDEPENDENT_AMBULATORY_CARE_PROVIDER_SITE_OTHER): Payer: Medicaid Other | Admitting: Obstetrics and Gynecology

## 2016-05-22 VITALS — BP 149/96 | HR 99

## 2016-05-22 DIAGNOSIS — O10913 Unspecified pre-existing hypertension complicating pregnancy, third trimester: Secondary | ICD-10-CM | POA: Diagnosis not present

## 2016-05-22 DIAGNOSIS — O24419 Gestational diabetes mellitus in pregnancy, unspecified control: Secondary | ICD-10-CM

## 2016-05-22 DIAGNOSIS — Z3689 Encounter for other specified antenatal screening: Secondary | ICD-10-CM

## 2016-05-22 MED ORDER — BLOOD GLUCOSE METER KIT: PACK | 0 refills | Status: DC

## 2016-05-22 NOTE — Progress Notes (Signed)
Pt states she has not taken Labetalol yet today.  She denies H/A or visual disturbances. She is not yet checking CBG's because she got the wrong strips for her meter. I called and spoke with the pharmacist who stated that pt's meter (Accu-chek Guide) is not on the preferred list for Medicaid and therefore they cannot give her the appropriate strips. New Rx sent for Accu-chek Aviva meter which is covered by Medicaid. Pt already has strips which will work with this meter. Pt agreed to pick up new meter today and begin checking CBG 4 times daily as directed. Pt advised that she must take Labetalol as directed.  She agreed to take it upon returning home today.

## 2016-05-23 NOTE — Progress Notes (Signed)
See nurses note Reactive NST

## 2016-05-26 ENCOUNTER — Encounter: Payer: Self-pay | Admitting: Obstetrics and Gynecology

## 2016-05-26 ENCOUNTER — Ambulatory Visit (INDEPENDENT_AMBULATORY_CARE_PROVIDER_SITE_OTHER): Payer: Medicaid Other | Admitting: Obstetrics and Gynecology

## 2016-05-26 VITALS — BP 152/90 | HR 111 | Wt 258.3 lb

## 2016-05-26 DIAGNOSIS — F17200 Nicotine dependence, unspecified, uncomplicated: Secondary | ICD-10-CM | POA: Diagnosis not present

## 2016-05-26 DIAGNOSIS — O99519 Diseases of the respiratory system complicating pregnancy, unspecified trimester: Secondary | ICD-10-CM

## 2016-05-26 DIAGNOSIS — O99513 Diseases of the respiratory system complicating pregnancy, third trimester: Secondary | ICD-10-CM | POA: Diagnosis not present

## 2016-05-26 DIAGNOSIS — D696 Thrombocytopenia, unspecified: Secondary | ICD-10-CM

## 2016-05-26 DIAGNOSIS — J45909 Unspecified asthma, uncomplicated: Secondary | ICD-10-CM | POA: Diagnosis not present

## 2016-05-26 DIAGNOSIS — O99113 Other diseases of the blood and blood-forming organs and certain disorders involving the immune mechanism complicating pregnancy, third trimester: Secondary | ICD-10-CM

## 2016-05-26 DIAGNOSIS — O24419 Gestational diabetes mellitus in pregnancy, unspecified control: Secondary | ICD-10-CM | POA: Diagnosis present

## 2016-05-26 DIAGNOSIS — G8929 Other chronic pain: Secondary | ICD-10-CM | POA: Diagnosis not present

## 2016-05-26 DIAGNOSIS — Z6791 Unspecified blood type, Rh negative: Secondary | ICD-10-CM

## 2016-05-26 DIAGNOSIS — O34219 Maternal care for unspecified type scar from previous cesarean delivery: Secondary | ICD-10-CM

## 2016-05-26 DIAGNOSIS — O0993 Supervision of high risk pregnancy, unspecified, third trimester: Secondary | ICD-10-CM

## 2016-05-26 DIAGNOSIS — O26893 Other specified pregnancy related conditions, third trimester: Secondary | ICD-10-CM

## 2016-05-26 DIAGNOSIS — O10913 Unspecified pre-existing hypertension complicating pregnancy, third trimester: Secondary | ICD-10-CM

## 2016-05-26 LAB — POCT URINALYSIS DIP (DEVICE)
Bilirubin Urine: NEGATIVE
Glucose, UA: NEGATIVE mg/dL
Hgb urine dipstick: NEGATIVE
Leukocytes, UA: NEGATIVE
NITRITE: NEGATIVE
PH: 7 (ref 5.0–8.0)
PROTEIN: 100 mg/dL — AB
Specific Gravity, Urine: 1.025 (ref 1.005–1.030)
Urobilinogen, UA: 0.2 mg/dL (ref 0.0–1.0)

## 2016-05-26 LAB — COMPREHENSIVE METABOLIC PANEL
ALT: 11 U/L (ref 6–29)
AST: 14 U/L (ref 10–30)
Albumin: 3 g/dL — ABNORMAL LOW (ref 3.6–5.1)
Alkaline Phosphatase: 122 U/L — ABNORMAL HIGH (ref 33–115)
BILIRUBIN TOTAL: 0.3 mg/dL (ref 0.2–1.2)
BUN: 7 mg/dL (ref 7–25)
CO2: 23 mmol/L (ref 20–31)
CREATININE: 0.59 mg/dL (ref 0.50–1.10)
Calcium: 8.8 mg/dL (ref 8.6–10.2)
Chloride: 106 mmol/L (ref 98–110)
GLUCOSE: 121 mg/dL — AB (ref 65–99)
Potassium: 4.1 mmol/L (ref 3.5–5.3)
SODIUM: 136 mmol/L (ref 135–146)
Total Protein: 6.1 g/dL (ref 6.1–8.1)

## 2016-05-26 LAB — HEMOGLOBIN A1C
Hgb A1c MFr Bld: 5.7 % — ABNORMAL HIGH (ref <5.7)
Mean Plasma Glucose: 117 mg/dL

## 2016-05-26 MED ORDER — INSULIN ASPART 100 UNIT/ML ~~LOC~~ SOLN: 10.0000 [IU] | Freq: Three times a day (TID) | SUBCUTANEOUS | 12 refills | Status: DC

## 2016-05-26 MED ORDER — "INSULIN SYRINGE-NEEDLE U-100 30G X 5/16"" 0.5 ML MISC": 1.0000 [IU] | Freq: Every day | 3 refills | Status: DC

## 2016-05-26 MED ORDER — OXYCODONE-ACETAMINOPHEN 10-325 MG PO TABS: 1.0000 | ORAL_TABLET | Freq: Four times a day (QID) | ORAL | 0 refills | Status: DC | PRN

## 2016-05-26 MED ORDER — INSULIN NPH (HUMAN) (ISOPHANE) 100 UNIT/ML ~~LOC~~ SUSP: SUBCUTANEOUS | 3 refills | Status: DC

## 2016-05-26 NOTE — Progress Notes (Signed)
Pt is extremely upset and anxious regarding a social situation involving her brother-in-law.  US for growth and BPP scheduled 11/24.  Pt states she has not taken her BP medication x 4 days. Pt is requesting refill of Oxycodone today.
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
## 2016-05-27 ENCOUNTER — Ambulatory Visit (INDEPENDENT_AMBULATORY_CARE_PROVIDER_SITE_OTHER): Payer: Medicaid Other | Admitting: Pulmonary Disease

## 2016-05-27 ENCOUNTER — Encounter: Payer: Self-pay | Admitting: Pulmonary Disease

## 2016-05-27 VITALS — BP 142/100 | HR 110 | Ht 66.0 in | Wt 256.0 lb

## 2016-05-27 DIAGNOSIS — F17200 Nicotine dependence, unspecified, uncomplicated: Secondary | ICD-10-CM | POA: Diagnosis not present

## 2016-05-27 DIAGNOSIS — J4550 Severe persistent asthma, uncomplicated: Secondary | ICD-10-CM

## 2016-05-27 DIAGNOSIS — K219 Gastro-esophageal reflux disease without esophagitis: Secondary | ICD-10-CM | POA: Diagnosis not present

## 2016-05-27 DIAGNOSIS — J309 Allergic rhinitis, unspecified: Secondary | ICD-10-CM

## 2016-05-27 LAB — PULMONARY FUNCTION TEST
FEF 25-75 PRE: 2.56 L/s
FEF 25-75 Post: 2.98 L/sec
FEF2575-%CHANGE-POST: 16 %
FEF2575-%Pred-Post: 84 %
FEF2575-%Pred-Pre: 73 %
FEV1-%CHANGE-POST: 4 %
FEV1-%PRED-POST: 87 %
FEV1-%Pred-Pre: 83 %
FEV1-PRE: 2.81 L
FEV1-Post: 2.92 L
FEV1FVC-%Change-Post: 2 %
FEV1FVC-%Pred-Pre: 90 %
FEV6-%Change-Post: 1 %
FEV6-%PRED-POST: 94 %
FEV6-%Pred-Pre: 93 %
FEV6-POST: 3.76 L
FEV6-PRE: 3.7 L
FEV6FVC-%PRED-PRE: 101 %
FEV6FVC-%Pred-Post: 101 %
FVC-%CHANGE-POST: 1 %
FVC-%PRED-POST: 93 %
FVC-%PRED-PRE: 92 %
FVC-PRE: 3.7 L
FVC-Post: 3.76 L
POST FEV1/FVC RATIO: 78 %
PRE FEV6/FVC RATIO: 100 %
Post FEV6/FVC ratio: 100 %
Pre FEV1/FVC ratio: 76 %

## 2016-05-27 NOTE — Progress Notes (Signed)
Subjective:    Patient ID: [name], female    DOB: [birth year], 30 y.o.   MRN: [id number]  C.C.:  Follow-up for Severe, Persistent Asthma, Tobacco Use Disorder, Chronic Allergic Rhinitis, & GERD.  HPI  Patient planned for C-section next week.   Severe, Persistent Asthma:  Patient started on Qvar 80 g 2 puffs twice daily at last appointment. She reports she hasn't started taking Qvar. She is avoiding the burning she was exposed to before. She denies any coughing or dyspnea. No wheezing. Reports she is not waking up at night with any breathing problems. She hasn't needed her rescue inhaler at all per her report.   Tobacco Use Disorder:  Previously intolerant of Chantix. She is still smoking 1/2ppd to 1ppd. She has been trying to cut back and has replaced "smoking with sleeping".   Chronic Allergic Rhinitis: Serum RAST panel significant for profoundly elevated IgE and sensitivity to [place] and [place] grass.  She reports she has had continued sinus congestion & pressure.   GERD: Recommended to initiate treatment with Zantac as prescribed prior to last appointment. She reports previously has reflux was improved but has noticed more reflux during times of high stress. Does have occasional morning brash water taste. Using Tums or Rolaids in between. She is taking the Zantac twice daily.  Review of Systems No chest pain or pressure. No fever, chills, or sweats. Rare headaches and near syncope twice, once with standing and once with sitting.   Allergies  Allergen Reactions  . Bee Venom Anaphylaxis  . Ultram [Tramadol Hcl] Anaphylaxis and Hives  . Compazine [Prochlorperazine Edisylate] Other (See Comments)    Made patient very depressed  . Magnesium-Containing Compounds Hives  . Cymbalta [Duloxetine Hcl] Anxiety    Current Outpatient Prescriptions on File Prior to Visit  Medication Sig Dispense Refill  . ACCU-CHEK FASTCLIX LANCETS MISC 1 each by Percutaneous route 4 (four)  times daily. 100 each 12  . acetaminophen (TYLENOL) 500 MG tablet Take 1,000 mg by mouth every 6 (six) hours as needed for headache.    . albuterol (PROVENTIL HFA;VENTOLIN HFA) 108 (90 Base) MCG/ACT inhaler Inhale 2 puffs into the lungs every 4 (four) hours as needed for wheezing or shortness of breath. 1 Inhaler 2  . aspirin EC 81 MG tablet Take 1 tablet (81 mg total) by mouth daily. Take after 12 weeks for prevention of preeclampssia later in pregnancy 300 tablet 2  . beclomethasone (QVAR) 80 MCG/ACT inhaler Inhale 2 puffs into the lungs 2 (two) times daily. 1 Inhaler 3  . blood glucose meter kit and supplies Dispense based on patient and insurance preference. Check blood sugar four times daily as directed. (FOR ICD-9 250.00, 250.01). 1 each 0  . Calcium Carbonate-Simethicone (ROLAIDS MULTI-SYMPTOM PO) Take 2 tablets by mouth daily as needed (heart burn).    . cyanocobalamin (,VITAMIN B-12,) 1000 MCG/ML injection Inject 52m daily x 6 days, inject 172monce weekly x 4 weeks, then inject 63m6monthly x 10 months thereafter. 20 mL 0  . glucose blood test strip Use as instructed 4 times daily. 100 each 12  . guaiFENesin 200 MG tablet Take 1 tablet (200 mg total) by mouth every 4 (four) hours as needed for cough or to loosen phlegm. 30 suppository 0  . insulin aspart (NOVOLOG) 100 UNIT/ML injection Inject 10 Units into the skin 3 (three) times daily before meals. 10 mL 12  . insulin NPH Human (HUMULIN N,NOVOLIN N) 100 UNIT/ML injection 40 units with  breakfast and 20 units qhs 10 mL 3  . Insulin Syringe-Needle U-100 30G X 5/16" 0.5 ML MISC 1 Units by Does not apply route 6 (six) times daily. 100 each 3  . labetalol (NORMODYNE) 100 MG tablet Take 1 tablet (100 mg total) by mouth 2 (two) times daily. 60 tablet 3  . Miconazole Nitrate 2 % POWD 1 each by Does not apply route 2 (two) times daily as needed. 25 g 0  . oxyCODONE-acetaminophen (PERCOCET) 10-325 MG tablet Take 1 tablet by mouth every 6 (six) hours as  needed for pain. 60 tablet 0  . Peak Flow Meter DEVI 1 Units by Does not apply route 2 (two) times daily. 1 each 0  . Prenatal Multivit-Min-Fe-FA (PRENATAL VITAMINS) 0.8 MG tablet Take 1 tablet by mouth daily. 30 tablet 12  . ranitidine (ZANTAC) 150 MG tablet Take 1 tablet (150 mg total) by mouth 2 (two) times daily. 60 tablet 3   No current facility-administered medications on file prior to visit.     Past Medical History:  Diagnosis Date  . Allergic rhinitis   . Asthma    exercise induced  . Chronic back pain   . GERD (gastroesophageal reflux disease)   . Headache   . Headache(784.0)   . Kienbock's disease   . Morbid obesity ([place])   . Trichomonas     Past Surgical History:  Procedure Laterality Date  . CESAREAN SECTION    . CHOLECYSTECTOMY    . choley  gall bladder  . DILATION AND CURETTAGE OF UTERUS    . DILATION AND EVACUATION  05/21/2011   Procedure: DILATATION AND EVACUATION (D&E);  Surgeon: Florian Buff, MD;  Location: [facility];  Service: Gynecology;  Laterality: N/A;  . HERNIA REPAIR    . TOOTH EXTRACTION     17 teeth extracted  . TUBAL LIGATION     failed (2017 pregnancy)  . WRIST SURGERY Right 2009    Family History  Problem Relation Age of Onset  . Diabetes Mother   . COPD Mother   . Anxiety disorder Mother   . Hashimoto's thyroiditis Mother   . Hypertension Brother   . COPD Brother   . Depression Brother   . Colon cancer Brother   . Hashimoto's thyroiditis Maternal Aunt   . Anesthesia problems Neg Hx     Social History   Social History  . Marital status: Married    Spouse name: N/A  . Number of children: 4  . Years of education: GED   Occupational History  . Unemployed    Social History Main Topics  . Smoking status: Current Every Day Smoker    Packs/day: 1.00    Years: 23.00    Types: Cigarettes    Start date: 05/17/1993  . Smokeless tobacco: Never Used     Comment: Peak rate of 3ppd  . Alcohol use No  . Drug use: No  . Sexual  activity: Yes    Birth control/ protection: None     Comment: tubal in 2008   Other Topics Concern  . Not on file   Social History Narrative   Lives at home with husband and 3 of her 4 children (oldest daughter lives with her mother).   Right-handed.   12 cans of [employer] daily.      [place] [facility]:   Originally from [place], [place]. She has also lived in [place], [place], [place], [place], [place], [place], & [place]. Previously worked doing Ecologist and also as  a roofer. Has 2 dogs currently. Does have indoor plants. Previous home was condemned due to disrepair. No bird exposure.       Objective:   Physical Exam BP (!) 142/100 (BP Location: Left Arm, Cuff Size: Normal)   Pulse (!) 110   Ht 5' 6"  (1.676 m)   Wt 256 lb (116.1 kg)   LMP 09/24/2015   SpO2 99%   BMI 41.32 kg/m  General:  Awake. Alert. Obese/Gravid. Accompanied by husband today. Lymphatics:  No appreciated cervical or supraclavicular lymphadenoapthy. HEENT:  Moist mucus membranes. Minimal bilateral nasal turbinate swelling. No oral ulcers. Cardiovascular:  Regular rate and rhythm. No edema. Normal S1 & S2. Pulmonary:  Clear with auscultation bilaterally and good aeration. Normal work of breathing on room air. Abdomen: Soft. Protuberant. Gravid. Normal bowel sounds.  PFT 05/27/16: FVC 3.70 L (92%) FEV1 2.81 L (83%) FEV1/FVC 0.76 FEF 25-75 2.56 L (73%) negative bronchodilator response  IMAGING CXR PA/LAT 04/04/16 (previously reviewed by me): No focal opacity or nodule appreciated. No pleural effusion. Heart normal in size & mediastinum normal in contour.  LABS 04/18/16 RAST Panel:  [place] Grass 3.50 / [place] Grass 16.80 IgE:  1363    Assessment & Plan:  30 y.o. female  with Tobacco use disorder and severe, persistent asthma. She also has chronic allergic rhinitis & GERD. Patient is continuing to have intermittent symptoms from not only her allergic rhinitis but also her underlying reflux. She is continuing to smoke  tobacco but despite this her asthma seems to be well-controlled symptomatically. Her spirometry today shows no evidence of fixed airway obstruction or significant bronchodilator response. I reviewed her serum testing with her today which does show sensitivity to grasses with a significantly elevated IgE. I instructed the patient to contact my office if she had any new breathing problems or questions before next appointment.  1. Severe, Persistent Asthma:  Continuing to hold off on inhaled steroid therapy at this time. Recommended using albuterol inhaler as needed. Recommend switching to nebulizer medication with albuterol every 4 hours as needed for any coughing, wheezing, or shortness of breath around the time of delivery. 2. Tobacco Use Disorder:  Patient counseled for over 3 minutes and need for complete tobacco cessation. She is going to continue to wean herself off of cigarettes. 3. Chronic Allergic Rhinitis: Recommended using nasal saline rinse 1-2 times daily. Holding off on initiating medication therapy at this time given pregnancy. 4. GERD: Suboptimally controlled with twice daily Zantac. Hopefully this will improve post delivery. 5. Health Maintenance:  S/P Tdap October 2017 & Influenza Vaccine September 2017. 6. Follow-up: Patient to return to clinic in 2 months or sooner if needed.  Sonia Baller Ashok Cordia, M.D. [facility] Pager:  [phone] After 3pm or if no response, call [phone] 3:38 PM 05/27/16

## 2016-05-27 NOTE — Progress Notes (Signed)
Pre and Post Spiro completed today.[name],CMA  

## 2016-05-27 NOTE — Patient Instructions (Signed)
   You can hold off on using your Qvar inhaler for now.   Use your Albuterol inhaler as needed and if you are needing to use it to help with shortness of breath, wheezing, or coughing starting using your Qvar inhaler as prescribed.  I will see you back in a couple of months after your pregnancy. Call my office if you are having problems and need to be seen sooner.

## 2016-05-29 ENCOUNTER — Ambulatory Visit (INDEPENDENT_AMBULATORY_CARE_PROVIDER_SITE_OTHER): Payer: Medicaid Other | Admitting: Obstetrics and Gynecology

## 2016-05-29 ENCOUNTER — Encounter (HOSPITAL_COMMUNITY): Payer: Self-pay | Admitting: *Deleted

## 2016-05-29 ENCOUNTER — Ambulatory Visit: Payer: Self-pay

## 2016-05-29 VITALS — BP 142/87 | HR 92

## 2016-05-29 DIAGNOSIS — O09892 Supervision of other high risk pregnancies, second trimester: Secondary | ICD-10-CM

## 2016-05-29 DIAGNOSIS — O24419 Gestational diabetes mellitus in pregnancy, unspecified control: Secondary | ICD-10-CM | POA: Diagnosis not present

## 2016-05-29 DIAGNOSIS — O99113 Other diseases of the blood and blood-forming organs and certain disorders involving the immune mechanism complicating pregnancy, third trimester: Secondary | ICD-10-CM | POA: Diagnosis not present

## 2016-05-29 DIAGNOSIS — D696 Thrombocytopenia, unspecified: Secondary | ICD-10-CM

## 2016-05-29 DIAGNOSIS — F1190 Opioid use, unspecified, uncomplicated: Secondary | ICD-10-CM

## 2016-05-29 DIAGNOSIS — I10 Essential (primary) hypertension: Secondary | ICD-10-CM

## 2016-05-29 DIAGNOSIS — J45909 Unspecified asthma, uncomplicated: Secondary | ICD-10-CM

## 2016-05-29 DIAGNOSIS — O99513 Diseases of the respiratory system complicating pregnancy, third trimester: Secondary | ICD-10-CM

## 2016-05-29 DIAGNOSIS — O99519 Diseases of the respiratory system complicating pregnancy, unspecified trimester: Secondary | ICD-10-CM

## 2016-05-29 LAB — POCT URINALYSIS DIP (DEVICE)
BILIRUBIN URINE: NEGATIVE
GLUCOSE, UA: NEGATIVE mg/dL
Hgb urine dipstick: NEGATIVE
KETONES UR: NEGATIVE mg/dL
LEUKOCYTES UA: NEGATIVE
Nitrite: NEGATIVE
Protein, ur: 30 mg/dL — AB
SPECIFIC GRAVITY, URINE: 1.025 (ref 1.005–1.030)
Urobilinogen, UA: 0.2 mg/dL (ref 0.0–1.0)
pH: 7 (ref 5.0–8.0)

## 2016-05-29 NOTE — Progress Notes (Signed)
05/29/2016 NST reviewed and reactive

## 2016-05-29 NOTE — Progress Notes (Signed)
States not started insulin yet- pharmacy states has been ordered - should be able to start tomorrow. Needs education on insulin administration- states has been 9 years and she is nervous.

## 2016-05-29 NOTE — Progress Notes (Signed)
Insulin administration instructions reviewed.  Pt voiced understanding.

## 2016-05-30 ENCOUNTER — Telehealth: Payer: Self-pay

## 2016-05-30 LAB — PAIN MGMT, PROFILE 6 CONF W/O MM, U
6 ACETYLMORPHINE: NEGATIVE ng/mL (ref <10)
ALCOHOL METABOLITES: NEGATIVE ng/mL (ref <500)
Amphetamines: NEGATIVE ng/mL (ref <500)
BARBITURATES: NEGATIVE ng/mL (ref <300)
BENZODIAZEPINES: NEGATIVE ng/mL (ref <100)
CODEINE: NEGATIVE ng/mL (ref <50)
CREATININE: 160.6 mg/dL (ref >=20.0)
Cocaine Metabolite: NEGATIVE ng/mL (ref <150)
ETHYL GLUCURONIDE (ETG): 2901 ng/mL — AB (ref <500)
Ethyl Sulfate (ETS): NEGATIVE ng/mL (ref <100)
HYDROCODONE: NEGATIVE ng/mL (ref <50)
Hydromorphone: NEGATIVE ng/mL (ref <50)
MARIJUANA METABOLITE: NEGATIVE ng/mL (ref <20)
METHADONE METABOLITE: NEGATIVE ng/mL (ref <100)
Morphine: NEGATIVE ng/mL (ref <50)
NORHYDROCODONE: NEGATIVE ng/mL (ref <50)
Noroxycodone: 3437 ng/mL — ABNORMAL HIGH (ref <50)
OPIATES: NEGATIVE ng/mL (ref <100)
OXYCODONE: 1677 ng/mL — AB (ref <50)
OXYMORPHONE: 5521 ng/mL — AB (ref <50)
Oxidant: NEGATIVE ug/mL (ref <200)
Oxycodone: POSITIVE ng/mL — AB (ref <100)
PH: 7.79 (ref 4.5–9.0)
Phencyclidine: NEGATIVE ng/mL (ref <25)
Please note:: 0

## 2016-05-30 NOTE — Telephone Encounter (Signed)
Pt called and informed me that she took her fasting BS and it was 108.  She then said that she checked her BS and it was 231 and now she is feeling funny and fatigue.  She also informed me that her BS has never been in the 200's before.  I asked pt what she ate for breakfast she informed me that she ate a slice of pizza and a glass of sweet tea.  I informed pt that what she ate for breakfast has a lot of sugar and carbs which is the reason for her elevated BS.  I also informed her that the way she is feeling may be due to the fact that her BS has never been high like that before and its her bodies reaction to it. Pt stated that she wanted to stop taking the insulin if she is not taking it right because she does want her baby to die .  I strongly advised pt to not stop taking her insulin, to take as prescribed because that will help with managing her BS levels.  I informed pt that it is not because she is taking insulin incorrectly because she doing fine its the concern of her diet.  I informed pt that we need to get her in to see Nutrition so that she could get education on proper diet.  Pt stated that she will be able to come in on Monday for diet education at 0900.  Front office informed to schedule appt.

## 2016-06-02 ENCOUNTER — Other Ambulatory Visit (HOSPITAL_COMMUNITY)
Admission: RE | Admit: 2016-06-02 | Discharge: 2016-06-02 | Disposition: A | Discharge status: Home / Self Care | Payer: Medicaid Other | Source: Ambulatory Visit | Attending: Obstetrics & Gynecology | Admitting: Obstetrics & Gynecology

## 2016-06-02 ENCOUNTER — Other Ambulatory Visit: Payer: Self-pay

## 2016-06-02 ENCOUNTER — Encounter: Payer: Self-pay | Admitting: Obstetrics and Gynecology

## 2016-06-02 ENCOUNTER — Ambulatory Visit (INDEPENDENT_AMBULATORY_CARE_PROVIDER_SITE_OTHER): Payer: Medicaid Other | Admitting: Obstetrics & Gynecology

## 2016-06-02 VITALS — BP 149/95 | HR 95 | Wt 259.3 lb

## 2016-06-02 DIAGNOSIS — O24419 Gestational diabetes mellitus in pregnancy, unspecified control: Secondary | ICD-10-CM

## 2016-06-02 DIAGNOSIS — Z113 Encounter for screening for infections with a predominantly sexual mode of transmission: Secondary | ICD-10-CM | POA: Insufficient documentation

## 2016-06-02 DIAGNOSIS — O99313 Alcohol use complicating pregnancy, third trimester: Secondary | ICD-10-CM

## 2016-06-02 DIAGNOSIS — O10913 Unspecified pre-existing hypertension complicating pregnancy, third trimester: Secondary | ICD-10-CM

## 2016-06-02 DIAGNOSIS — F1010 Alcohol abuse, uncomplicated: Secondary | ICD-10-CM | POA: Insufficient documentation

## 2016-06-02 DIAGNOSIS — O09892 Supervision of other high risk pregnancies, second trimester: Secondary | ICD-10-CM

## 2016-06-02 LAB — POCT URINALYSIS DIP (DEVICE)
Bilirubin Urine: NEGATIVE
Glucose, UA: NEGATIVE mg/dL
HGB URINE DIPSTICK: NEGATIVE
Ketones, ur: NEGATIVE mg/dL
LEUKOCYTES UA: NEGATIVE
NITRITE: NEGATIVE
PH: 7 (ref 5.0–8.0)
Protein, ur: 30 mg/dL — AB
SPECIFIC GRAVITY, URINE: 1.025 (ref 1.005–1.030)
UROBILINOGEN UA: 0.2 mg/dL (ref 0.0–1.0)

## 2016-06-02 MED ORDER — LABETALOL HCL 200 MG PO TABS: 200.0000 mg | ORAL_TABLET | Freq: Two times a day (BID) | ORAL | 1 refills | Status: DC

## 2016-06-02 MED ORDER — PANTOPRAZOLE SODIUM 40 MG PO TBEC: 40.0000 mg | DELAYED_RELEASE_TABLET | Freq: Every day | ORAL | 1 refills | Status: DC

## 2016-06-02 NOTE — Progress Notes (Signed)
Pt states she is having increased amount of heartburn despite taking Zantac and Rolaids. US for growth and BPP scheduled 11/24.
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
## 2016-06-03 LAB — GC/CHLAMYDIA PROBE AMP (~~LOC~~) NOT AT ARMC
CHLAMYDIA, DNA PROBE: NEGATIVE
Neisseria Gonorrhea: NEGATIVE

## 2016-06-04 LAB — CULTURE, BETA STREP (GROUP B ONLY)

## 2016-06-06 ENCOUNTER — Encounter (HOSPITAL_COMMUNITY): Payer: Self-pay

## 2016-06-06 ENCOUNTER — Ambulatory Visit (HOSPITAL_COMMUNITY)
Admission: RE | Admit: 2016-06-06 | Discharge: 2016-06-06 | Disposition: A | Discharge status: Home / Self Care | Payer: Medicaid Other | Source: Ambulatory Visit | Attending: Family | Admitting: Family

## 2016-06-06 DIAGNOSIS — O34219 Maternal care for unspecified type scar from previous cesarean delivery: Secondary | ICD-10-CM | POA: Diagnosis not present

## 2016-06-06 DIAGNOSIS — R760 Raised antibody titer: Secondary | ICD-10-CM

## 2016-06-06 DIAGNOSIS — Z6791 Unspecified blood type, Rh negative: Secondary | ICD-10-CM

## 2016-06-06 DIAGNOSIS — Z3A36 36 weeks gestation of pregnancy: Secondary | ICD-10-CM | POA: Insufficient documentation

## 2016-06-06 DIAGNOSIS — O99323 Drug use complicating pregnancy, third trimester: Secondary | ICD-10-CM | POA: Insufficient documentation

## 2016-06-06 DIAGNOSIS — O99333 Smoking (tobacco) complicating pregnancy, third trimester: Secondary | ICD-10-CM | POA: Diagnosis not present

## 2016-06-06 DIAGNOSIS — O289 Unspecified abnormal findings on antenatal screening of mother: Secondary | ICD-10-CM | POA: Diagnosis not present

## 2016-06-06 DIAGNOSIS — O10913 Unspecified pre-existing hypertension complicating pregnancy, third trimester: Secondary | ICD-10-CM

## 2016-06-06 DIAGNOSIS — O24414 Gestational diabetes mellitus in pregnancy, insulin controlled: Secondary | ICD-10-CM | POA: Diagnosis not present

## 2016-06-06 DIAGNOSIS — O24419 Gestational diabetes mellitus in pregnancy, unspecified control: Secondary | ICD-10-CM | POA: Diagnosis present

## 2016-06-06 DIAGNOSIS — O99213 Obesity complicating pregnancy, third trimester: Secondary | ICD-10-CM | POA: Diagnosis not present

## 2016-06-06 DIAGNOSIS — O09892 Supervision of other high risk pregnancies, second trimester: Secondary | ICD-10-CM

## 2016-06-06 DIAGNOSIS — O280 Abnormal hematological finding on antenatal screening of mother: Secondary | ICD-10-CM

## 2016-06-06 DIAGNOSIS — O26893 Other specified pregnancy related conditions, third trimester: Secondary | ICD-10-CM

## 2016-06-06 DIAGNOSIS — O10013 Pre-existing essential hypertension complicating pregnancy, third trimester: Secondary | ICD-10-CM | POA: Insufficient documentation

## 2016-06-09 ENCOUNTER — Other Ambulatory Visit: Payer: Self-pay | Admitting: Family Medicine

## 2016-06-20 ENCOUNTER — Encounter (HOSPITAL_COMMUNITY): Admission: RE | Admit: 2016-06-20 | Payer: Medicaid Other | Source: Ambulatory Visit

## 2016-06-23 ENCOUNTER — Encounter (HOSPITAL_COMMUNITY): Admission: RE | Payer: Self-pay | Source: Ambulatory Visit

## 2016-06-23 ENCOUNTER — Inpatient Hospital Stay (HOSPITAL_COMMUNITY): Admission: RE | Admit: 2016-06-23 | Payer: Medicaid Other | Source: Ambulatory Visit | Admitting: Family Medicine

## 2016-06-23 SURGERY — Surgical Case
Anesthesia: Regional | Site: Abdomen

## 2016-07-28 ENCOUNTER — Ambulatory Visit: Payer: Self-pay | Admitting: Student

## 2016-07-28 ENCOUNTER — Encounter: Payer: Self-pay | Admitting: *Deleted

## 2016-07-28 ENCOUNTER — Telehealth: Payer: Self-pay | Admitting: *Deleted

## 2016-07-28 NOTE — Telephone Encounter (Signed)
Hisae missed postpartum appt needs 2 hr gtt.called, left message. Sent letter.

## 2016-07-30 ENCOUNTER — Ambulatory Visit: Payer: Self-pay | Admitting: Pulmonary Disease

## 2016-12-06 ENCOUNTER — Encounter (HOSPITAL_COMMUNITY): Payer: Self-pay

## 2017-04-16 ENCOUNTER — Encounter: Payer: Self-pay | Admitting: *Deleted

## 2017-08-10 ENCOUNTER — Emergency Department (HOSPITAL_BASED_OUTPATIENT_CLINIC_OR_DEPARTMENT_OTHER): Payer: Medicaid Other

## 2017-08-10 ENCOUNTER — Emergency Department (HOSPITAL_BASED_OUTPATIENT_CLINIC_OR_DEPARTMENT_OTHER)
Admission: EM | Admit: 2017-08-10 | Discharge: 2017-08-10 | Disposition: A | Discharge status: Home / Self Care | Payer: Medicaid Other | Attending: Emergency Medicine | Admitting: Emergency Medicine

## 2017-08-10 ENCOUNTER — Other Ambulatory Visit: Payer: Self-pay

## 2017-08-10 ENCOUNTER — Encounter (HOSPITAL_BASED_OUTPATIENT_CLINIC_OR_DEPARTMENT_OTHER): Payer: Self-pay | Admitting: *Deleted

## 2017-08-10 DIAGNOSIS — Z794 Long term (current) use of insulin: Secondary | ICD-10-CM | POA: Insufficient documentation

## 2017-08-10 DIAGNOSIS — J189 Pneumonia, unspecified organism: Secondary | ICD-10-CM

## 2017-08-10 DIAGNOSIS — I10 Essential (primary) hypertension: Secondary | ICD-10-CM | POA: Diagnosis not present

## 2017-08-10 DIAGNOSIS — J101 Influenza due to other identified influenza virus with other respiratory manifestations: Secondary | ICD-10-CM

## 2017-08-10 DIAGNOSIS — R0902 Hypoxemia: Secondary | ICD-10-CM | POA: Diagnosis not present

## 2017-08-10 DIAGNOSIS — F17210 Nicotine dependence, cigarettes, uncomplicated: Secondary | ICD-10-CM | POA: Insufficient documentation

## 2017-08-10 DIAGNOSIS — J45909 Unspecified asthma, uncomplicated: Secondary | ICD-10-CM | POA: Diagnosis not present

## 2017-08-10 DIAGNOSIS — Z79899 Other long term (current) drug therapy: Secondary | ICD-10-CM | POA: Insufficient documentation

## 2017-08-10 DIAGNOSIS — Z7982 Long term (current) use of aspirin: Secondary | ICD-10-CM | POA: Insufficient documentation

## 2017-08-10 DIAGNOSIS — J449 Chronic obstructive pulmonary disease, unspecified: Secondary | ICD-10-CM | POA: Insufficient documentation

## 2017-08-10 DIAGNOSIS — R05 Cough: Secondary | ICD-10-CM | POA: Diagnosis present

## 2017-08-10 HISTORY — DX: Chronic obstructive pulmonary disease, unspecified: J44.9

## 2017-08-10 HISTORY — DX: Anemia, unspecified: D64.9

## 2017-08-10 HISTORY — DX: Migraine, unspecified, not intractable, without status migrainosus: G43.909

## 2017-08-10 HISTORY — DX: Essential (primary) hypertension: I10

## 2017-08-10 LAB — COMPREHENSIVE METABOLIC PANEL
ALT: 31 U/L (ref 14–54)
ANION GAP: 9 (ref 5–15)
AST: 37 U/L (ref 15–41)
Albumin: 3.3 g/dL — ABNORMAL LOW (ref 3.5–5.0)
Alkaline Phosphatase: 69 U/L (ref 38–126)
BUN: 6 mg/dL (ref 6–20)
CHLORIDE: 102 mmol/L (ref 101–111)
CO2: 26 mmol/L (ref 22–32)
CREATININE: 0.65 mg/dL (ref 0.44–1.00)
Calcium: 8.4 mg/dL — ABNORMAL LOW (ref 8.9–10.3)
GFR calc Af Amer: >60 mL/min (ref >60)
Glucose, Bld: 102 mg/dL — ABNORMAL HIGH (ref 65–99)
POTASSIUM: 3.7 mmol/L (ref 3.5–5.1)
SODIUM: 137 mmol/L (ref 135–145)
Total Bilirubin: 0.7 mg/dL (ref 0.3–1.2)
Total Protein: 7.7 g/dL (ref 6.5–8.1)

## 2017-08-10 LAB — TROPONIN I: Troponin I: <0.03 ng/mL (ref <0.03)

## 2017-08-10 LAB — CBC WITH DIFFERENTIAL/PLATELET
Basophils Absolute: 0 10*3/uL (ref 0.0–0.1)
Basophils Relative: 0 %
EOS ABS: 0.1 10*3/uL (ref 0.0–0.7)
EOS PCT: 1 %
HCT: 37.8 % (ref 36.0–46.0)
Hemoglobin: 12.9 g/dL (ref 12.0–15.0)
LYMPHS ABS: 1.9 10*3/uL (ref 0.7–4.0)
LYMPHS PCT: 30 %
MCH: 29.3 pg (ref 26.0–34.0)
MCHC: 34.1 g/dL (ref 30.0–36.0)
MCV: 85.7 fL (ref 78.0–100.0)
MONO ABS: 0.6 10*3/uL (ref 0.1–1.0)
Monocytes Relative: 9 %
Neutro Abs: 3.7 10*3/uL (ref 1.7–7.7)
Neutrophils Relative %: 60 %
PLATELETS: 120 10*3/uL — AB (ref 150–400)
RBC: 4.41 MIL/uL (ref 3.87–5.11)
RDW: 12.7 % (ref 11.5–15.5)
WBC: 6.2 10*3/uL (ref 4.0–10.5)

## 2017-08-10 LAB — INFLUENZA PANEL BY PCR (TYPE A & B)
INFLBPCR: NEGATIVE
Influenza A By PCR: POSITIVE — AB

## 2017-08-10 LAB — I-STAT CG4 LACTIC ACID, ED: LACTIC ACID, VENOUS: 0.73 mmol/L (ref 0.5–1.9)

## 2017-08-10 MED ORDER — DEXAMETHASONE SODIUM PHOSPHATE 10 MG/ML IJ SOLN
10.0000 mg | Freq: Once | INTRAMUSCULAR | Status: AC
  Administered 2017-08-10: 10 mg via INTRAVENOUS
  Filled 2017-08-10: qty 1

## 2017-08-10 MED ORDER — OSELTAMIVIR PHOSPHATE 75 MG PO CAPS: 75.0000 mg | ORAL_CAPSULE | Freq: Two times a day (BID) | ORAL | 0 refills | Status: DC

## 2017-08-10 MED ORDER — OSELTAMIVIR PHOSPHATE 75 MG PO CAPS
75.0000 mg | ORAL_CAPSULE | Freq: Once | ORAL | Status: AC
  Administered 2017-08-10: 75 mg via ORAL
  Filled 2017-08-10: qty 1

## 2017-08-10 MED ORDER — ALBUTEROL SULFATE HFA 108 (90 BASE) MCG/ACT IN AERS
2.0000 | INHALATION_SPRAY | RESPIRATORY_TRACT | Status: DC | PRN
  Administered 2017-08-10: 2 via RESPIRATORY_TRACT
  Filled 2017-08-10: qty 6.7

## 2017-08-10 MED ORDER — SODIUM CHLORIDE 0.9 % IV BOLUS (SEPSIS)
1000.0000 mL | Freq: Once | INTRAVENOUS | Status: AC
Start: 2017-08-10 — End: 2017-08-10
  Administered 2017-08-10: 1000 mL via INTRAVENOUS

## 2017-08-10 MED ORDER — AEROCHAMBER PLUS FLO-VU MEDIUM MISC
1.0000 | Freq: Once | Status: AC
  Administered 2017-08-10: 1
  Filled 2017-08-10: qty 1

## 2017-08-10 MED ORDER — LEVOFLOXACIN 500 MG PO TABS: 500.0000 mg | ORAL_TABLET | Freq: Every day | ORAL | 0 refills | Status: DC

## 2017-08-10 MED ORDER — IPRATROPIUM-ALBUTEROL 0.5-2.5 (3) MG/3ML IN SOLN
3.0000 mL | Freq: Four times a day (QID) | RESPIRATORY_TRACT | Status: DC
  Administered 2017-08-10: 3 mL via RESPIRATORY_TRACT
  Filled 2017-08-10: qty 3

## 2017-08-10 MED ORDER — LEVOFLOXACIN IN D5W 500 MG/100ML IV SOLN
500.0000 mg | Freq: Once | INTRAVENOUS | Status: AC
  Administered 2017-08-10: 500 mg via INTRAVENOUS
  Filled 2017-08-10: qty 100

## 2017-08-10 MED ORDER — IPRATROPIUM-ALBUTEROL 0.5-2.5 (3) MG/3ML IN SOLN
3.0000 mL | Freq: Once | RESPIRATORY_TRACT | Status: AC
  Administered 2017-08-10: 3 mL via RESPIRATORY_TRACT
  Filled 2017-08-10: qty 3

## 2017-08-10 MED FILL — levoFLOXacin 500 MG TABS: 500 | 7 days supply | Qty: 7 | Fill #0

## 2017-08-10 MED FILL — TAMIFLU 75 MG GELCAP: 75 | 5 days supply | Qty: 10 | Fill #0

## 2017-08-10 NOTE — ED Notes (Addendum)
Following 2nd HHN patient ambulated to restroom then room 1, on Room air SpO2 90-94%, HR 100-110, RR 26.

## 2017-08-10 NOTE — ED Provider Notes (Signed)
I received this patient in signout from Dr. Wilkie AyeHorton.  Her workup was notable for chest x-ray showing pneumonia in the setting of 1 week of flu illness.  She had already received antibiotics, Tamiflu, and steroids/albuterol for wheezing given she is a heavy smoker.  Her lab work has been reassuring.  On reassessment, her work of breathing was appropriate and I was able to turn off supplemental oxygen. She ambulated independently with sats staying 92-93%, no desaturations or severe respiratory distress.  She has continued to remain stable off oxygen therefore I feel she is appropriate for discharge, especially given that her husband will need admission for his pneumonia and they have a child with no other family available to care for child if both parents were hospitalized.  He feels comfortable with this plan.  Have discussed supportive measures, antibiotics, and f/u with PCP in 2-3 days.  Extensively reviewed return precautions.   Little, Ambrose Finlandachel Morgan, MD 08/10/17 1055

## 2017-08-10 NOTE — ED Notes (Addendum)
EDP into room, prior to triage or RN assessment, see MD notes, pending orders. RT at BS  

## 2017-08-10 NOTE — Discharge Instructions (Signed)
USE INHALER 2 PUFFS EVERY 4-6 HOURS FOR THE NEXT 1-2 DAYS THEN SPACE OUT AS NEEDED FOR WHEEZING. TAKE ALL MEDICATIONS AS PRESCRIBED UNTIL FINISHED. FOLLOW UP WITH PRIMARY CARE PROVIDER IN 2-3 DAYS. RETURN TO ER IF YOUR BREATHING WORSENS OR YOU HAVE VOMITING, WORSENING FEVERS, OR ANY SUDDEN CHANGE IN SYMPTOMS.
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
## 2017-08-10 NOTE — ED Triage Notes (Addendum)
Here for sob, cough, dizziness, HA, eyes hurt, chest tight, chills, subjective fever, and R axilla pain. Onset 6 days ago. No relief with nyqyuil, tylenol or cultural/hispanic mixed cold med. (Denies: NVD). Reports sick family contacts. Here in room with sick child as well.  Alert, NAD, calm, interactive, resps e/mildly labored, mild increased wob, speaking in clear shortened phrases, skin W&D, VSS. Smoker. No flu vaccine this season. PCP Sammy Roseanne RenoHassan. H/o asthma, HTN, "and maybe COPD".

## 2017-08-15 LAB — CULTURE, BLOOD (ROUTINE X 2)
CULTURE: NO GROWTH
Culture: NO GROWTH
SPECIAL REQUESTS: ADEQUATE
Special Requests: ADEQUATE

## 2018-04-08 ENCOUNTER — Encounter: Payer: Self-pay | Admitting: *Deleted

## 2018-07-21 ENCOUNTER — Emergency Department (HOSPITAL_COMMUNITY)
Admission: EM | Admit: 2018-07-21 | Discharge: 2018-07-22 | Disposition: A | Discharge status: Home / Self Care | Payer: Medicaid Other | Attending: Emergency Medicine | Admitting: Emergency Medicine

## 2018-07-21 ENCOUNTER — Encounter (HOSPITAL_COMMUNITY): Payer: Self-pay

## 2018-07-21 ENCOUNTER — Emergency Department (HOSPITAL_COMMUNITY): Payer: Medicaid Other

## 2018-07-21 DIAGNOSIS — J449 Chronic obstructive pulmonary disease, unspecified: Secondary | ICD-10-CM | POA: Insufficient documentation

## 2018-07-21 DIAGNOSIS — Z79899 Other long term (current) drug therapy: Secondary | ICD-10-CM | POA: Diagnosis not present

## 2018-07-21 DIAGNOSIS — I10 Essential (primary) hypertension: Secondary | ICD-10-CM | POA: Diagnosis not present

## 2018-07-21 DIAGNOSIS — F17210 Nicotine dependence, cigarettes, uncomplicated: Secondary | ICD-10-CM | POA: Diagnosis not present

## 2018-07-21 DIAGNOSIS — G5602 Carpal tunnel syndrome, left upper limb: Secondary | ICD-10-CM | POA: Diagnosis not present

## 2018-07-21 DIAGNOSIS — Z794 Long term (current) use of insulin: Secondary | ICD-10-CM | POA: Diagnosis not present

## 2018-07-21 DIAGNOSIS — R52 Pain, unspecified: Secondary | ICD-10-CM

## 2018-07-21 DIAGNOSIS — M79642 Pain in left hand: Secondary | ICD-10-CM | POA: Diagnosis present

## 2018-07-21 NOTE — ED Triage Notes (Signed)
Pt reports left hand pain, hx of carpel tunnel and surgery in the right hand. Pt tried ice, heat and pain medication at home without relief. No swelling noted.

## 2018-07-22 NOTE — ED Notes (Signed)
Placed arm sling, educated.  Pt understands discharge instructions and follow up instructions.

## 2018-07-22 NOTE — Discharge Instructions (Addendum)
General instructions Take over-the-counter and prescription medicines only as told by your health care provider. Rest your wrist from any activity that may be causing your pain. If your condition is work related, talk with your employer about changes that can be made, such as getting a wrist pad to use while typing. Do any exercises as told by your health care provider, physical therapist, or occupational therapist. Keep all follow-up visits as told by your health care provider. This is important.
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
## 2020-11-22 ENCOUNTER — Inpatient Hospital Stay (HOSPITAL_COMMUNITY)
Admission: EM | Admit: 2020-11-22 | Discharge: 2020-11-25 | DRG: 300 | Disposition: A | Discharge status: Home / Self Care | Payer: Medicaid Other | Attending: Obstetrics and Gynecology | Admitting: Obstetrics and Gynecology

## 2020-11-22 DIAGNOSIS — G43909 Migraine, unspecified, not intractable, without status migrainosus: Secondary | ICD-10-CM | POA: Diagnosis present

## 2020-11-22 DIAGNOSIS — Z800 Family history of malignant neoplasm of digestive organs: Secondary | ICD-10-CM

## 2020-11-22 DIAGNOSIS — Z6841 Body Mass Index (BMI) 40.0 and over, adult: Secondary | ICD-10-CM

## 2020-11-22 DIAGNOSIS — I809 Phlebitis and thrombophlebitis of unspecified site: Secondary | ICD-10-CM | POA: Diagnosis present

## 2020-11-22 DIAGNOSIS — Z885 Allergy status to narcotic agent status: Secondary | ICD-10-CM

## 2020-11-22 DIAGNOSIS — I10 Essential (primary) hypertension: Secondary | ICD-10-CM | POA: Diagnosis present

## 2020-11-22 DIAGNOSIS — Z8249 Family history of ischemic heart disease and other diseases of the circulatory system: Secondary | ICD-10-CM

## 2020-11-22 DIAGNOSIS — Z20822 Contact with and (suspected) exposure to covid-19: Secondary | ICD-10-CM | POA: Diagnosis present

## 2020-11-22 DIAGNOSIS — K219 Gastro-esophageal reflux disease without esophagitis: Secondary | ICD-10-CM | POA: Diagnosis present

## 2020-11-22 DIAGNOSIS — Z888 Allergy status to other drugs, medicaments and biological substances status: Secondary | ICD-10-CM

## 2020-11-22 DIAGNOSIS — G8929 Other chronic pain: Secondary | ICD-10-CM | POA: Diagnosis present

## 2020-11-22 DIAGNOSIS — R1031 Right lower quadrant pain: Secondary | ICD-10-CM

## 2020-11-22 DIAGNOSIS — I808 Phlebitis and thrombophlebitis of other sites: Principal | ICD-10-CM | POA: Diagnosis present

## 2020-11-22 DIAGNOSIS — Z833 Family history of diabetes mellitus: Secondary | ICD-10-CM

## 2020-11-22 DIAGNOSIS — Z794 Long term (current) use of insulin: Secondary | ICD-10-CM

## 2020-11-22 DIAGNOSIS — J449 Chronic obstructive pulmonary disease, unspecified: Secondary | ICD-10-CM | POA: Diagnosis present

## 2020-11-22 DIAGNOSIS — Z91030 Bee allergy status: Secondary | ICD-10-CM

## 2020-11-22 DIAGNOSIS — Z825 Family history of asthma and other chronic lower respiratory diseases: Secondary | ICD-10-CM

## 2020-11-22 DIAGNOSIS — Z79899 Other long term (current) drug therapy: Secondary | ICD-10-CM

## 2020-11-22 DIAGNOSIS — M549 Dorsalgia, unspecified: Secondary | ICD-10-CM | POA: Diagnosis present

## 2020-11-22 DIAGNOSIS — Z7982 Long term (current) use of aspirin: Secondary | ICD-10-CM

## 2020-11-22 DIAGNOSIS — F17210 Nicotine dependence, cigarettes, uncomplicated: Secondary | ICD-10-CM | POA: Diagnosis present

## 2020-11-22 DIAGNOSIS — M92219 Osteochondrosis (juvenile) of carpal lunate [Kienbock], unspecified hand: Secondary | ICD-10-CM | POA: Diagnosis present

## 2020-11-22 DIAGNOSIS — Z818 Family history of other mental and behavioral disorders: Secondary | ICD-10-CM

## 2020-11-22 DIAGNOSIS — E669 Obesity, unspecified: Secondary | ICD-10-CM | POA: Diagnosis present

## 2020-11-23 ENCOUNTER — Emergency Department (HOSPITAL_COMMUNITY): Payer: Medicaid Other

## 2020-11-23 ENCOUNTER — Other Ambulatory Visit: Payer: Self-pay

## 2020-11-23 ENCOUNTER — Encounter (HOSPITAL_COMMUNITY): Payer: Self-pay | Admitting: Student

## 2020-11-23 DIAGNOSIS — F17210 Nicotine dependence, cigarettes, uncomplicated: Secondary | ICD-10-CM | POA: Diagnosis present

## 2020-11-23 DIAGNOSIS — Z6841 Body Mass Index (BMI) 40.0 and over, adult: Secondary | ICD-10-CM | POA: Diagnosis not present

## 2020-11-23 DIAGNOSIS — Z7982 Long term (current) use of aspirin: Secondary | ICD-10-CM | POA: Diagnosis not present

## 2020-11-23 DIAGNOSIS — Z8249 Family history of ischemic heart disease and other diseases of the circulatory system: Secondary | ICD-10-CM | POA: Diagnosis not present

## 2020-11-23 DIAGNOSIS — Z825 Family history of asthma and other chronic lower respiratory diseases: Secondary | ICD-10-CM | POA: Diagnosis not present

## 2020-11-23 DIAGNOSIS — Z888 Allergy status to other drugs, medicaments and biological substances status: Secondary | ICD-10-CM | POA: Diagnosis not present

## 2020-11-23 DIAGNOSIS — Z20822 Contact with and (suspected) exposure to covid-19: Secondary | ICD-10-CM | POA: Diagnosis present

## 2020-11-23 DIAGNOSIS — Z91030 Bee allergy status: Secondary | ICD-10-CM | POA: Diagnosis not present

## 2020-11-23 DIAGNOSIS — I10 Essential (primary) hypertension: Secondary | ICD-10-CM | POA: Diagnosis present

## 2020-11-23 DIAGNOSIS — K219 Gastro-esophageal reflux disease without esophagitis: Secondary | ICD-10-CM | POA: Diagnosis present

## 2020-11-23 DIAGNOSIS — J449 Chronic obstructive pulmonary disease, unspecified: Secondary | ICD-10-CM | POA: Diagnosis present

## 2020-11-23 DIAGNOSIS — E669 Obesity, unspecified: Secondary | ICD-10-CM | POA: Diagnosis present

## 2020-11-23 DIAGNOSIS — Z800 Family history of malignant neoplasm of digestive organs: Secondary | ICD-10-CM | POA: Diagnosis not present

## 2020-11-23 DIAGNOSIS — M549 Dorsalgia, unspecified: Secondary | ICD-10-CM | POA: Diagnosis present

## 2020-11-23 DIAGNOSIS — R1031 Right lower quadrant pain: Secondary | ICD-10-CM | POA: Diagnosis present

## 2020-11-23 DIAGNOSIS — Z79899 Other long term (current) drug therapy: Secondary | ICD-10-CM | POA: Diagnosis not present

## 2020-11-23 DIAGNOSIS — Z794 Long term (current) use of insulin: Secondary | ICD-10-CM | POA: Diagnosis not present

## 2020-11-23 DIAGNOSIS — Z818 Family history of other mental and behavioral disorders: Secondary | ICD-10-CM | POA: Diagnosis not present

## 2020-11-23 DIAGNOSIS — M92219 Osteochondrosis (juvenile) of carpal lunate [Kienbock], unspecified hand: Secondary | ICD-10-CM | POA: Diagnosis present

## 2020-11-23 DIAGNOSIS — G43909 Migraine, unspecified, not intractable, without status migrainosus: Secondary | ICD-10-CM | POA: Diagnosis present

## 2020-11-23 DIAGNOSIS — Z833 Family history of diabetes mellitus: Secondary | ICD-10-CM | POA: Diagnosis not present

## 2020-11-23 DIAGNOSIS — Z885 Allergy status to narcotic agent status: Secondary | ICD-10-CM | POA: Diagnosis not present

## 2020-11-23 DIAGNOSIS — I808 Phlebitis and thrombophlebitis of other sites: Secondary | ICD-10-CM | POA: Diagnosis present

## 2020-11-23 DIAGNOSIS — I809 Phlebitis and thrombophlebitis of unspecified site: Secondary | ICD-10-CM | POA: Diagnosis present

## 2020-11-23 DIAGNOSIS — G8929 Other chronic pain: Secondary | ICD-10-CM | POA: Diagnosis present

## 2020-11-23 LAB — WET PREP, GENITAL
Clue Cells Wet Prep HPF POC: NONE SEEN
Sperm: NONE SEEN
Trich, Wet Prep: NONE SEEN
Yeast Wet Prep HPF POC: NONE SEEN

## 2020-11-23 LAB — COMPREHENSIVE METABOLIC PANEL
ALT: 33 U/L (ref 0–44)
AST: 25 U/L (ref 15–41)
Albumin: 3.3 g/dL — ABNORMAL LOW (ref 3.5–5.0)
Alkaline Phosphatase: 98 U/L (ref 38–126)
Anion gap: 8 (ref 5–15)
BUN: <5 mg/dL — ABNORMAL LOW (ref 6–20)
CO2: 28 mmol/L (ref 22–32)
Calcium: 9.2 mg/dL (ref 8.9–10.3)
Chloride: 100 mmol/L (ref 98–111)
Creatinine, Ser: 0.71 mg/dL (ref 0.44–1.00)
GFR, Estimated: >60 mL/min (ref >60)
Glucose, Bld: 156 mg/dL — ABNORMAL HIGH (ref 70–99)
Potassium: 3.4 mmol/L — ABNORMAL LOW (ref 3.5–5.1)
Sodium: 136 mmol/L (ref 135–145)
Total Bilirubin: 0.7 mg/dL (ref 0.3–1.2)
Total Protein: 7.6 g/dL (ref 6.5–8.1)

## 2020-11-23 LAB — CBC WITH DIFFERENTIAL/PLATELET
Abs Immature Granulocytes: 0.09 10*3/uL — ABNORMAL HIGH (ref 0.00–0.07)
Basophils Absolute: 0.1 10*3/uL (ref 0.0–0.1)
Basophils Relative: 0 %
Eosinophils Absolute: 0.4 10*3/uL (ref 0.0–0.5)
Eosinophils Relative: 2 %
HCT: 44.4 % (ref 36.0–46.0)
Hemoglobin: 14.4 g/dL (ref 12.0–15.0)
Immature Granulocytes: 1 %
Lymphocytes Relative: 18 %
Lymphs Abs: 3.7 10*3/uL (ref 0.7–4.0)
MCH: 28.4 pg (ref 26.0–34.0)
MCHC: 32.4 g/dL (ref 30.0–36.0)
MCV: 87.6 fL (ref 80.0–100.0)
Monocytes Absolute: 0.9 10*3/uL (ref 0.1–1.0)
Monocytes Relative: 4 %
Neutro Abs: 14.7 10*3/uL — ABNORMAL HIGH (ref 1.7–7.7)
Neutrophils Relative %: 75 %
Platelets: 193 10*3/uL (ref 150–400)
RBC: 5.07 MIL/uL (ref 3.87–5.11)
RDW: 13.1 % (ref 11.5–15.5)
WBC: 19.8 10*3/uL — ABNORMAL HIGH (ref 4.0–10.5)
nRBC: 0 % (ref 0.0–0.2)

## 2020-11-23 LAB — URINALYSIS, ROUTINE W REFLEX MICROSCOPIC
Bacteria, UA: NONE SEEN
Bilirubin Urine: NEGATIVE
Glucose, UA: NEGATIVE mg/dL
Hgb urine dipstick: NEGATIVE
Ketones, ur: NEGATIVE mg/dL
Leukocytes,Ua: NEGATIVE
Nitrite: NEGATIVE
Protein, ur: 30 mg/dL — AB
Specific Gravity, Urine: 1.023 (ref 1.005–1.030)
pH: 6 (ref 5.0–8.0)

## 2020-11-23 LAB — SARS CORONAVIRUS 2 (TAT 6-24 HRS): SARS Coronavirus 2: NEGATIVE

## 2020-11-23 LAB — I-STAT BETA HCG BLOOD, ED (MC, WL, AP ONLY): I-stat hCG, quantitative: 20.3 m[IU]/mL — ABNORMAL HIGH (ref <5)

## 2020-11-23 LAB — PREGNANCY, URINE: Preg Test, Ur: NEGATIVE

## 2020-11-23 LAB — LIPASE, BLOOD: Lipase: 25 U/L (ref 11–51)

## 2020-11-23 MED ORDER — PIPERACILLIN-TAZOBACTAM 3.375 G IVPB
3.3750 g | Freq: Three times a day (TID) | INTRAVENOUS | Status: DC
  Administered 2020-11-23 – 2020-11-25 (×8): 3.375 g via INTRAVENOUS
  Filled 2020-11-23 (×9): qty 50

## 2020-11-23 MED ORDER — SIMETHICONE 80 MG PO CHEW
80.0000 mg | CHEWABLE_TABLET | Freq: Four times a day (QID) | ORAL | Status: DC | PRN
  Administered 2020-11-24: 80 mg via ORAL
  Filled 2020-11-23: qty 1

## 2020-11-23 MED ORDER — ONDANSETRON HCL 4 MG/2ML IJ SOLN
4.0000 mg | Freq: Once | INTRAMUSCULAR | Status: AC
  Administered 2020-11-23: 4 mg via INTRAVENOUS
  Filled 2020-11-23: qty 2

## 2020-11-23 MED ORDER — OXYCODONE-ACETAMINOPHEN 5-325 MG PO TABS
1.0000 | ORAL_TABLET | ORAL | Status: DC | PRN
  Administered 2020-11-24 – 2020-11-25 (×6): 1 via ORAL
  Filled 2020-11-23 (×7): qty 1

## 2020-11-23 MED ORDER — SODIUM CHLORIDE 0.9% FLUSH
3.0000 mL | Freq: Two times a day (BID) | INTRAVENOUS | Status: DC
  Administered 2020-11-23 – 2020-11-25 (×4): 3 mL via INTRAVENOUS

## 2020-11-23 MED ORDER — ZOLPIDEM TARTRATE 5 MG PO TABS
5.0000 mg | ORAL_TABLET | Freq: Every evening | ORAL | Status: DC | PRN
  Filled 2020-11-23: qty 1

## 2020-11-23 MED ORDER — SENNA 8.6 MG PO TABS
1.0000 | ORAL_TABLET | Freq: Two times a day (BID) | ORAL | Status: DC
  Administered 2020-11-23 – 2020-11-25 (×5): 8.6 mg via ORAL
  Filled 2020-11-23 (×5): qty 1

## 2020-11-23 MED ORDER — HYDROMORPHONE HCL 1 MG/ML IJ SOLN
1.0000 mg | Freq: Once | INTRAMUSCULAR | Status: AC
  Administered 2020-11-23: 1 mg via INTRAVENOUS
  Filled 2020-11-23: qty 1

## 2020-11-23 MED ORDER — SODIUM CHLORIDE 0.9 % IV SOLN: 250.0000 mL | INTRAVENOUS | Status: DC | PRN

## 2020-11-23 MED ORDER — NIFEDIPINE ER OSMOTIC RELEASE 30 MG PO TB24
30.0000 mg | ORAL_TABLET | Freq: Every day | ORAL | Status: DC
  Administered 2020-11-23 – 2020-11-25 (×3): 30 mg via ORAL
  Filled 2020-11-23 (×4): qty 1

## 2020-11-23 MED ORDER — METRONIDAZOLE 500 MG/100ML IV SOLN
500.0000 mg | Freq: Three times a day (TID) | INTRAVENOUS | Status: DC
  Administered 2020-11-23: 500 mg via INTRAVENOUS
  Filled 2020-11-23: qty 100

## 2020-11-23 MED ORDER — IBUPROFEN 400 MG PO TABS: 800.0000 mg | ORAL_TABLET | Freq: Three times a day (TID) | ORAL | Status: DC | PRN

## 2020-11-23 MED ORDER — PIPERACILLIN-TAZOBACTAM 3.375 G IVPB 30 MIN: 3.3750 g | Freq: Three times a day (TID) | INTRAVENOUS | Status: DC

## 2020-11-23 MED ORDER — ONDANSETRON HCL 4 MG/2ML IJ SOLN: 4.0000 mg | Freq: Four times a day (QID) | INTRAMUSCULAR | Status: DC | PRN

## 2020-11-23 MED ORDER — ENOXAPARIN SODIUM 120 MG/0.8ML IJ SOSY
120.0000 mg | PREFILLED_SYRINGE | Freq: Two times a day (BID) | INTRAMUSCULAR | Status: DC
  Administered 2020-11-23 – 2020-11-25 (×5): 120 mg via SUBCUTANEOUS
  Filled 2020-11-23 (×6): qty 0.8

## 2020-11-23 MED ORDER — HYDROMORPHONE HCL 1 MG/ML IJ SOLN
1.0000 mg | INTRAMUSCULAR | Status: DC | PRN
Start: 2020-11-23 — End: 2020-11-26
  Administered 2020-11-23 – 2020-11-25 (×7): 1 mg via INTRAVENOUS
  Filled 2020-11-23 (×7): qty 1

## 2020-11-23 MED ORDER — SODIUM CHLORIDE 0.9% FLUSH: 3.0000 mL | INTRAVENOUS | Status: DC | PRN

## 2020-11-23 MED ORDER — NICOTINE 14 MG/24HR TD PT24
14.0000 mg | MEDICATED_PATCH | Freq: Every day | TRANSDERMAL | Status: DC
  Administered 2020-11-23 – 2020-11-25 (×3): 14 mg via TRANSDERMAL
  Filled 2020-11-23 (×3): qty 1

## 2020-11-23 MED ORDER — ONDANSETRON HCL 4 MG PO TABS: 4.0000 mg | ORAL_TABLET | Freq: Four times a day (QID) | ORAL | Status: DC | PRN

## 2020-11-23 NOTE — Plan of Care (Signed)
  Problem: Activity: Goal: Risk for activity intolerance will decrease Outcome: Progressing   

## 2020-11-23 NOTE — ED Notes (Signed)
Floor will not take pt until she is covid swabbed

## 2020-11-23 NOTE — ED Triage Notes (Signed)
Pt c/o right lower abdominal pain, has had fever. Pain started yesterday suddenly and has been constant. Pt has had nausea and vomiting. Pt denies diarrhea or constipation.

## 2020-11-23 NOTE — ED Notes (Signed)
[facility]    Cherly Anderson, PA-C 11/23/20 0030
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
## 2020-11-23 NOTE — ED Notes (Signed)
Pt stated she was stepping outside but would return

## 2020-11-23 NOTE — ED Notes (Signed)
Pt stated they were stepping outside  

## 2020-11-23 NOTE — Progress Notes (Signed)
New Admission Note:   Arrival Method: Stretcher  Mental Orientation: Alert and oriented  Telemetry: None  Assessment: Completed Skin: Intact discoloration on abdomin and underarms bilateral IV: Right hand  Pain: 10/10  RLQ Tubes: none  Safety Measures: Safety Fall Prevention Plan has been given, discussed and signed Admission: Completed [facility] [place] Orientation: Patient has been orientated to the room, unit and staff.  Family: None  Orders have been reviewed and implemented. Will continue to monitor the patient. Call light has been placed within reach and bed alarm has been activated.   Tashanna Dolin RN [facility] Renal Phone: [phone]

## 2020-11-23 NOTE — H&P (Signed)
[name] is an 35 y.o. female [id number] with LMP 11/12/20. Presents with RLQ pain since this past Wednesday. Has worsen since. Fever and chills Wednesday, afebrile since. Some nausea but no emesis. Denies any bowel or bladder dysfunction.  Sexual active with some discomfort. Last IC over 1 month ago Pt reports traveled by bus to [place] and back first of month. Each trip was 4 days on the bus.  H/O chronic back pain and HTN, managed by PCP.  Last pap unknown Denies STD  SAB x 3  LTCS x 3 Failed BTL, 2008    Menstrual History: Menarche age: 89 Patient's last menstrual period was 11/12/2020.    Past Medical History:  Diagnosis Date  . Allergic rhinitis   . Anemia   . Asthma    exercise induced  . Chronic back pain   . COPD (chronic obstructive pulmonary disease) (HCC)   . GERD (gastroesophageal reflux disease)   . Headache   . Headache(784.0)   . Hypertension   . Kienbock's disease   . Migraines   . Morbid obesity (HCC)   . Trichomonas     Past Surgical History:  Procedure Laterality Date  . CESAREAN SECTION    . CHOLECYSTECTOMY    . choley  gall bladder  . DILATION AND CURETTAGE OF UTERUS    . DILATION AND EVACUATION  05/21/2011   Procedure: DILATATION AND EVACUATION (D&E);  Surgeon: Lazaro Arms, MD;  Location: [facility];  Service: Gynecology;  Laterality: N/A;  . HERNIA REPAIR    . TOOTH EXTRACTION     17 teeth extracted  . TUBAL LIGATION     failed (2017 pregnancy)  . WRIST SURGERY Right 2009    Family History  Problem Relation Age of Onset  . Diabetes Mother   . COPD Mother   . Anxiety disorder Mother   . Hashimoto's thyroiditis Mother   . Hypertension Brother   . COPD Brother   . Depression Brother   . Colon cancer Brother   . Hashimoto's thyroiditis Maternal Aunt   . Anesthesia problems Neg Hx     Social History:  reports that she has been smoking cigarettes. She started smoking about 27 years ago. She has a 23.00 pack-year smoking  history. She has never used smokeless tobacco. She reports that she does not drink alcohol and does not use drugs.  Allergies:  Allergies  Allergen Reactions  . Bee Venom Anaphylaxis  . Ultram [Tramadol Hcl] Anaphylaxis, Hives and Rash  . Duloxetine Hcl Anxiety and Rash  . Magnesium-Containing Compounds Hives and Rash  . Compazine [Prochlorperazine Edisylate] Other (See Comments)    Made patient very depressed    (Not in a hospital admission)   Review of Systems  Gastrointestinal: Positive for abdominal pain and nausea.  Genitourinary: Positive for pelvic pain.    Blood pressure 123/76, pulse 79, temperature 98.5 F (36.9 C), temperature source Oral, resp. rate 14, height 5\' 6"  (1.676 m), weight 116.1 kg, last menstrual period 11/12/2020, SpO2 98 %, unknown if currently breastfeeding. Physical Exam Constitutional:      Appearance: She is obese. She is ill-appearing.  Cardiovascular:     Rate and Rhythm: Normal rate and regular rhythm.  Pulmonary:     Effort: Pulmonary effort is normal.     Breath sounds: Normal breath sounds.  Abdominal:     General: Bowel sounds are normal.     Palpations: Abdomen is soft.     Tenderness: There is abdominal tenderness in  the right lower quadrant.  Genitourinary:    Comments: Deffered Neurological:     Mental Status: She is alert.     Results for orders placed or performed during the hospital encounter of 11/22/20 (from the past 24 hour(s))  Urinalysis, Routine w reflex microscopic Urine, Clean Catch     Status: Abnormal   Collection Time: 11/23/20 12:27 AM  Result Value Ref Range   Color, Urine AMBER (A) YELLOW   APPearance CLEAR CLEAR   Specific Gravity, Urine 1.023 1.005 - 1.030   pH 6.0 5.0 - 8.0   Glucose, UA NEGATIVE NEGATIVE mg/dL   Hgb urine dipstick NEGATIVE NEGATIVE   Bilirubin Urine NEGATIVE NEGATIVE   Ketones, ur NEGATIVE NEGATIVE mg/dL   Protein, ur 30 (A) NEGATIVE mg/dL   Nitrite NEGATIVE NEGATIVE   Leukocytes,Ua  NEGATIVE NEGATIVE   RBC / HPF 0-5 0 - 5 RBC/hpf   WBC, UA 0-5 0 - 5 WBC/hpf   Bacteria, UA NONE SEEN NONE SEEN   Squamous Epithelial / LPF 0-5 0 - 5  Comprehensive metabolic panel     Status: Abnormal   Collection Time: 11/23/20 12:38 AM  Result Value Ref Range   Sodium 136 135 - 145 mmol/L   Potassium 3.4 (L) 3.5 - 5.1 mmol/L   Chloride 100 98 - 111 mmol/L   CO2 28 22 - 32 mmol/L   Glucose, Bld 156 (H) 70 - 99 mg/dL   BUN <5 (L) 6 - 20 mg/dL   Creatinine, Ser 4.090.71 0.44 - 1.00 mg/dL   Calcium 9.2 8.9 - 81.110.3 mg/dL   Total Protein 7.6 6.5 - 8.1 g/dL   Albumin 3.3 (L) 3.5 - 5.0 g/dL   AST 25 15 - 41 U/L   ALT 33 0 - 44 U/L   Alkaline Phosphatase 98 38 - 126 U/L   Total Bilirubin 0.7 0.3 - 1.2 mg/dL   GFR, Estimated >91>60 >47>60 mL/min   Anion gap 8 5 - 15  CBC with Differential     Status: Abnormal   Collection Time: 11/23/20 12:38 AM  Result Value Ref Range   WBC 19.8 (H) 4.0 - 10.5 K/uL   RBC 5.07 3.87 - 5.11 MIL/uL   Hemoglobin 14.4 12.0 - 15.0 g/dL   HCT 82.944.4 56.236.0 - 13.046.0 %   MCV 87.6 80.0 - 100.0 fL   MCH 28.4 26.0 - 34.0 pg   MCHC 32.4 30.0 - 36.0 g/dL   RDW 86.513.1 78.411.5 - 69.615.5 %   Platelets 193 150 - 400 K/uL   nRBC 0.0 0.0 - 0.2 %   Neutrophils Relative % 75 %   Neutro Abs 14.7 (H) 1.7 - 7.7 K/uL   Lymphocytes Relative 18 %   Lymphs Abs 3.7 0.7 - 4.0 K/uL   Monocytes Relative 4 %   Monocytes Absolute 0.9 0.1 - 1.0 K/uL   Eosinophils Relative 2 %   Eosinophils Absolute 0.4 0.0 - 0.5 K/uL   Basophils Relative 0 %   Basophils Absolute 0.1 0.0 - 0.1 K/uL   Immature Granulocytes 1 %   Abs Immature Granulocytes 0.09 (H) 0.00 - 0.07 K/uL  Lipase, blood     Status: None   Collection Time: 11/23/20 12:38 AM  Result Value Ref Range   Lipase 25 11 - 51 U/L  I-Stat beta hCG blood, ED     Status: Abnormal   Collection Time: 11/23/20  1:22 AM  Result Value Ref Range   I-stat hCG, quantitative 20.3 (H) <5 mIU/mL  Comment 3          Pregnancy, urine     Status: None    Collection Time: 11/23/20  1:48 AM  Result Value Ref Range   Preg Test, Ur NEGATIVE NEGATIVE  Wet prep, genital     Status: Abnormal   Collection Time: 11/23/20 10:28 AM   Specimen: Genital  Result Value Ref Range   Yeast Wet Prep HPF POC NONE SEEN NONE SEEN   Trich, Wet Prep NONE SEEN NONE SEEN   Clue Cells Wet Prep HPF POC NONE SEEN NONE SEEN   WBC, Wet Prep HPF POC MODERATE (A) NONE SEEN   Sperm NONE SEEN     US Transvaginal Non-OB  Result Date: 11/23/2020 CLINICAL DATA:  Initial evaluation for acute right lower quadrant pain. EXAM: TRANSABDOMINAL AND TRANSVAGINAL ULTRASOUND OF PELVIS DOPPLER ULTRASOUND OF OVARIES TECHNIQUE: Both transabdominal and transvaginal ultrasound examinations of the pelvis were performed. Transabdominal technique was performed for global imaging of the pelvis including uterus, ovaries, adnexal regions, and pelvic cul-de-sac. It was necessary to proceed with endovaginal exam following the transabdominal exam to visualize the uterus, endometrium, and ovaries. Color and duplex Doppler ultrasound was utilized to evaluate blood flow to the ovaries. COMPARISON:  Prior CT from earlier the same day. FINDINGS: Uterus Measurements: 8.8 x 4.3 x 5.6 cm = volume: 111.9 mL. Uterus is anteverted. No discrete fibroid or other mass. Endometrium Thickness: 8.6 mm. No focal abnormality visualized. Trace simple fluid noted within the endocervical canal. Right ovary Measurements: 4.8 x 3.5 x 5.8 cm = volume: 51.8 mL. Evaluation of the right ovary somewhat limited as it is only seen via transabdominal imaging. Ovary itself is mildly enlarged but without other abnormality. No adnexal mass. Left ovary Measurements: 3.7 x 2.6 x 3.2 cm = volume: 15.4 mL. Normal appearance/no adnexal mass. Pulsed Doppler evaluation of both ovaries demonstrates normal low-resistance arterial and venous waveforms. No sonographic evidence for ovarian torsion. Other findings No abnormal free fluid. IMPRESSION: 1. Mild  asymmetric enlargement of the right ovary, but without sonographic evidence for acute ovarian torsion. Upon review of prior CT, the majority of the inflammatory stranding appears to encompass the right gonadal vein, which could reflect an underlying acute phlebitis/thrombophlebitis. If patient's symptoms should worsen or the clinical scenario should warrant, a short interval follow-up ultrasound to ensure the right ovary remains perfused would be recommended. 2. Otherwise unremarkable and normal pelvic ultrasound. No other acute abnormality. Electronically Signed   By: Rise Mu M.D.   On: 11/23/2020 04:01   US Pelvis Complete  Result Date: 11/23/2020 CLINICAL DATA:  Initial evaluation for acute right lower quadrant pain. EXAM: TRANSABDOMINAL AND TRANSVAGINAL ULTRASOUND OF PELVIS DOPPLER ULTRASOUND OF OVARIES TECHNIQUE: Both transabdominal and transvaginal ultrasound examinations of the pelvis were performed. Transabdominal technique was performed for global imaging of the pelvis including uterus, ovaries, adnexal regions, and pelvic cul-de-sac. It was necessary to proceed with endovaginal exam following the transabdominal exam to visualize the uterus, endometrium, and ovaries. Color and duplex Doppler ultrasound was utilized to evaluate blood flow to the ovaries. COMPARISON:  Prior CT from earlier the same day. FINDINGS: Uterus Measurements: 8.8 x 4.3 x 5.6 cm = volume: 111.9 mL. Uterus is anteverted. No discrete fibroid or other mass. Endometrium Thickness: 8.6 mm. No focal abnormality visualized. Trace simple fluid noted within the endocervical canal. Right ovary Measurements: 4.8 x 3.5 x 5.8 cm = volume: 51.8 mL. Evaluation of the right ovary somewhat limited as it is only seen via  transabdominal imaging. Ovary itself is mildly enlarged but without other abnormality. No adnexal mass. Left ovary Measurements: 3.7 x 2.6 x 3.2 cm = volume: 15.4 mL. Normal appearance/no adnexal mass. Pulsed Doppler  evaluation of both ovaries demonstrates normal low-resistance arterial and venous waveforms. No sonographic evidence for ovarian torsion. Other findings No abnormal free fluid. IMPRESSION: 1. Mild asymmetric enlargement of the right ovary, but without sonographic evidence for acute ovarian torsion. Upon review of prior CT, the majority of the inflammatory stranding appears to encompass the right gonadal vein, which could reflect an underlying acute phlebitis/thrombophlebitis. If patient's symptoms should worsen or the clinical scenario should warrant, a short interval follow-up ultrasound to ensure the right ovary remains perfused would be recommended. 2. Otherwise unremarkable and normal pelvic ultrasound. No other acute abnormality. Electronically Signed   By: Rise Mu M.D.   On: 11/23/2020 04:01   [place] Art/Ven Flow Abd Pelv Doppler  Result Date: 11/23/2020 CLINICAL DATA:  Initial evaluation for acute right lower quadrant pain. EXAM: TRANSABDOMINAL AND TRANSVAGINAL ULTRASOUND OF PELVIS DOPPLER ULTRASOUND OF OVARIES TECHNIQUE: Both transabdominal and transvaginal ultrasound examinations of the pelvis were performed. Transabdominal technique was performed for global imaging of the pelvis including uterus, ovaries, adnexal regions, and pelvic cul-de-sac. It was necessary to proceed with endovaginal exam following the transabdominal exam to visualize the uterus, endometrium, and ovaries. Color and duplex Doppler ultrasound was utilized to evaluate blood flow to the ovaries. COMPARISON:  Prior CT from earlier the same day. FINDINGS: Uterus Measurements: 8.8 x 4.3 x 5.6 cm = volume: 111.9 mL. Uterus is anteverted. No discrete fibroid or other mass. Endometrium Thickness: 8.6 mm. No focal abnormality visualized. Trace simple fluid noted within the endocervical canal. Right ovary Measurements: 4.8 x 3.5 x 5.8 cm = volume: 51.8 mL. Evaluation of the right ovary somewhat limited as it is only seen via  transabdominal imaging. Ovary itself is mildly enlarged but without other abnormality. No adnexal mass. Left ovary Measurements: 3.7 x 2.6 x 3.2 cm = volume: 15.4 mL. Normal appearance/no adnexal mass. Pulsed Doppler evaluation of both ovaries demonstrates normal low-resistance arterial and venous waveforms. No sonographic evidence for ovarian torsion. Other findings No abnormal free fluid. IMPRESSION: 1. Mild asymmetric enlargement of the right ovary, but without sonographic evidence for acute ovarian torsion. Upon review of prior CT, the majority of the inflammatory stranding appears to encompass the right gonadal vein, which could reflect an underlying acute phlebitis/thrombophlebitis. If patient's symptoms should worsen or the clinical scenario should warrant, a short interval follow-up ultrasound to ensure the right ovary remains perfused would be recommended. 2. Otherwise unremarkable and normal pelvic ultrasound. No other acute abnormality. Electronically Signed   By: Rise Mu M.D.   On: 11/23/2020 04:01   CT Renal Stone Study  Result Date: 11/23/2020 CLINICAL DATA:  35 year old female with right lower quadrant abdominal pain. EXAM: CT ABDOMEN AND PELVIS WITHOUT CONTRAST TECHNIQUE: Multidetector CT imaging of the abdomen and pelvis was performed following the standard protocol without IV contrast. COMPARISON:  CT abdomen pelvis dated 06/14/2015. FINDINGS: Evaluation of this exam is limited in the absence of intravenous contrast. Lower chest: The visualized lung bases are clear. No intra-abdominal free air or free fluid. Hepatobiliary: There is severe fatty liver. The liver is enlarged. Correlation with clinical exam and LFTs recommended to evaluate for possibility of steatohepatitis. No intrahepatic biliary ductal dilatation. Cholecystectomy. No retained calcified stone noted in the central CBD. Pancreas: Unremarkable. No pancreatic ductal dilatation or surrounding inflammatory changes.  Spleen: Normal in size without focal abnormality. Adrenals/Urinary Tract: The adrenal glands are unremarkable. The kidneys, visualized ureters, and urinary bladder appear unremarkable. Stomach/Bowel: There is no bowel obstruction or active inflammation. The appendix is normal. Vascular/Lymphatic: The abdominal aorta and IVC unremarkable on this noncontrast CT. No portal venous gas. There is no adenopathy. Reproductive: The uterus is anteverted and grossly unremarkable. The left ovary is unremarkable. The right ovary is enlarged. There is inflammatory changes surrounding the right ovary and right gonadal vein. Further evaluation with pelvic ultrasound recommended to exclude ovarian torsion. Gonadal vein thrombosis is not excluded but cannot be evaluated on this noncontrast CT. Other: Anterior pelvic wall C-section scar. Musculoskeletal: No acute or significant osseous findings. IMPRESSION: 1. Inflammatory changes surrounding the right ovary and right gonadal vein. Dedicated pelvic ultrasound with duplex interrogation of the ovarian perfusion is recommended. 2. Severe fatty liver with hepatomegaly. Correlation with clinical exam and LFTs recommended to evaluate for possibility of steatohepatitis. 3. No bowel obstruction. Normal appendix. 4. No hydronephrosis or nephrolithiasis. Electronically Signed   By: Elgie Collard M.D.   On: 11/23/2020 02:53    Assessment/Plan: RLQ pain, ? SPT by U/S  Not the typical presentation of SPT but she does have a recent H/O prolonged sitting. Will start IV antibiotics and LMWH at 1 mg/kg bid. Vaginal and blood cultures have been obtained. POC reviewed with pt.   Hermina Staggers 11/23/2020, 2:43 PM

## 2020-11-23 NOTE — ED Provider Notes (Addendum)
  Emergency Medicine Provider in Triage Note   MSE was initiated and I personally evaluated the patient  12:24 AM on Nov 23, 2020 as provider in triage.   Chief Complaint: abdominal pain  HPI  Patient is a 35 y.o. who presents to the ED with complaints of abdominal pain that began yesterday. Patient reports fairly sudden onset pain to the RLQ. Radiates to the back. Worse with movement. No alleviating factors. Reports associated N/V.     Review of Systems  Positive: Abdominal pain, N/V Negative: Diarrhea, vaginal bleeding, dysuria  Physical Exam  BP (!) 166/96 (BP Location: Right Arm)   Pulse (!) 105   Temp 98.4 F (36.9 C) (Oral)   Resp 18   SpO2 96%    Gen:   Awake HEENT:  Atraumatic  Resp:  Normal effort  Cardiac:  Mildly tachy Abd:   Nondistended, R mid to lower quadrant tenderness MSK:   Moves extremities without difficulty  Neuro:  Speech clear   Medical Decision Making   Initiation of care has begun. The patient has been counseled on the process, plan, and necessity for staying for the completion/evaluation, informed that the remainder of the evaluation will be completed by another provider, this initial triage assessment does not replace that evaluation, and the importance of remaining in the ED until their evaluation is complete.   Clinical Impression  Abdominal pain    Cherly Anderson, PA-C 11/23/20 0029  CT renal stone study reviewed- abnormal findings concerning right ovary, torsion [place] study ordered- I called & discussed need for prompt imaging with [place] technician @ 02:58.   03:10: CONSULT: Discussed case with Dr. Vergie Living, obgyn, given concern for torsion, made aware of CT imaging findings and that patient is in [place] at this time preemptively.   [place] w/o sonographic evidence of acute torsion. Patient remains pending acute care bed placement at this time.    Desmond Lope 11/23/20 0435    Palumbo, April, MD 11/23/20 [phone]

## 2020-11-24 LAB — COMPREHENSIVE METABOLIC PANEL
ALT: 40 U/L (ref 0–44)
AST: 45 U/L — ABNORMAL HIGH (ref 15–41)
Albumin: 2.9 g/dL — ABNORMAL LOW (ref 3.5–5.0)
Alkaline Phosphatase: 84 U/L (ref 38–126)
Anion gap: 9 (ref 5–15)
BUN: <5 mg/dL — ABNORMAL LOW (ref 6–20)
CO2: 27 mmol/L (ref 22–32)
Calcium: 8.6 mg/dL — ABNORMAL LOW (ref 8.9–10.3)
Chloride: 102 mmol/L (ref 98–111)
Creatinine, Ser: 0.83 mg/dL (ref 0.44–1.00)
GFR, Estimated: >60 mL/min (ref >60)
Glucose, Bld: 132 mg/dL — ABNORMAL HIGH (ref 70–99)
Potassium: 3.7 mmol/L (ref 3.5–5.1)
Sodium: 138 mmol/L (ref 135–145)
Total Bilirubin: 0.8 mg/dL (ref 0.3–1.2)
Total Protein: 6.9 g/dL (ref 6.5–8.1)

## 2020-11-24 LAB — CBC WITH DIFFERENTIAL/PLATELET
Abs Immature Granulocytes: 0.06 10*3/uL (ref 0.00–0.07)
Basophils Absolute: 0.1 10*3/uL (ref 0.0–0.1)
Basophils Relative: 0 %
Eosinophils Absolute: 0.5 10*3/uL (ref 0.0–0.5)
Eosinophils Relative: 4 %
HCT: 41.6 % (ref 36.0–46.0)
Hemoglobin: 13.9 g/dL (ref 12.0–15.0)
Immature Granulocytes: 1 %
Lymphocytes Relative: 22 %
Lymphs Abs: 2.9 10*3/uL (ref 0.7–4.0)
MCH: 29 pg (ref 26.0–34.0)
MCHC: 33.4 g/dL (ref 30.0–36.0)
MCV: 86.8 fL (ref 80.0–100.0)
Monocytes Absolute: 0.7 10*3/uL (ref 0.1–1.0)
Monocytes Relative: 5 %
Neutro Abs: 8.9 10*3/uL — ABNORMAL HIGH (ref 1.7–7.7)
Neutrophils Relative %: 68 %
Platelets: 184 10*3/uL (ref 150–400)
RBC: 4.79 MIL/uL (ref 3.87–5.11)
RDW: 12.9 % (ref 11.5–15.5)
WBC: 13.1 10*3/uL — ABNORMAL HIGH (ref 4.0–10.5)
nRBC: 0 % (ref 0.0–0.2)

## 2020-11-24 LAB — BLOOD CULTURE ID PANEL (REFLEXED) - BCID2

## 2020-11-24 MED ORDER — IBUPROFEN 400 MG PO TABS
800.0000 mg | ORAL_TABLET | Freq: Three times a day (TID) | ORAL | Status: DC
  Administered 2020-11-24 – 2020-11-25 (×4): 800 mg via ORAL
  Filled 2020-11-24: qty 4
  Filled 2020-11-24 (×6): qty 2

## 2020-11-24 MED ORDER — SODIUM CHLORIDE 0.9 % IV SOLN
INTRAVENOUS | Status: DC | PRN
  Administered 2020-11-24: 1000 mL via INTRAVENOUS

## 2020-11-24 NOTE — Progress Notes (Signed)
PHARMACY - PHYSICIAN COMMUNICATION CRITICAL VALUE ALERT - BLOOD CULTURE IDENTIFICATION (BCID)  [name] is an 35 y.o. female who presented to [facility] on 11/22/2020 with a chief complaint of abdominal pain with suspected septic pelvic thrombophlebitis.   Assessment:  35 yo W with 1/2 Bcx growing staph epidermidis, may be contaminant. WBC down to 13 on Zosyn for possible IAI. Consider repeat blood culture to confirm contaminant.   Name of physician (or Provider) Contacted: Scheryl Darter  Current antibiotics: piperacillin/tazobactam   Changes to prescribed antibiotics recommended:  Patient is on recommended antibiotics - No changes needed  Results for orders placed or performed during the hospital encounter of 11/22/20  Blood Culture ID Panel (Reflexed) (Collected: 11/23/2020 11:11 AM)  Result Value Ref Range   Enterococcus faecalis NOT DETECTED NOT DETECTED   Enterococcus Faecium NOT DETECTED NOT DETECTED   Listeria monocytogenes NOT DETECTED NOT DETECTED   Staphylococcus species DETECTED (A) NOT DETECTED   Staphylococcus aureus (BCID) NOT DETECTED NOT DETECTED   Staphylococcus epidermidis DETECTED (A) NOT DETECTED   Staphylococcus lugdunensis NOT DETECTED NOT DETECTED   Streptococcus species NOT DETECTED NOT DETECTED   Streptococcus agalactiae NOT DETECTED NOT DETECTED   Streptococcus pneumoniae NOT DETECTED NOT DETECTED   Streptococcus pyogenes NOT DETECTED NOT DETECTED   A.calcoaceticus-baumannii NOT DETECTED NOT DETECTED   Bacteroides fragilis NOT DETECTED NOT DETECTED   Enterobacterales NOT DETECTED NOT DETECTED   Enterobacter cloacae complex NOT DETECTED NOT DETECTED   Escherichia coli NOT DETECTED NOT DETECTED   Klebsiella aerogenes NOT DETECTED NOT DETECTED   Klebsiella oxytoca NOT DETECTED NOT DETECTED   Klebsiella pneumoniae NOT DETECTED NOT DETECTED   Proteus species NOT DETECTED NOT DETECTED   Salmonella species NOT DETECTED NOT DETECTED   Serratia  marcescens NOT DETECTED NOT DETECTED   Haemophilus influenzae NOT DETECTED NOT DETECTED   Neisseria meningitidis NOT DETECTED NOT DETECTED   Pseudomonas aeruginosa NOT DETECTED NOT DETECTED   Stenotrophomonas maltophilia NOT DETECTED NOT DETECTED   Candida albicans NOT DETECTED NOT DETECTED   Candida auris NOT DETECTED NOT DETECTED   Candida glabrata NOT DETECTED NOT DETECTED   Candida krusei NOT DETECTED NOT DETECTED   Candida parapsilosis NOT DETECTED NOT DETECTED   Candida tropicalis NOT DETECTED NOT DETECTED   Cryptococcus neoformans/gattii NOT DETECTED NOT DETECTED   Methicillin resistance mecA/C DETECTED (A) NOT DETECTED    Alphia Moh, PharmD, BCPS, BCCP Clinical Pharmacist  Please check AMION for all [facility] phone numbers After 10:00 PM, call Main Pharmacy [phone]

## 2020-11-24 NOTE — Progress Notes (Signed)
Subjective:slight improvement in RLQ pain but still needs pain meds. Has headache intermittently Patient reports tolerating PO.    Objective: I have reviewed patient's vital signs, medications, labs and radiology results.  General: alert, cooperative and mild distress GI: RLQ tender Extremities: extremities normal, atraumatic, no cyanosis or edema   Assessment/Plan: HD 2 possible SPT, continue present management  LOS: 1 day    Scheryl Darter 11/24/2020, 9:10 AM

## 2020-11-25 LAB — GC/CHLAMYDIA PROBE AMP (~~LOC~~) NOT AT ARMC
Chlamydia: NEGATIVE
Comment: NEGATIVE
Comment: NORMAL
Neisseria Gonorrhea: NEGATIVE

## 2020-11-25 LAB — CBC
HCT: 41.8 % (ref 36.0–46.0)
Hemoglobin: 14 g/dL (ref 12.0–15.0)
MCH: 28.7 pg (ref 26.0–34.0)
MCHC: 33.5 g/dL (ref 30.0–36.0)
MCV: 85.7 fL (ref 80.0–100.0)
Platelets: 199 10*3/uL (ref 150–400)
RBC: 4.88 MIL/uL (ref 3.87–5.11)
RDW: 12.3 % (ref 11.5–15.5)
WBC: 10.6 10*3/uL — ABNORMAL HIGH (ref 4.0–10.5)
nRBC: 0 % (ref 0.0–0.2)

## 2020-11-25 MED ORDER — IBUPROFEN 800 MG PO TABS: 800.0000 mg | ORAL_TABLET | Freq: Three times a day (TID) | ORAL | 0 refills | Status: AC

## 2020-11-25 MED ORDER — NIFEDIPINE ER 30 MG PO TB24: 30.0000 mg | ORAL_TABLET | Freq: Every day | ORAL | 1 refills | Status: DC

## 2020-11-25 MED ORDER — CIPROFLOXACIN HCL 500 MG PO TABS: 500.0000 mg | ORAL_TABLET | Freq: Two times a day (BID) | ORAL | 0 refills | Status: AC

## 2020-11-25 MED ORDER — ENOXAPARIN SODIUM 120 MG/0.8ML IJ SOSY: 120.0000 mg | PREFILLED_SYRINGE | Freq: Two times a day (BID) | INTRAMUSCULAR | 0 refills | Status: DC

## 2020-11-25 MED ORDER — METRONIDAZOLE 500 MG PO TABS: 500.0000 mg | ORAL_TABLET | Freq: Two times a day (BID) | ORAL | 0 refills | Status: DC

## 2020-11-25 MED ORDER — HYDROMORPHONE HCL 2 MG PO TABS: 2.0000 mg | ORAL_TABLET | ORAL | 0 refills | Status: DC | PRN

## 2020-11-25 MED ORDER — NICOTINE 14 MG/24HR TD PT24: 14.0000 mg | MEDICATED_PATCH | Freq: Every day | TRANSDERMAL | 0 refills | Status: DC

## 2020-11-25 NOTE — Progress Notes (Signed)
Patient D/C to go home tonight. A & O x 4. Denies any acute pain at this time. No respiratory distress noted. V/signs stable. Went over her AVS with her and a copy placed in a chart. Will take patient to her car via w/c by myself.

## 2020-11-25 NOTE — Progress Notes (Signed)
Subjective: Patient reports tolerating PO.  Pain is better but still using pain medication  Objective: Blood pressure 135/85, pulse 86, temperature 98.1 F (36.7 C), temperature source Oral, resp. rate 17, height 5\' 6"  (1.676 m), weight 116.1 kg, last menstrual period 11/12/2020, SpO2 96 %, unknown if currently breastfeeding.  I have reviewed patient's vital signs, medications, labs and microbiology.  General: alert, cooperative and no distress Resp: normal effort GI: soft minimal tenderness in RLQ   Assessment/Plan: Continues clinical improvement, noted blood cultures which I suspect have contaminant, not repeated. Continue present abx therapy  LOS: 2 days    01/12/2021 11/25/2020, 6:34 AM

## 2020-11-25 NOTE — Discharge Summary (Signed)
Physician Discharge Summary  Patient ID: [name] MRN: [id number] DOB/AGE: July 24, 1985 35 y.o.  Admit date: 11/22/2020 Discharge date: 11/25/2020  Admission Diagnoses:  Discharge Diagnoses:  Active Problems:   Septic thrombophlebitis   Discharged Condition: good  Hospital Course: Patient admitted with septic thrombophlebitis. Patient received antibiotic treatment with zosyn for over 48 hours and anticogaulation with lovenox 1mg /kg BID. Patient pain is improving and patient remained afebrile. WBC normalizing. Patient is requesting to go home as she has a lot of family issues she needs to tend to. Evening dose of zosyn given earlier. Patient discharged on a 14-day course of antibiotic and lovenox. Patient to follow in 2 weeks. Precautions reviewed  Consults: None   Treatments: antibiotics: Zosyn and anticoagulation: LMW heparin  Discharge Exam: Blood pressure (!) 142/90, pulse 85, temperature 98.2 F (36.8 C), temperature source Oral, resp. rate 18, height 5\' 6"  (1.676 m), weight 116.1 kg, last menstrual period 11/12/2020, SpO2 95 %, unknown if currently breastfeeding. General appearance: alert, cooperative and no distress Resp: clear to auscultation bilaterally Cardio: regular rate and rhythm GI: soft, non-tender; bowel sounds normal; no masses,  no organomegaly Extremities: extremities normal, atraumatic, no cyanosis or edema  Disposition: HOme   Allergies as of 11/25/2020      Reactions   Bee Venom Anaphylaxis   Ultram [tramadol Hcl] Anaphylaxis, Hives, Rash   Duloxetine Hcl Anxiety, Rash   Magnesium-containing Compounds Hives, Rash   Compazine [prochlorperazine Edisylate] Other (See Comments)   Made patient very depressed      Medication List    STOP taking these medications   aspirin EC 81 MG tablet   glucose blood test strip   guaiFENesin 200 MG tablet   insulin aspart 100 UNIT/ML injection Commonly known as: novoLOG   insulin NPH Human 100  UNIT/ML injection Commonly known as: NOVOLIN N   Insulin Syringe-Needle U-100 30G X 5/16" 0.5 ML Misc   labetalol 200 MG tablet Commonly known as: NORMODYNE   Miconazole Nitrate 2 % Powd   oseltamivir 75 MG capsule Commonly known as: TAMIFLU   Oxycodone HCl 10 MG Tabs   oxyCODONE-acetaminophen 10-325 MG tablet Commonly known as: Percocet   pantoprazole 40 MG tablet Commonly known as: Protonix   Peak Flow Meter Devi   ranitidine 150 MG tablet Commonly known as: Zantac     TAKE these medications   albuterol 108 (90 Base) MCG/ACT inhaler Commonly known as: VENTOLIN HFA Inhale 2 puffs into the lungs every 4 (four) hours as needed for wheezing or shortness of breath.   beclomethasone 80 MCG/ACT inhaler Commonly known as: Qvar Inhale 2 puffs into the lungs 2 (two) times daily.   ciprofloxacin 500 MG tablet Commonly known as: Cipro Take 1 tablet (500 mg total) by mouth 2 (two) times daily for 14 days.   cyanocobalamin 1000 MCG/ML injection Commonly known as: (VITAMIN B-12) Inject 58ml daily x 6 days, inject 67ml once weekly x 4 weeks, then inject 70ml monthly x 10 months thereafter.   enoxaparin 120 MG/0.8ML injection Commonly known as: LOVENOX Inject 0.8 mLs (120 mg total) into the skin every 12 (twelve) hours for 14 days. Start taking on: Nov 26, 2020   hydrochlorothiazide 25 MG tablet Commonly known as: HYDRODIURIL Take 25 mg by mouth daily.   HYDROmorphone 2 MG tablet Commonly known as: DILAUDID Take 1 tablet (2 mg total) by mouth every 4 (four) hours as needed for severe pain.   ibuprofen 800 MG tablet Commonly known as: ADVIL Take 1 tablet (  800 mg total) by mouth every 8 (eight) hours.   metroNIDAZOLE 500 MG tablet Commonly known as: Flagyl Take 1 tablet (500 mg total) by mouth 2 (two) times daily.   nicotine 14 mg/24hr patch Commonly known as: NICODERM CQ - dosed in mg/24 hours Place 1 patch (14 mg total) onto the skin daily. Start taking on: Nov 26, 2020   NIFEdipine 30 MG 24 hr tablet Commonly known as: ADALAT CC Take 1 tablet (30 mg total) by mouth daily. Start taking on: Nov 26, 2020   nortriptyline 50 MG capsule Commonly known as: PAMELOR Take 50 mg by mouth at bedtime as needed (sleep).   phentermine 37.5 MG capsule Take 37.5 mg by mouth daily.   Prenatal Vitamins 0.8 MG tablet Take 1 tablet by mouth daily.   Vitamin D (Ergocalciferol) 1.25 MG (50000 UNIT) Caps capsule Commonly known as: DRISDOL Take 50,000 Units by mouth once a week. Wednesdays       Follow-up Information    [facility] Follow up in 2 week(s).   Specialty: Obstetrics and Gynecology Why: An appointment will be made for her to follow up in 2 weeks Contact information: [address] [place] [address] [phone]              Signed: Catalina Antigua 11/25/2020, 9:10 PM

## 2020-11-26 LAB — CULTURE, BLOOD (SINGLE)

## 2020-12-13 ENCOUNTER — Ambulatory Visit: Payer: Medicaid Other | Admitting: Family Medicine

## 2020-12-13 ENCOUNTER — Encounter: Payer: Self-pay | Admitting: Family Medicine

## 2020-12-13 NOTE — Progress Notes (Signed)
Patient did not keep appointment today. She may call to reschedule.  

## 2021-04-17 ENCOUNTER — Encounter (HOSPITAL_COMMUNITY): Payer: Self-pay | Admitting: Emergency Medicine

## 2021-04-17 ENCOUNTER — Other Ambulatory Visit: Payer: Self-pay

## 2021-04-17 ENCOUNTER — Emergency Department (HOSPITAL_COMMUNITY)
Admission: EM | Admit: 2021-04-17 | Discharge: 2021-04-18 | Disposition: A | Discharge status: Home / Self Care | Payer: Medicaid Other | Attending: Emergency Medicine | Admitting: Emergency Medicine

## 2021-04-17 DIAGNOSIS — J4550 Severe persistent asthma, uncomplicated: Secondary | ICD-10-CM | POA: Diagnosis not present

## 2021-04-17 DIAGNOSIS — R112 Nausea with vomiting, unspecified: Secondary | ICD-10-CM | POA: Insufficient documentation

## 2021-04-17 DIAGNOSIS — N9489 Other specified conditions associated with female genital organs and menstrual cycle: Secondary | ICD-10-CM | POA: Insufficient documentation

## 2021-04-17 DIAGNOSIS — R1084 Generalized abdominal pain: Secondary | ICD-10-CM | POA: Diagnosis not present

## 2021-04-17 DIAGNOSIS — J449 Chronic obstructive pulmonary disease, unspecified: Secondary | ICD-10-CM | POA: Diagnosis not present

## 2021-04-17 DIAGNOSIS — R102 Pelvic and perineal pain: Secondary | ICD-10-CM

## 2021-04-17 DIAGNOSIS — I10 Essential (primary) hypertension: Secondary | ICD-10-CM | POA: Diagnosis not present

## 2021-04-17 DIAGNOSIS — D72829 Elevated white blood cell count, unspecified: Secondary | ICD-10-CM | POA: Insufficient documentation

## 2021-04-17 DIAGNOSIS — R1032 Left lower quadrant pain: Secondary | ICD-10-CM | POA: Diagnosis present

## 2021-04-17 DIAGNOSIS — F17210 Nicotine dependence, cigarettes, uncomplicated: Secondary | ICD-10-CM | POA: Insufficient documentation

## 2021-04-17 DIAGNOSIS — Z7951 Long term (current) use of inhaled steroids: Secondary | ICD-10-CM | POA: Diagnosis not present

## 2021-04-17 DIAGNOSIS — M25561 Pain in right knee: Secondary | ICD-10-CM | POA: Diagnosis not present

## 2021-04-17 DIAGNOSIS — Z79899 Other long term (current) drug therapy: Secondary | ICD-10-CM | POA: Diagnosis not present

## 2021-04-17 NOTE — ED Triage Notes (Signed)
Pt c/o abdominal pain to the left of her belly button, nausea/vomiting, and constantly feeling full.

## 2021-04-18 ENCOUNTER — Encounter (HOSPITAL_COMMUNITY): Payer: Self-pay | Admitting: Radiology

## 2021-04-18 ENCOUNTER — Emergency Department (HOSPITAL_COMMUNITY): Payer: Medicaid Other

## 2021-04-18 LAB — URINALYSIS, ROUTINE W REFLEX MICROSCOPIC
Bacteria, UA: NONE SEEN
Bilirubin Urine: NEGATIVE
Glucose, UA: >=500 mg/dL — AB
Ketones, ur: NEGATIVE mg/dL
Leukocytes,Ua: NEGATIVE
Nitrite: NEGATIVE
Protein, ur: NEGATIVE mg/dL
RBC / HPF: >50 RBC/hpf — ABNORMAL HIGH (ref 0–5)
Specific Gravity, Urine: 1.013 (ref 1.005–1.030)
pH: 7 (ref 5.0–8.0)

## 2021-04-18 LAB — CBC
HCT: 43.5 % (ref 36.0–46.0)
Hemoglobin: 14.5 g/dL (ref 12.0–15.0)
MCH: 27.5 pg (ref 26.0–34.0)
MCHC: 33.3 g/dL (ref 30.0–36.0)
MCV: 82.5 fL (ref 80.0–100.0)
Platelets: 213 10*3/uL (ref 150–400)
RBC: 5.27 MIL/uL — ABNORMAL HIGH (ref 3.87–5.11)
RDW: 12.9 % (ref 11.5–15.5)
WBC: 14 10*3/uL — ABNORMAL HIGH (ref 4.0–10.5)
nRBC: 0 % (ref 0.0–0.2)

## 2021-04-18 LAB — COMPREHENSIVE METABOLIC PANEL
ALT: 36 U/L (ref 0–44)
AST: 47 U/L — ABNORMAL HIGH (ref 15–41)
Albumin: 3.2 g/dL — ABNORMAL LOW (ref 3.5–5.0)
Alkaline Phosphatase: 115 U/L (ref 38–126)
Anion gap: 11 (ref 5–15)
BUN: 7 mg/dL (ref 6–20)
CO2: 26 mmol/L (ref 22–32)
Calcium: 8.9 mg/dL (ref 8.9–10.3)
Chloride: 97 mmol/L — ABNORMAL LOW (ref 98–111)
Creatinine, Ser: 0.91 mg/dL (ref 0.44–1.00)
GFR, Estimated: >60 mL/min (ref >60)
Glucose, Bld: 297 mg/dL — ABNORMAL HIGH (ref 70–99)
Potassium: 3.5 mmol/L (ref 3.5–5.1)
Sodium: 134 mmol/L — ABNORMAL LOW (ref 135–145)
Total Bilirubin: 0.4 mg/dL (ref 0.3–1.2)
Total Protein: 7.2 g/dL (ref 6.5–8.1)

## 2021-04-18 LAB — LIPASE, BLOOD: Lipase: 34 U/L (ref 11–51)

## 2021-04-18 LAB — I-STAT BETA HCG BLOOD, ED (MC, WL, AP ONLY): I-stat hCG, quantitative: <5 m[IU]/mL (ref <5)

## 2021-04-18 MED ORDER — ONDANSETRON HCL 4 MG/2ML IJ SOLN
4.0000 mg | Freq: Once | INTRAMUSCULAR | Status: AC
  Administered 2021-04-18: 4 mg via INTRAVENOUS
  Filled 2021-04-18: qty 2

## 2021-04-18 MED ORDER — HYDROMORPHONE HCL 1 MG/ML IJ SOLN
1.0000 mg | Freq: Once | INTRAMUSCULAR | Status: AC
  Administered 2021-04-18: 1 mg via INTRAVENOUS
  Filled 2021-04-18: qty 1

## 2021-04-18 MED ORDER — SODIUM CHLORIDE 0.9 % IV BOLUS
1000.0000 mL | Freq: Once | INTRAVENOUS | Status: AC
  Administered 2021-04-18: 1000 mL via INTRAVENOUS

## 2021-04-18 MED ORDER — IOHEXOL 300 MG/ML  SOLN
100.0000 mL | Freq: Once | INTRAMUSCULAR | Status: AC | PRN
  Administered 2021-04-18: 100 mL via INTRAVENOUS

## 2021-04-18 NOTE — Discharge Instructions (Addendum)
Your laboratory results are within normal limits today.  We discussed the findings of your CT, ultrasound, laboratory work.  You will need to continue to follow-up with your primary care physician as there is no acute findings on today's visit.  I have also placed a brace to your right knee, please use this for comfort.  Dr. Emergency department if experience worsening symptoms, bleeding, worsening pain
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
## 2021-05-14 ENCOUNTER — Other Ambulatory Visit: Payer: Self-pay | Admitting: Internal Medicine

## 2021-05-14 DIAGNOSIS — Z1231 Encounter for screening mammogram for malignant neoplasm of breast: Secondary | ICD-10-CM

## 2022-02-09 ENCOUNTER — Emergency Department (HOSPITAL_COMMUNITY): Payer: Medicaid Other

## 2022-02-09 ENCOUNTER — Emergency Department (HOSPITAL_COMMUNITY)
Admission: EM | Admit: 2022-02-09 | Discharge: 2022-02-09 | Disposition: A | Discharge status: Home / Self Care | Payer: Medicaid Other | Attending: Emergency Medicine | Admitting: Emergency Medicine

## 2022-02-09 DIAGNOSIS — J449 Chronic obstructive pulmonary disease, unspecified: Secondary | ICD-10-CM | POA: Insufficient documentation

## 2022-02-09 DIAGNOSIS — Z7951 Long term (current) use of inhaled steroids: Secondary | ICD-10-CM | POA: Insufficient documentation

## 2022-02-09 DIAGNOSIS — F17200 Nicotine dependence, unspecified, uncomplicated: Secondary | ICD-10-CM | POA: Diagnosis not present

## 2022-02-09 DIAGNOSIS — M79642 Pain in left hand: Secondary | ICD-10-CM | POA: Diagnosis present

## 2022-02-09 DIAGNOSIS — I10 Essential (primary) hypertension: Secondary | ICD-10-CM | POA: Insufficient documentation

## 2022-02-09 DIAGNOSIS — M25532 Pain in left wrist: Secondary | ICD-10-CM | POA: Diagnosis not present

## 2022-02-09 DIAGNOSIS — Z79899 Other long term (current) drug therapy: Secondary | ICD-10-CM | POA: Insufficient documentation

## 2022-02-09 MED ORDER — IBUPROFEN 400 MG PO TABS
600.0000 mg | ORAL_TABLET | Freq: Once | ORAL | Status: AC
  Administered 2022-02-09: 600 mg via ORAL
  Filled 2022-02-09: qty 1

## 2022-02-09 NOTE — ED Notes (Signed)
Pain and swelling in her lt hand and wrist for 2-3 days no known injury

## 2022-02-09 NOTE — ED Triage Notes (Signed)
Pt c/o swelling to L hand x3 days. Ibuprofen, elevation w no relief. Hx keinbock's disease, advised it might affect L hand "eventually." No injury, trauma. Follow up w PCP Tuesday.

## 2022-02-09 NOTE — ED Provider Notes (Signed)
[facility] EMERGENCY DEPARTMENT Provider Note   CSN: [id number] Arrival date & time: 02/09/22  1721     History  Chief Complaint  Patient presents with   Hand Pain    L    [name] is a 36 y.o. female.   Hand Pain   36 year old female presents emergency department with complaints of left hand pain.  Patient states that she was holding a piece of wood when a saw was cutting the other hand and hit a nail causing her to jerk, twisting her wrist causing left wrist pain.  Incident happened approximately 2 days ago.  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  She denies any breaks in skin or bleeding from the accident.  She denies weakness or sensory deficits from baseline.  She states she has a history of carpal tunnel on the affected extremity and has relatively constant numbness in the palmar aspect of her left hand.  She has tried at home Biofreeze, Voltaren gel with minimal relief of symptoms.    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 Authorizing Provider  albuterol (PROVENTIL HFA;VENTOLIN HFA) 108 (90 Base) MCG/ACT inhaler Inhale 2 puffs into the lungs every 4 (four) hours as needed for wheezing or shortness of breath. 04/04/16   Mumaw, Hiram Comber, DO  beclomethasone (QVAR) 80 MCG/ACT inhaler Inhale 2 puffs into the lungs 2 (two) times daily. 04/18/16   Roslynn Amble, MD  cyanocobalamin (,VITAMIN B-12,) 1000 MCG/ML injection Inject 32ml daily x 6 days, inject 57ml once weekly x 4 weeks, then inject 55ml monthly x 10 months thereafter. Patient not taking: Reported on 11/23/2020 08/21/15   Levert Feinstein, MD  enoxaparin (LOVENOX) 120 MG/0.8ML injection Inject 0.8 mLs (120 mg total) into the skin every 12 (twelve)  hours for 14 days. 11/26/20 12/10/20  Constant, Peggy, MD  hydrochlorothiazide (HYDRODIURIL) 25 MG tablet Take 25 mg by mouth daily. 11/19/20   [provider]  HYDROmorphone (DILAUDID) 2 MG tablet Take 1 tablet (2 mg total) by mouth every 4 (four) hours as needed for severe pain. 11/25/20   Constant, Peggy, MD  ibuprofen (ADVIL) 800 MG tablet Take 1 tablet (800 mg total) by mouth every 8 (eight) hours. 11/25/20   Constant, Peggy, MD  metroNIDAZOLE (FLAGYL) 500 MG tablet Take 1 tablet (500 mg total) by mouth 2 (two) times daily. 11/25/20   Constant, Peggy, MD  nicotine (NICODERM CQ - DOSED IN MG/24 HOURS) 14 mg/24hr patch Place 1 patch (14 mg total) onto the skin daily. 11/26/20   Constant, Peggy, MD  NIFEdipine (ADALAT CC) 30 MG 24 hr tablet Take 1 tablet (30 mg total) by mouth daily. 11/26/20   Constant, Peggy, MD  nortriptyline (PAMELOR) 50 MG capsule Take 50 mg by mouth at bedtime as needed (sleep). 11/19/20   [provider]  phentermine 37.5 MG capsule Take 37.5 mg by mouth daily.    [provider]  Prenatal Multivit-Min-Fe-FA (PRENATAL VITAMINS) 0.8 MG tablet Take 1 tablet by mouth daily. Patient not taking: Reported on 11/23/2020 02/04/16   Katrinka Blazing, [place], CNM  Vitamin D, Ergocalciferol, (DRISDOL) 1.25 MG (50000 UNIT) CAPS capsule Take 50,000 Units by mouth once a week. Wednesdays 11/19/20   [provider]  Allergies    Bee venom, Ultram [tramadol hcl], Duloxetine hcl, Magnesium-containing compounds, and Compazine [prochlorperazine edisylate]    Review of Systems   Review of Systems  All other systems reviewed and are negative.   Physical Exam Updated Vital Signs BP (!) 159/100 (BP Location: Right Arm)   Pulse 95   Temp 98.9 F (37.2 C) (Oral)   Resp 18   SpO2 92%  Physical Exam Vitals and nursing note reviewed.  Constitutional:      General: She is not in acute distress.    Appearance: She is well-developed. She is obese.  HENT:     Head:  Normocephalic and atraumatic.  Eyes:     Conjunctiva/sclera: Conjunctivae normal.  Cardiovascular:     Rate and Rhythm: Normal rate and regular rhythm.     Heart sounds: No murmur heard. Pulmonary:     Effort: Pulmonary effort is normal. No respiratory distress.     Breath sounds: Normal breath sounds.  Abdominal:     Palpations: Abdomen is soft.     Tenderness: There is no abdominal tenderness.  Musculoskeletal:        General: No swelling.     Cervical back: Neck supple.     Right lower leg: No edema.     Left lower leg: No edema.     Comments: Patient has swelling noted on the dorsal aspect of left wrist.  Patient able to flex/extend/ulnar and radial deviate wrist without difficulty but elicits pain.  She is able to make okay sign, fist, thumbs up, resist horizontal adduction of digits, oppose thumb.  She is tender to palpation of dorsal aspect of metacarpal bones as well as carpal bones and left hand.  She has full active range of motion of elbow and shoulder on the left side without eliciting pain.  Patient has baseline numbness in the palmar aspect of left hand and says it is unchanged.  She has no sensory deficits on the dorsal aspect of left upper extremity digits.  Radial pulses full and intact bilaterally.  No obvious breaks in the skin.  No signs of erythema, fluctuance, indurated tissue.  No snuffbox tenderness or pain with axial loading of the thumb.  Skin:    General: Skin is warm and dry.     Capillary Refill: Capillary refill takes less than 2 seconds.  Neurological:     Mental Status: She is alert.  Psychiatric:        Mood and Affect: Mood normal.     ED Results / Procedures / Treatments   Labs (all labs ordered are listed, but only abnormal results are displayed) Labs Reviewed - No data to display  EKG None  Radiology DG Hand 2 View Left  Result Date: 02/09/2022 CLINICAL DATA:  Injury. EXAM: LEFT HAND - 2 VIEW COMPARISON:  None Available. FINDINGS: There is  no evidence of fracture or dislocation. There is no evidence of arthropathy or other focal bone abnormality. There is soft tissue swelling of the dorsum of the hand. IMPRESSION: 1. Soft tissue swelling over the dorsum of the hand. 2. No acute bony abnormality. Electronically Signed   By: Darliss Cheney M.D.   On: 02/09/2022 18:56    Procedures Procedures    Medications Ordered in ED Medications - No data to display  ED Course/ Medical Decision Making/ A&P                           Medical  Decision Making Amount and/or Complexity of Data Reviewed Radiology: ordered.   This patient presents to the ED for concern of left wrist pain, this involves an extensive number of treatment options, and is a complaint that carries with it a high risk of complications and morbidity.  The differential diagnosis includes fracture, compartment syndrome, Kienbock disease, strain/sprain, osteomyelitis, gout, rheumatoid arthritis, cellulitis/erysipelas, flexor tenosynovitis   Co morbidities that complicate the patient evaluation  See HPI   Additional history obtained:  Additional history obtained from EMR External records from outside source obtained and reviewed including left wrist x-ray from 2020 which was negative for any acute abnormality   Lab Tests:  N/a   Imaging Studies ordered:  I ordered imaging studies including left wrist x-ray I independently visualized and interpreted imaging which showed no acute bony abnormality. I agree with the radiologist interpretation   Cardiac Monitoring: / EKG:  The patient was maintained on a cardiac monitor.  I personally viewed and interpreted the cardiac monitored which showed an underlying rhythm of: Sinus rhythm   Consultations Obtained:  N/a   Problem List / ED Course / Critical interventions / Medication management  Left wrist pain Reevaluation of the patient after these medicines showed that the patient improved I have reviewed the  patients home medicines and have made adjustments as needed   Social Determinants of Health:  Current tobacco use.  Denies illicit drug use.   Test / Admission - Considered:  Left wrist pain Vitals signs significant for hypertension with blood pressure 139/100.  Recommend close follow-up with PCP regarding elevated blood pressure.. Otherwise within normal range and stable throughout visit. Laboratory/imaging studies significant for: See above Patient's symptoms most likely secondary to wrist strain/sprain given mechanism of injury.  X-rays negative for any acute abnormality.  Patient will be placed in a left wrist brace and educated to keep wrist brace on until able to follow-up with hand surgery as soon as she is able to make an appointment.  Symptomatic therapy with rest, ice, elevation, NSAIDs recommended.  Doubt necrotizing fasciitis.  Doubt osteomyelitis.  Doubt cellulitis. Worrisome signs and symptoms were discussed with the patient, and the patient acknowledged understanding to return to the ED if noticed. Patient was stable upon discharge.         Final Clinical Impression(s) / ED Diagnoses Final diagnoses:  Left wrist pain    Rx / DC Orders ED Discharge Orders     None         Peter Garter, [place] 02/09/22 1946    Lonell Grandchild, MD 02/10/22 0030

## 2022-02-09 NOTE — Discharge Instructions (Addendum)
Were recently able to make an appointment with your hand surgeon of choice.  You can take your at home ibuprofen 600 mg every 4-6 hours for the next 5 to 7 days given that you declined a prescription today.  Continue to rest, ice, elevate affected wrist when you are able.  I have attached information for hand specialist in the area to follow-up with if you cannot get in with your preferred specialist.  Please do not hesitate to return to the emergency department if the worrisome signs and symptoms we discussed become apparent.

## 2022-02-09 NOTE — ED Notes (Signed)
Ortho tech called to place a splint on the patients  lt wrist

## 2022-02-09 NOTE — Progress Notes (Signed)
Orthopedic Tech Progress Note Patient Details:  [name] 1986-05-04 [id number]  Ortho Devices Type of Ortho Device: Velcro wrist splint Ortho Device/Splint Location: lue Ortho Device/Splint Interventions: Ordered, Application, Adjustment   Post Interventions Patient Tolerated: Well Instructions Provided: Care of device, Adjustment of device  Trinna Post 02/09/2022, 8:38 PM

## 2022-07-08 ENCOUNTER — Encounter (HOSPITAL_COMMUNITY): Payer: Self-pay | Admitting: Emergency Medicine

## 2022-07-08 ENCOUNTER — Emergency Department (HOSPITAL_COMMUNITY)
Admission: EM | Admit: 2022-07-08 | Discharge: 2022-07-09 | Disposition: A | Discharge status: Home / Self Care | Payer: Medicaid Other | Attending: Emergency Medicine | Admitting: Emergency Medicine

## 2022-07-08 ENCOUNTER — Emergency Department (HOSPITAL_COMMUNITY): Payer: Medicaid Other

## 2022-07-08 ENCOUNTER — Other Ambulatory Visit: Payer: Self-pay

## 2022-07-08 DIAGNOSIS — M7989 Other specified soft tissue disorders: Secondary | ICD-10-CM | POA: Diagnosis not present

## 2022-07-08 DIAGNOSIS — Y93H3 Activity, building and construction: Secondary | ICD-10-CM | POA: Insufficient documentation

## 2022-07-08 DIAGNOSIS — Y990 Civilian activity done for income or pay: Secondary | ICD-10-CM | POA: Diagnosis not present

## 2022-07-08 DIAGNOSIS — S92354A Nondisplaced fracture of fifth metatarsal bone, right foot, initial encounter for closed fracture: Secondary | ICD-10-CM

## 2022-07-08 DIAGNOSIS — M25571 Pain in right ankle and joints of right foot: Secondary | ICD-10-CM | POA: Diagnosis present

## 2022-07-08 DIAGNOSIS — X58XXXA Exposure to other specified factors, initial encounter: Secondary | ICD-10-CM | POA: Insufficient documentation

## 2022-07-08 NOTE — ED Provider Triage Note (Signed)
Emergency Medicine Provider Triage Evaluation Note  Brad Mcgaughy , a 36 y.o. female  was evaluated in triage.  Pt complains of right-sided foot pain for the past 3 days.  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  She states this got better over time.  She went to bed and woke up the next morning with severe pain in the right foot.  Pain is in the top of the foot.  She has associated swelling in the top of the foot and ankle region.  No known trauma or injury.  She states the pain is much worse with movement.  Review of Systems  Positive: As above Negative: As above  Physical Exam  BP (!) 154/98 (BP Location: Left Arm)   Pulse 100   Temp 98.1 F (36.7 C)   Resp 16   SpO2 99%  Gen:   Awake, no distress   Resp:  Normal effort  MSK:   Any movement of the right lower extremity causes pain in the foot.  Swelling in the top of the right foot into the ankle region noted. Other:    Medical Decision Making  Medically screening exam initiated at 8:51 PM.  Appropriate orders placed.  Harrietta Guardian was informed that the remainder of the evaluation will be completed by another provider, this initial triage assessment does not replace that evaluation, and the importance of remaining in the ED until their evaluation is complete.     Darrick Grinder, PA-C 07/08/22 2052

## 2022-07-08 NOTE — ED Triage Notes (Signed)
Foot pain and swelling up into calf without redness x 3 days, reduced ROM noted by patient.

## 2022-07-09 ENCOUNTER — Emergency Department (HOSPITAL_BASED_OUTPATIENT_CLINIC_OR_DEPARTMENT_OTHER): Payer: Medicaid Other

## 2022-07-09 DIAGNOSIS — R52 Pain, unspecified: Secondary | ICD-10-CM

## 2022-07-09 DIAGNOSIS — M7989 Other specified soft tissue disorders: Secondary | ICD-10-CM

## 2022-07-09 MED ORDER — OXYCODONE HCL 5 MG PO TABS
5.0000 mg | ORAL_TABLET | Freq: Once | ORAL | Status: AC
  Administered 2022-07-09: 5 mg via ORAL
  Filled 2022-07-09: qty 1

## 2022-07-09 MED ORDER — IBUPROFEN 800 MG PO TABS
800.0000 mg | ORAL_TABLET | Freq: Once | ORAL | Status: AC
  Administered 2022-07-09: 800 mg via ORAL
  Filled 2022-07-09: qty 1

## 2022-07-09 NOTE — ED Notes (Signed)
Patient transported to Vascular 

## 2022-07-09 NOTE — ED Notes (Signed)
Discharge instructions reviewed with patient and family. Patient denies any questions or concerns at this time. Patient out to lobby via wheelchair, understands need for follow-up care.

## 2022-07-09 NOTE — Progress Notes (Signed)
Lower extremity venous right study completed.  Preliminary results relayed to Rancour, MD.   See CV Proc for preliminary results report.   Jaliyah Fotheringham, RDMS, RVT  

## 2022-07-09 NOTE — Discharge Instructions (Signed)
Keep leg elevated and immobilized in the fracture boot.  Follow-up with your orthopedic doctor.  Return to the ED with worsening pain, weakness, numbness, tingling or other concerns.

## 2022-07-09 NOTE — ED Provider Notes (Signed)
[facility] EMERGENCY DEPARTMENT Provider Note   CSN: [id number] Arrival date & time: 07/08/22  1906     History  No chief complaint on file.   Athaleen Gallen is a 36 y.o. female.  4 days of right foot and ankle pain.  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  She jumped up and down to make the cramps get better and then the next day she developed severe pain to her right dorsal and lateral foot.  Pain radiates up to her ankle and lower leg.  Worse with movement.  Taking oxycodone which she takes chronically for her back without relief.  Denies any redness, fever, breaks in the skin, nausea, vomiting, focal weakness, numbness or tingling.  States the pain is progressively worsening and spreading up to her leg and causing some cramping to her lower leg and calf.  No chest pain or shortness of breath.  No history of VTE. No history of gout or arthritis.  The history is provided by the patient.       Home Medications Prior to Admission medications   Medication Sig Start Date End Date Taking? Authorizing Provider  albuterol (PROVENTIL HFA;VENTOLIN HFA) 108 (90 Base) MCG/ACT inhaler Inhale 2 puffs into the lungs every 4 (four) hours as needed for wheezing or shortness of breath. 04/04/16   [place], Lauralyn Primes, DO  beclomethasone (QVAR) 80 MCG/ACT inhaler Inhale 2 puffs into the lungs 2 (two) times daily. 04/18/16   Javier Glazier, MD  cyanocobalamin (,VITAMIN B-12,) 1000 MCG/ML injection Inject 105ml daily x 6 days, inject 68ml once weekly x 4 weeks, then inject 57ml monthly x 10 months thereafter. Patient not taking: Reported on 11/23/2020 08/21/15   Marcial Pacas, MD  enoxaparin (LOVENOX) 120 MG/0.8ML injection Inject 0.8 mLs (120 mg total) into the skin every 12 (twelve) hours for 14 days. 11/26/20 12/10/20  Constant, Peggy, MD  hydrochlorothiazide  (HYDRODIURIL) 25 MG tablet Take 25 mg by mouth daily. 11/19/20   [provider]  HYDROmorphone (DILAUDID) 2 MG tablet Take 1 tablet (2 mg total) by mouth every 4 (four) hours as needed for severe pain. 11/25/20   Constant, Peggy, MD  ibuprofen (ADVIL) 800 MG tablet Take 1 tablet (800 mg total) by mouth every 8 (eight) hours. 11/25/20   Constant, Peggy, MD  metroNIDAZOLE (FLAGYL) 500 MG tablet Take 1 tablet (500 mg total) by mouth 2 (two) times daily. 11/25/20   Constant, Peggy, MD  nicotine (NICODERM CQ - DOSED IN MG/24 HOURS) 14 mg/24hr patch Place 1 patch (14 mg total) onto the skin daily. 11/26/20   Constant, Peggy, MD  NIFEdipine (ADALAT CC) 30 MG 24 hr tablet Take 1 tablet (30 mg total) by mouth daily. 11/26/20   Constant, Peggy, MD  nortriptyline (PAMELOR) 50 MG capsule Take 50 mg by mouth at bedtime as needed (sleep). 11/19/20   [provider]  phentermine 37.5 MG capsule Take 37.5 mg by mouth daily.    [provider]  Prenatal Multivit-Min-Fe-FA (PRENATAL VITAMINS) 0.8 MG tablet Take 1 tablet by mouth daily. Patient not taking: Reported on 11/23/2020 02/04/16   Tamala Julian, [place], CNM  Vitamin D, Ergocalciferol, (DRISDOL) 1.25 MG (50000 UNIT) CAPS capsule Take 50,000 Units by mouth once a week. Wednesdays 11/19/20   [provider]      Allergies    Bee venom, Ultram [  tramadol hcl], Duloxetine hcl, Magnesium-containing compounds, and Compazine [prochlorperazine edisylate]    Review of Systems   Review of Systems  Constitutional:  Negative for activity change, appetite change and fever.  HENT:  Negative for congestion and rhinorrhea.   Respiratory:  Negative for cough, chest tightness and shortness of breath.   Cardiovascular:  Negative for chest pain.  Gastrointestinal:  Negative for abdominal pain, nausea and vomiting.  Genitourinary:  Negative for dysuria.  Musculoskeletal:  Positive for arthralgias and myalgias.  Skin:  Negative for rash.  Neurological:   Negative for dizziness, weakness and headaches.   all other systems are negative except as noted in the HPI and PMH.    Physical Exam Updated Vital Signs BP (!) 148/90 (BP Location: Left Arm)   Pulse 76   Temp 98.1 F (36.7 C) (Oral)   Resp 15   Wt 93.4 kg   SpO2 98%   BMI 33.25 kg/m  Physical Exam Vitals and nursing note reviewed.  Constitutional:      General: She is not in acute distress.    Appearance: She is well-developed. She is not ill-appearing.  HENT:     Head: Normocephalic and atraumatic.     Mouth/Throat:     Pharynx: No oropharyngeal exudate.  Eyes:     Conjunctiva/sclera: Conjunctivae normal.     Pupils: Pupils are equal, round, and reactive to light.  Neck:     Comments: No meningismus. Cardiovascular:     Rate and Rhythm: Normal rate and regular rhythm.     Heart sounds: Normal heart sounds. No murmur heard. Pulmonary:     Effort: Pulmonary effort is normal. No respiratory distress.     Breath sounds: Normal breath sounds.  Chest:     Chest wall: No tenderness.  Abdominal:     Palpations: Abdomen is soft.     Tenderness: There is no abdominal tenderness. There is no guarding or rebound.  Musculoskeletal:        General: Swelling and tenderness present.     Cervical back: Normal range of motion and neck supple.     Comments: Diffuse swelling of right dorsal foot and ankle without erythema or warmth.  Reduced range of motion due to pain.  Intact DP and PT pulses.  No calf asymmetry or swelling.  Skin:    General: Skin is warm.  Neurological:     Mental Status: She is alert and oriented to person, place, and time.     Cranial Nerves: No cranial nerve deficit.     Motor: No abnormal muscle tone.     Coordination: Coordination normal.     Comments:  5/5 strength throughout. CN 2-12 intact.Equal grip strength.   Psychiatric:        Behavior: Behavior normal.     ED Results / Procedures / Treatments   Labs (all labs ordered are listed, but only  abnormal results are displayed) Labs Reviewed - No data to display  EKG None  Radiology VAS [place] LOWER EXTREMITY VENOUS (DVT) (ONLY [facility] & [facility])  Result Date: 07/09/2022  Lower Venous DVT Study Patient Name:  MEYLIN VISONE  Date of Exam:   07/09/2022 Medical Rec #: [id number]                 Accession #:    [id number] Date of Birth: 09-27-1985                 Patient Gender: F Patient Age:   52 years  Exam Location:  [facility] Procedure:      VAS [place] LOWER EXTREMITY VENOUS (DVT) Referring Phys: Glynn Octave --------------------------------------------------------------------------------  Indications: Pain, and Swelling.  Comparison Study: 07-08-2022 X-Ray of right ankle revealed small osseous                   fragment lateral to the base of the fifth metatarsal                   suspicious for acute fracture. Performing Technologist: Jean Rosenthal RDMS, RVT  Examination Guidelines: A complete evaluation includes B-mode imaging, spectral Doppler, color Doppler, and power Doppler as needed of all accessible portions of each vessel. Bilateral testing is considered an integral part of a complete examination. Limited examinations for reoccurring indications may be performed as noted. The reflux portion of the exam is performed with the patient in reverse Trendelenburg.  +---------+---------------+---------+-----------+----------+--------------+ RIGHT    CompressibilityPhasicitySpontaneityPropertiesThrombus Aging +---------+---------------+---------+-----------+----------+--------------+ CFV      Full           Yes      Yes                                 +---------+---------------+---------+-----------+----------+--------------+ SFJ      Full                                                        +---------+---------------+---------+-----------+----------+--------------+ FV Prox  Full                                                         +---------+---------------+---------+-----------+----------+--------------+ FV Mid   Full                                                        +---------+---------------+---------+-----------+----------+--------------+ FV DistalFull                                                        +---------+---------------+---------+-----------+----------+--------------+ PFV      Full                                                        +---------+---------------+---------+-----------+----------+--------------+ POP      Full           Yes      Yes                                 +---------+---------------+---------+-----------+----------+--------------+ PTV      Full                                                        +---------+---------------+---------+-----------+----------+--------------+  PERO     Full                                                        +---------+---------------+---------+-----------+----------+--------------+ Gastroc  Full                                                        +---------+---------------+---------+-----------+----------+--------------+   +----+---------------+---------+-----------+----------+--------------+ LEFTCompressibilityPhasicitySpontaneityPropertiesThrombus Aging +----+---------------+---------+-----------+----------+--------------+ CFV Full           Yes      Yes                                 +----+---------------+---------+-----------+----------+--------------+     Summary: RIGHT: - There is no evidence of deep vein thrombosis in the lower extremity.  - No cystic structure found in the popliteal fossa.  LEFT: - No evidence of common femoral vein obstruction.  *See table(s) above for measurements and observations.    Preliminary    DG Foot Complete Right  Result Date: 07/08/2022 CLINICAL DATA:  Right foot and ankle pain and swelling after fall 3 days ago. Pain anterior foot and lateral side EXAM:  RIGHT ANKLE - COMPLETE 3+ VIEW; RIGHT FOOT COMPLETE - 3+ VIEW COMPARISON:  Radiographs 07/18/2005 FINDINGS: Small osseous fragment lateral to the base of the fifth metatarsal suspicious for acute fracture. Calcaneal spurs. Mild soft tissue swelling about the foot. IMPRESSION: Suspected acute avulsion fracture along the lateral base of the fifth metatarsal. Electronically Signed   By: Placido Sou M.D.   On: 07/08/2022 21:42   DG Ankle Complete Right  Result Date: 07/08/2022 CLINICAL DATA:  Right foot and ankle pain and swelling after fall 3 days ago. Pain anterior foot and lateral side EXAM: RIGHT ANKLE - COMPLETE 3+ VIEW; RIGHT FOOT COMPLETE - 3+ VIEW COMPARISON:  Radiographs 07/18/2005 FINDINGS: Small osseous fragment lateral to the base of the fifth metatarsal suspicious for acute fracture. Calcaneal spurs. Mild soft tissue swelling about the foot. IMPRESSION: Suspected acute avulsion fracture along the lateral base of the fifth metatarsal. Electronically Signed   By: Placido Sou M.D.   On: 07/08/2022 21:42    Procedures Procedures    Medications Ordered in ED Medications  ibuprofen (ADVIL) tablet 800 mg (has no administration in time range)  oxyCODONE (Oxy IR/ROXICODONE) immediate release tablet 5 mg (has no administration in time range)    ED Course/ Medical Decision Making/ A&P                           Medical Decision Making Amount and/or Complexity of Data Reviewed Labs: ordered. Decision-making details documented in ED Course. Radiology: ordered and independent interpretation performed. Decision-making details documented in ED Course. ECG/medicine tests: ordered and independent interpretation performed. Decision-making details documented in ED Course.  Risk Prescription drug management.  4 days of right ankle pain and foot pain with swelling extending up to her calf.  There is no overlying warmth or erythema.  Intact distal pulses.  X-rays obtained in triage are  concerning for avulsion fracture of base of fifth metatarsal.  Results reviewed interpreted by me  Lower extremity Doppler was obtained given her leg swelling and calf tenderness.  This is negative for DVT.  Will treat with immobilization for her metatarsal avulsion fracture with cam walker boot and crutches and pain control.  Follow-up with orthopedics.  Discussed rest, ice, elevation and orthopedic follow-up.  Return precautions discussed.       Final Clinical Impression(s) / ED Diagnoses Final diagnoses:  None    Rx / DC Orders ED Discharge Orders     None         Jebediah Macrae, Annie Main, MD 07/09/22 1149

## 2022-07-10 ENCOUNTER — Telehealth: Payer: Self-pay | Admitting: Radiology

## 2022-07-10 NOTE — Telephone Encounter (Signed)
Appt scheduled

## 2022-07-10 NOTE — Telephone Encounter (Signed)
Patient left voicemail on triage line that she was seen in ED 07/08/2022 and was told to follow up with Dr. August Saucer on 07/11/2022. She has possible avulsion fracture right foot.  She requests call to schedule appointment. [phone]

## 2022-07-15 ENCOUNTER — Ambulatory Visit: Payer: Medicaid Other | Admitting: Orthopedic Surgery

## 2022-07-15 ENCOUNTER — Telehealth: Payer: Self-pay | Admitting: Orthopedic Surgery

## 2022-07-15 NOTE — Telephone Encounter (Signed)
Patient had to cancel appt sue to transportation issues, she is wanting to see someone asap due to pain and other issues but had no transportation today. Please advise.. patient is very upset..[phone]

## 2022-07-17 ENCOUNTER — Ambulatory Visit (INDEPENDENT_AMBULATORY_CARE_PROVIDER_SITE_OTHER): Payer: Medicaid Other

## 2022-07-17 ENCOUNTER — Ambulatory Visit (INDEPENDENT_AMBULATORY_CARE_PROVIDER_SITE_OTHER): Payer: Medicaid Other | Admitting: Physician Assistant

## 2022-07-17 DIAGNOSIS — M79671 Pain in right foot: Secondary | ICD-10-CM
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
## 2022-07-31 ENCOUNTER — Encounter: Payer: Self-pay | Admitting: Orthopedic Surgery

## 2022-07-31 ENCOUNTER — Ambulatory Visit (INDEPENDENT_AMBULATORY_CARE_PROVIDER_SITE_OTHER): Payer: Medicaid Other | Admitting: Orthopedic Surgery

## 2022-07-31 DIAGNOSIS — M79671 Pain in right foot: Secondary | ICD-10-CM | POA: Diagnosis not present

## 2022-07-31 DIAGNOSIS — M6701 Short Achilles tendon (acquired), right ankle: Secondary | ICD-10-CM
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
## 2022-08-01 ENCOUNTER — Ambulatory Visit: Payer: Medicaid Other | Admitting: Orthopedic Surgery

## 2023-08-01 ENCOUNTER — Encounter (HOSPITAL_BASED_OUTPATIENT_CLINIC_OR_DEPARTMENT_OTHER): Payer: Self-pay | Admitting: Emergency Medicine

## 2023-08-01 ENCOUNTER — Other Ambulatory Visit: Payer: Self-pay

## 2023-08-01 ENCOUNTER — Emergency Department (HOSPITAL_BASED_OUTPATIENT_CLINIC_OR_DEPARTMENT_OTHER)
Admission: EM | Admit: 2023-08-01 | Discharge: 2023-08-02 | Disposition: A | Discharge status: Home / Self Care | Payer: Medicaid Other | Attending: Emergency Medicine | Admitting: Emergency Medicine

## 2023-08-01 ENCOUNTER — Emergency Department (HOSPITAL_BASED_OUTPATIENT_CLINIC_OR_DEPARTMENT_OTHER): Payer: Medicaid Other

## 2023-08-01 DIAGNOSIS — Z7951 Long term (current) use of inhaled steroids: Secondary | ICD-10-CM | POA: Diagnosis not present

## 2023-08-01 DIAGNOSIS — J45909 Unspecified asthma, uncomplicated: Secondary | ICD-10-CM | POA: Diagnosis not present

## 2023-08-01 DIAGNOSIS — R1031 Right lower quadrant pain: Secondary | ICD-10-CM | POA: Insufficient documentation

## 2023-08-01 DIAGNOSIS — Z79899 Other long term (current) drug therapy: Secondary | ICD-10-CM | POA: Insufficient documentation

## 2023-08-01 DIAGNOSIS — I10 Essential (primary) hypertension: Secondary | ICD-10-CM | POA: Diagnosis not present

## 2023-08-01 LAB — COMPREHENSIVE METABOLIC PANEL
ALT: 16 U/L (ref 0–44)
AST: 18 U/L (ref 15–41)
Albumin: 3.5 g/dL (ref 3.5–5.0)
Alkaline Phosphatase: 77 U/L (ref 38–126)
Anion gap: 8 (ref 5–15)
BUN: 8 mg/dL (ref 6–20)
CO2: 26 mmol/L (ref 22–32)
Calcium: 9.1 mg/dL (ref 8.9–10.3)
Chloride: 100 mmol/L (ref 98–111)
Creatinine, Ser: 0.71 mg/dL (ref 0.44–1.00)
GFR, Estimated: >60 mL/min (ref >60)
Glucose, Bld: 102 mg/dL — ABNORMAL HIGH (ref 70–99)
Potassium: 2.9 mmol/L — ABNORMAL LOW (ref 3.5–5.1)
Sodium: 134 mmol/L — ABNORMAL LOW (ref 135–145)
Total Bilirubin: 0.4 mg/dL (ref 0.0–1.2)
Total Protein: 7.3 g/dL (ref 6.5–8.1)

## 2023-08-01 LAB — URINALYSIS, ROUTINE W REFLEX MICROSCOPIC
Bilirubin Urine: NEGATIVE
Glucose, UA: NEGATIVE mg/dL
Hgb urine dipstick: NEGATIVE
Ketones, ur: NEGATIVE mg/dL
Leukocytes,Ua: NEGATIVE
Nitrite: NEGATIVE
Protein, ur: NEGATIVE mg/dL
Specific Gravity, Urine: 1.015 (ref 1.005–1.030)
pH: 7 (ref 5.0–8.0)

## 2023-08-01 LAB — CBC WITH DIFFERENTIAL/PLATELET
Abs Immature Granulocytes: 0.04 10*3/uL (ref 0.00–0.07)
Basophils Absolute: 0.1 10*3/uL (ref 0.0–0.1)
Basophils Relative: 0 %
Eosinophils Absolute: 0.6 10*3/uL — ABNORMAL HIGH (ref 0.0–0.5)
Eosinophils Relative: 5 %
HCT: 41.1 % (ref 36.0–46.0)
Hemoglobin: 14.2 g/dL (ref 12.0–15.0)
Immature Granulocytes: 0 %
Lymphocytes Relative: 31 %
Lymphs Abs: 4 10*3/uL (ref 0.7–4.0)
MCH: 27.8 pg (ref 26.0–34.0)
MCHC: 34.5 g/dL (ref 30.0–36.0)
MCV: 80.6 fL (ref 80.0–100.0)
Monocytes Absolute: 0.7 10*3/uL (ref 0.1–1.0)
Monocytes Relative: 5 %
Neutro Abs: 7.8 10*3/uL — ABNORMAL HIGH (ref 1.7–7.7)
Neutrophils Relative %: 59 %
Platelets: 192 10*3/uL (ref 150–400)
RBC: 5.1 MIL/uL (ref 3.87–5.11)
RDW: 12.7 % (ref 11.5–15.5)
WBC: 13.2 10*3/uL — ABNORMAL HIGH (ref 4.0–10.5)
nRBC: 0 % (ref 0.0–0.2)

## 2023-08-01 LAB — LIPASE, BLOOD: Lipase: 28 U/L (ref 11–51)

## 2023-08-01 LAB — PREGNANCY, URINE: Preg Test, Ur: NEGATIVE

## 2023-08-01 MED ORDER — FENTANYL CITRATE PF 50 MCG/ML IJ SOSY
50.0000 ug | PREFILLED_SYRINGE | Freq: Once | INTRAMUSCULAR | Status: AC
  Administered 2023-08-01: 50 ug via INTRAVENOUS
  Filled 2023-08-01: qty 1

## 2023-08-01 MED ORDER — ONDANSETRON HCL 4 MG/2ML IJ SOLN
4.0000 mg | Freq: Once | INTRAMUSCULAR | Status: AC
  Administered 2023-08-01: 4 mg via INTRAVENOUS
  Filled 2023-08-01: qty 2

## 2023-08-01 MED ORDER — SODIUM CHLORIDE 0.9 % IV BOLUS
1000.0000 mL | Freq: Once | INTRAVENOUS | Status: AC
  Administered 2023-08-01: 1000 mL via INTRAVENOUS

## 2023-08-01 MED ORDER — IOHEXOL 300 MG/ML  SOLN
100.0000 mL | Freq: Once | INTRAMUSCULAR | Status: AC | PRN
  Administered 2023-08-02: 100 mL via INTRAVENOUS

## 2023-08-01 NOTE — ED Provider Notes (Incomplete)
[place] EMERGENCY DEPARTMENT AT [facility] Provider Note   CSN: [id number] Arrival date & time: 08/01/23  2144     History  Chief Complaint  Patient presents with   Abdominal Pain    [name] is a 38 y.o. female.  Patient is a 38 year old female with past medical history of hypertension, asthma, GERD, prior cholecystectomy and hernia repair.  Patient presenting today with complaints of right lower quadrant pain.  Pain has been ongoing for the past 4 days.  This is constant with no alleviating factors.  Pain is worse when she palpates the area or moves.  She reports some decreased appetite, but no diarrhea or constipation.  No vomiting.  No fevers or chills.  The history is provided by the patient.       Home Medications Prior to Admission medications   Medication Sig Start Date End Date Taking? Authorizing Provider  albuterol (PROVENTIL HFA;VENTOLIN HFA) 108 (90 Base) MCG/ACT inhaler Inhale 2 puffs into the lungs every 4 (four) hours as needed for wheezing or shortness of breath. 04/04/16   Mumaw, Hiram Comber, DO  beclomethasone (QVAR) 80 MCG/ACT inhaler Inhale 2 puffs into the lungs 2 (two) times daily. 04/18/16   Roslynn Amble, MD  cyanocobalamin (,VITAMIN B-12,) 1000 MCG/ML injection Inject 1ml daily x 6 days, inject 1ml once weekly x 4 weeks, then inject 1ml monthly x 10 months thereafter. Patient not taking: Reported on 11/23/2020 08/21/15   Levert Feinstein, MD  enoxaparin (LOVENOX) 120 MG/0.8ML injection Inject 0.8 mLs (120 mg total) into the skin every 12 (twelve) hours for 14 days. 11/26/20 12/10/20  Constant, Peggy, MD  hydrochlorothiazide (HYDRODIURIL) 25 MG tablet Take 25 mg by mouth daily. 11/19/20   [provider]  HYDROmorphone (DILAUDID) 2 MG tablet Take 1 tablet (2 mg total) by mouth every 4 (four) hours as needed for severe pain. 11/25/20   Constant, Peggy, MD  ibuprofen (ADVIL) 800 MG tablet Take 1 tablet (800 mg total) by  mouth every 8 (eight) hours. 11/25/20   Constant, Peggy, MD  metroNIDAZOLE (FLAGYL) 500 MG tablet Take 1 tablet (500 mg total) by mouth 2 (two) times daily. 11/25/20   Constant, Peggy, MD  nicotine (NICODERM CQ - DOSED IN MG/24 HOURS) 14 mg/24hr patch Place 1 patch (14 mg total) onto the skin daily. 11/26/20   Constant, Peggy, MD  NIFEdipine (ADALAT CC) 30 MG 24 hr tablet Take 1 tablet (30 mg total) by mouth daily. 11/26/20   Constant, Peggy, MD  nortriptyline (PAMELOR) 50 MG capsule Take 50 mg by mouth at bedtime as needed (sleep). 11/19/20   [provider]  phentermine 37.5 MG capsule Take 37.5 mg by mouth daily.    [provider]  Prenatal Multivit-Min-Fe-FA (PRENATAL VITAMINS) 0.8 MG tablet Take 1 tablet by mouth daily. Patient not taking: Reported on 11/23/2020 02/04/16   Katrinka Blazing, [place], CNM  Vitamin D, Ergocalciferol, (DRISDOL) 1.25 MG (50000 UNIT) CAPS capsule Take 50,000 Units by mouth once a week. Wednesdays 11/19/20   [provider]      Allergies    Bee venom, Ultram [tramadol hcl], Duloxetine hcl, Magnesium-containing compounds, and Compazine [prochlorperazine edisylate]    Review of Systems   Review of Systems  All other systems reviewed and are negative.   Physical Exam Updated Vital Signs BP (!) 161/87 (BP Location: Left Arm)   Pulse 94   Temp 98.2 F (36.8 C) (Oral)   Resp 18   Ht 5\' 6"  (1.676  m)   Wt 102.5 kg   LMP 07/21/2023   SpO2 98%   BMI 36.48 kg/m  Physical Exam Vitals and nursing note reviewed.  Constitutional:      General: She is not in acute distress.    Appearance: She is well-developed. She is not diaphoretic.  HENT:     Head: Normocephalic and atraumatic.  Cardiovascular:     Rate and Rhythm: Normal rate and regular rhythm.     Heart sounds: No murmur heard.    No friction rub. No gallop.  Pulmonary:     Effort: Pulmonary effort is normal. No respiratory distress.     Breath sounds: Normal breath sounds. No wheezing.   Abdominal:     General: Bowel sounds are normal. There is no distension.     Palpations: Abdomen is soft.     Tenderness: There is abdominal tenderness in the right lower quadrant. There is no right CVA tenderness, left CVA tenderness, guarding or rebound.  Musculoskeletal:        General: Normal range of motion.     Cervical back: Normal range of motion and neck supple.  Skin:    General: Skin is warm and dry.  Neurological:     General: No focal deficit present.     Mental Status: She is alert and oriented to person, place, and time.     ED Results / Procedures / Treatments   Labs (all labs ordered are listed, but only abnormal results are displayed) Labs Reviewed  COMPREHENSIVE METABOLIC PANEL - Abnormal; Notable for the following components:      Result Value   Sodium 134 (*)    Potassium 2.9 (*)    Glucose, Bld 102 (*)    All other components within normal limits  CBC WITH DIFFERENTIAL/PLATELET - Abnormal; Notable for the following components:   WBC 13.2 (*)    Neutro Abs 7.8 (*)    Eosinophils Absolute 0.6 (*)    All other components within normal limits  LIPASE, BLOOD  URINALYSIS, ROUTINE W REFLEX MICROSCOPIC  PREGNANCY, URINE    EKG None  Radiology No results found.  Procedures Procedures  {Document cardiac monitor, telemetry assessment procedure when appropriate:1}  Medications Ordered in ED Medications  sodium chloride 0.9 % bolus 1,000 mL (has no administration in time range)  ondansetron (ZOFRAN) injection 4 mg (has no administration in time range)  fentaNYL (SUBLIMAZE) injection 50 mcg (has no administration in time range)    ED Course/ Medical Decision Making/ A&P   {   Click here for ABCD2, HEART and other calculatorsREFRESH Note before signing :1}                              Medical Decision Making Amount and/or Complexity of Data Reviewed Labs: ordered. Radiology: ordered.  Risk Prescription drug management.   ***  {Document  critical care time when appropriate:1} {Document review of labs and clinical decision tools ie heart score, Chads2Vasc2 etc:1}  {Document your independent review of radiology images, and any outside records:1} {Document your discussion with family members, caretakers, and with consultants:1} {Document social determinants of health affecting pt's care:1} {Document your decision making why or why not admission, treatments were needed:1} Final Clinical Impression(s) / ED Diagnoses Final diagnoses:  None    Rx / DC Orders ED Discharge Orders     None

## 2023-08-01 NOTE — ED Triage Notes (Signed)
Pt c/o RLQ pain x 4d; +NVD; denies urinary sxs

## 2023-08-01 NOTE — ED Notes (Signed)
EDP at bedside  

## 2023-08-02 ENCOUNTER — Ambulatory Visit (HOSPITAL_BASED_OUTPATIENT_CLINIC_OR_DEPARTMENT_OTHER): Admit: 2023-08-02 | Payer: Medicaid Other

## 2023-08-02 MED ORDER — FENTANYL CITRATE PF 50 MCG/ML IJ SOSY
50.0000 ug | PREFILLED_SYRINGE | Freq: Once | INTRAMUSCULAR | Status: AC
Start: 2023-08-02 — End: 2023-08-02
  Administered 2023-08-02: 50 ug via INTRAVENOUS
  Filled 2023-08-02: qty 1

## 2023-08-02 NOTE — Discharge Instructions (Addendum)
Continue taking your home medications as needed for pain.  Return tomorrow at the given time for an ultrasound to further evaluate your right ovary.  Follow-up with primary doctor if not improving in the next few days.

## 2023-08-02 NOTE — ED Notes (Signed)
[place] scheduled for 1100 1/19

## 2023-08-04 ENCOUNTER — Telehealth (HOSPITAL_BASED_OUTPATIENT_CLINIC_OR_DEPARTMENT_OTHER): Payer: Self-pay

## 2023-08-12 ENCOUNTER — Telehealth (HOSPITAL_BASED_OUTPATIENT_CLINIC_OR_DEPARTMENT_OTHER): Payer: Self-pay

## 2023-11-12 ENCOUNTER — Emergency Department (HOSPITAL_BASED_OUTPATIENT_CLINIC_OR_DEPARTMENT_OTHER)
Admission: EM | Admit: 2023-11-12 | Discharge: 2023-11-12 | Disposition: A | Discharge status: Home / Self Care | Attending: Emergency Medicine | Admitting: Emergency Medicine

## 2023-11-12 ENCOUNTER — Encounter (HOSPITAL_BASED_OUTPATIENT_CLINIC_OR_DEPARTMENT_OTHER): Payer: Self-pay | Admitting: Emergency Medicine

## 2023-11-12 ENCOUNTER — Other Ambulatory Visit: Payer: Self-pay

## 2023-11-12 DIAGNOSIS — K047 Periapical abscess without sinus: Secondary | ICD-10-CM | POA: Insufficient documentation

## 2023-11-12 DIAGNOSIS — K0889 Other specified disorders of teeth and supporting structures: Secondary | ICD-10-CM | POA: Diagnosis present

## 2023-11-12 MED ORDER — CLINDAMYCIN HCL 300 MG PO CAPS
300.0000 mg | ORAL_CAPSULE | Freq: Four times a day (QID) | ORAL | 0 refills | Status: AC
Start: 2023-11-12 — End: ?

## 2023-11-12 MED ORDER — CLINDAMYCIN HCL 150 MG PO CAPS
300.0000 mg | ORAL_CAPSULE | Freq: Once | ORAL | Status: AC
  Administered 2023-11-12: 300 mg via ORAL
  Filled 2023-11-12: qty 2

## 2023-11-12 NOTE — ED Triage Notes (Signed)
 Pt presents with chronic tooth issues that she is usually able to control with orajel and saltwater warm gargles. She states this has not been helping. Takes oxy for back pain and ibuprofen  for pain. Left facial swelling. No difficulty breathing.

## 2023-11-12 NOTE — ED Provider Notes (Signed)
  EMERGENCY DEPARTMENT AT [facility] Provider Note   CSN: [id number] Arrival date & time: 11/12/23  0242     History  Chief Complaint  Patient presents with   Dental Problem    [name] is a 38 y.o. female.  Patient scented with complaints of dental pain and swelling of her left cheek.  This has been worsening over the past 2 days.  She has a history of caries and poor dentition.  No fevers or chills.  She has been taking her home ibuprofen  and oxycodone  with little relief.       Home Medications Prior to Admission medications   Medication Sig Start Date End Date Taking? Authorizing Provider  albuterol  (PROVENTIL  HFA;VENTOLIN  HFA) 108 (90 Base) MCG/ACT inhaler Inhale 2 puffs into the lungs every 4 (four) hours as needed for wheezing or shortness of breath. 04/04/16   Mumaw, Dalphine Duet, DO  beclomethasone (QVAR ) 80 MCG/ACT inhaler Inhale 2 puffs into the lungs 2 (two) times daily. 04/18/16   Samual Crochet, MD  cyanocobalamin  (,VITAMIN B-12,) 1000 MCG/ML injection Inject 1ml daily x 6 days, inject 1ml once weekly x 4 weeks, then inject 1ml monthly x 10 months thereafter. Patient not taking: Reported on 11/23/2020 08/21/15   Phebe Brasil, MD  enoxaparin  (LOVENOX ) 120 MG/0.8ML injection Inject 0.8 mLs (120 mg total) into the skin every 12 (twelve) hours for 14 days. 11/26/20 12/10/20  Constant, Peggy, MD  hydrochlorothiazide (HYDRODIURIL) 25 MG tablet Take 25 mg by mouth daily. 11/19/20   [provider]  HYDROmorphone  (DILAUDID ) 2 MG tablet Take 1 tablet (2 mg total) by mouth every 4 (four) hours as needed for severe pain. 11/25/20   Constant, Peggy, MD  ibuprofen  (ADVIL ) 800 MG tablet Take 1 tablet (800 mg total) by mouth every 8 (eight) hours. 11/25/20   Constant, Peggy, MD  metroNIDAZOLE  (FLAGYL ) 500 MG tablet Take 1 tablet (500 mg total) by mouth 2 (two) times daily. 11/25/20   Constant, Peggy, MD  nicotine  (NICODERM CQ  - DOSED IN MG/24  HOURS) 14 mg/24hr patch Place 1 patch (14 mg total) onto the skin daily. 11/26/20   Constant, Peggy, MD  NIFEdipine  (ADALAT  CC) 30 MG 24 hr tablet Take 1 tablet (30 mg total) by mouth daily. 11/26/20   Constant, Peggy, MD  nortriptyline  (PAMELOR ) 50 MG capsule Take 50 mg by mouth at bedtime as needed (sleep). 11/19/20   [provider]  phentermine 37.5 MG capsule Take 37.5 mg by mouth daily.    [provider]  Prenatal Multivit-Min-Fe-FA (PRENATAL VITAMINS) 0.8 MG tablet Take 1 tablet by mouth daily. Patient not taking: Reported on 11/23/2020 02/04/16   Smith, [place] , CNM  Vitamin D, Ergocalciferol, (DRISDOL) 1.25 MG (50000 UNIT) CAPS capsule Take 50,000 Units by mouth once a week. Wednesdays 11/19/20   [provider]      Allergies    Bee venom, Ultram [tramadol hcl], Duloxetine hcl, Magnesium-containing compounds, and Compazine [prochlorperazine edisylate]    Review of Systems   Review of Systems  All other systems reviewed and are negative.   Physical Exam Updated Vital Signs BP (!) 170/121 (BP Location: Right Arm)   Pulse 100   Temp 97.7 F (36.5 C) (Oral)   Resp 18   LMP 11/10/2023   SpO2 97%  Physical Exam Vitals and nursing note reviewed.  Constitutional:      Appearance: Normal appearance.  HENT:     Mouth/Throat:     Mouth: Mucous membranes are  moist.     Comments: Patient with multiple missing and heavily decayed teeth.  The left upper bicuspid is heavily decayed with surrounding gingival inflammation.  There is swelling noted of the soft tissues of the left cheek. Pulmonary:     Effort: Pulmonary effort is normal.  Neurological:     Mental Status: She is alert and oriented to person, place, and time.     ED Results / Procedures / Treatments   Labs (all labs ordered are listed, but only abnormal results are displayed) Labs Reviewed - No data to display  EKG None  Radiology No results found.  Procedures Procedures    Medications  Ordered in ED Medications  clindamycin  (CLEOCIN ) capsule 300 mg (has no administration in time range)    ED Course/ Medical Decision Making/ A&P  Dental infection related to dental caries/poor dentition.  Patient to be treated with clindamycin  and follow-up with dentistry.  Final Clinical Impression(s) / ED Diagnoses Final diagnoses:  None    Rx / DC Orders ED Discharge Orders     None         Orvilla Blander, MD 11/12/23 [id number]

## 2023-11-12 NOTE — Discharge Instructions (Signed)
 Begin taking clindamycin  as prescribed.  Continue taking ibuprofen  and oxycodone  as previously prescribed.  Follow-up with dentistry in the next few days.

## 2024-04-10 ENCOUNTER — Other Ambulatory Visit: Payer: Self-pay

## 2024-04-10 ENCOUNTER — Emergency Department (HOSPITAL_COMMUNITY)
Admission: EM | Admit: 2024-04-10 | Discharge: 2024-04-11 | Disposition: A | Attending: Emergency Medicine | Admitting: Emergency Medicine

## 2024-04-10 ENCOUNTER — Emergency Department (HOSPITAL_COMMUNITY)

## 2024-04-10 ENCOUNTER — Encounter (HOSPITAL_COMMUNITY): Payer: Self-pay | Admitting: *Deleted

## 2024-04-10 DIAGNOSIS — F17200 Nicotine dependence, unspecified, uncomplicated: Secondary | ICD-10-CM | POA: Diagnosis not present

## 2024-04-10 DIAGNOSIS — J4489 Other specified chronic obstructive pulmonary disease: Secondary | ICD-10-CM | POA: Diagnosis not present

## 2024-04-10 DIAGNOSIS — J180 Bronchopneumonia, unspecified organism: Secondary | ICD-10-CM | POA: Diagnosis not present

## 2024-04-10 DIAGNOSIS — R0602 Shortness of breath: Secondary | ICD-10-CM

## 2024-04-10 DIAGNOSIS — I10 Essential (primary) hypertension: Secondary | ICD-10-CM | POA: Diagnosis not present

## 2024-04-10 NOTE — ED Triage Notes (Signed)
 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

## 2024-04-11 ENCOUNTER — Emergency Department (HOSPITAL_COMMUNITY)

## 2024-04-11 LAB — CBC
HCT: 44.4 % (ref 36.0–46.0)
Hemoglobin: 15 g/dL (ref 12.0–15.0)
MCH: 28.5 pg (ref 26.0–34.0)
MCHC: 33.8 g/dL (ref 30.0–36.0)
MCV: 84.3 fL (ref 80.0–100.0)
Platelets: 201 K/uL (ref 150–400)
RBC: 5.27 MIL/uL — ABNORMAL HIGH (ref 3.87–5.11)
RDW: 12.6 % (ref 11.5–15.5)
WBC: 13.8 K/uL — ABNORMAL HIGH (ref 4.0–10.5)
nRBC: 0 % (ref 0.0–0.2)

## 2024-04-11 LAB — BASIC METABOLIC PANEL WITH GFR
Anion gap: 12 (ref 5–15)
BUN: 6 mg/dL (ref 6–20)
CO2: 24 mmol/L (ref 22–32)
Calcium: 8.7 mg/dL — ABNORMAL LOW (ref 8.9–10.3)
Chloride: 99 mmol/L (ref 98–111)
Creatinine, Ser: 0.71 mg/dL (ref 0.44–1.00)
GFR, Estimated: >60 mL/min (ref >60)
Glucose, Bld: 116 mg/dL — ABNORMAL HIGH (ref 70–99)
Potassium: 3.4 mmol/L — ABNORMAL LOW (ref 3.5–5.1)
Sodium: 135 mmol/L (ref 135–145)

## 2024-04-11 LAB — RESP PANEL BY RT-PCR (RSV, FLU A&B, COVID)  RVPGX2
Influenza A by PCR: NEGATIVE
Influenza B by PCR: NEGATIVE
Resp Syncytial Virus by PCR: NEGATIVE
SARS Coronavirus 2 by RT PCR: NEGATIVE

## 2024-04-11 LAB — D-DIMER, QUANTITATIVE: D-Dimer, Quant: 0.77 ug{FEU}/mL — ABNORMAL HIGH (ref 0.00–0.50)

## 2024-04-11 LAB — TROPONIN I (HIGH SENSITIVITY)
Troponin I (High Sensitivity): 6 ng/L (ref <18)
Troponin I (High Sensitivity): 7 ng/L (ref <18)

## 2024-04-11 LAB — HCG, SERUM, QUALITATIVE: Preg, Serum: NEGATIVE

## 2024-04-11 LAB — BRAIN NATRIURETIC PEPTIDE: B Natriuretic Peptide: 49.5 pg/mL (ref 0.0–100.0)

## 2024-04-11 MED ORDER — POTASSIUM CHLORIDE CRYS ER 20 MEQ PO TBCR
40.0000 meq | EXTENDED_RELEASE_TABLET | Freq: Once | ORAL | Status: AC
  Administered 2024-04-11: 40 meq via ORAL
  Filled 2024-04-11: qty 2

## 2024-04-11 MED ORDER — DOXYCYCLINE HYCLATE 100 MG PO CAPS: 100.0000 mg | ORAL_CAPSULE | Freq: Two times a day (BID) | ORAL | 0 refills | Status: AC

## 2024-04-11 MED ORDER — IPRATROPIUM-ALBUTEROL 0.5-2.5 (3) MG/3ML IN SOLN
3.0000 mL | Freq: Once | RESPIRATORY_TRACT | Status: AC
  Administered 2024-04-11: 3 mL via RESPIRATORY_TRACT
  Filled 2024-04-11: qty 3

## 2024-04-11 MED ORDER — DOXYCYCLINE HYCLATE 100 MG PO TABS
100.0000 mg | ORAL_TABLET | Freq: Once | ORAL | Status: AC
  Administered 2024-04-11: 100 mg via ORAL
  Filled 2024-04-11: qty 1

## 2024-04-11 MED ORDER — PREDNISONE 20 MG PO TABS: 40.0000 mg | ORAL_TABLET | Freq: Every day | ORAL | 0 refills | Status: DC

## 2024-04-11 MED ORDER — ALBUTEROL SULFATE HFA 108 (90 BASE) MCG/ACT IN AERS: 1.0000 | INHALATION_SPRAY | Freq: Four times a day (QID) | RESPIRATORY_TRACT | 0 refills | Status: AC | PRN

## 2024-04-11 MED ORDER — OXYCODONE-ACETAMINOPHEN 5-325 MG PO TABS
1.0000 | ORAL_TABLET | Freq: Once | ORAL | Status: AC
  Administered 2024-04-11: 1 via ORAL
  Filled 2024-04-11: qty 1

## 2024-04-11 MED ORDER — METOPROLOL TARTRATE 25 MG PO TABS
25.0000 mg | ORAL_TABLET | Freq: Once | ORAL | Status: AC
  Administered 2024-04-11: 25 mg via ORAL
  Filled 2024-04-11: qty 1

## 2024-04-11 MED ORDER — AZITHROMYCIN 250 MG PO TABS: ORAL_TABLET | ORAL | 0 refills | Status: AC

## 2024-04-11 MED ORDER — IOHEXOL 350 MG/ML SOLN
75.0000 mL | Freq: Once | INTRAVENOUS | Status: AC | PRN
Start: 2024-04-11 — End: 2024-04-11
  Administered 2024-04-11: 75 mL via INTRAVENOUS

## 2024-04-11 MED ORDER — METHYLPREDNISOLONE SODIUM SUCC 125 MG IJ SOLR
125.0000 mg | INTRAMUSCULAR | Status: AC
  Administered 2024-04-11: 125 mg via INTRAVENOUS
  Filled 2024-04-11: qty 2

## 2024-04-11 NOTE — ED Notes (Signed)
 Attempted to draw troponin from pt, unsuccessful at this time.

## 2024-04-11 NOTE — Discharge Instructions (Signed)
 It was a pleasure taking part in your care.  As discussed, it appears as if you have pneumonia based on your CT's imaging.  Please begin taking doxycycline  twice a day for 7 days.  Please begin taking azithromycin 500 mg day 1 followed by 250 mg day 2, 3, 4 and 5.  Please take prednisone  40 mg once a day for 4 days beginning on 9/30.  Please utilize albuterol  inhaler as needed.  As we discussed, I did offer admission but you report you wish to go home.  If you go home, continue to feel worse or become more short of breath, lightheaded, dizzy or weak when you walk please return to the ED for further care.  Please follow-up with your PCP in 1 week for reevaluation if you do not return to the ED.

## 2024-04-11 NOTE — ED Notes (Signed)
 Pt O2 sat 92-94% sitting, O2 sats remains same while walking, pt denies dizziness, weakness while ambulating, reports SOB improved after breathing treatment; EDP at bedside to discuss disposition; pt states she wished to be discharged home with abx, EDP advises O2 is borderline and advises if no improvement or worsening symptoms with outpatient treatment pt needs to return to ED; pt acknowledges understanding and agrees with POC

## 2024-04-11 NOTE — ED Provider Notes (Signed)
 [place] EMERGENCY DEPARTMENT AT [facility] Provider Note   CSN: [id number] Arrival date & time: 04/10/24  2246     Patient presents with: Shortness of Breath   [name] is a 38 y.o. female with history of migraines, hypertension, chronic back pain, continuous use of opioids, asthma, COPD, septic thrombophlebitis.  Patient presents to ED for concern of shortness of breath.  States that on Thursday she helped a friend clean her house with ammonia, bleach as well as isopropyl alcohol.  Reports that she mixed all these things and clean floors.  States that she became very short of breath during this and has had continued shortness of breath since Thursday.  She denies any chest pain.  She endorses lightheadedness on standing but denies dizziness or weakness.  She denies nausea, vomiting, fevers or abdominal pain.  She reports that she still smokes, last smoked on Thursday.  She arrives hypertensive and states that she has taken all blood pressure medication today but has not taken her metoprolol 25 mg.  She is tachycardic.  She has wheezing throughout.  Denies leg swelling.   Shortness of Breath Associated symptoms: wheezing        Prior to Admission medications   Medication Sig Start Date End Date Taking? Authorizing Provider  albuterol  (VENTOLIN  HFA) 108 (90 Base) MCG/ACT inhaler Inhale 1-2 puffs into the lungs every 6 (six) hours as needed for wheezing or shortness of breath. 04/11/24  Yes Ruthell Lonni FALCON, PA-C  azithromycin (ZITHROMAX) 250 MG tablet Take 2 tablets (500 mg total) by mouth daily for 1 day, THEN 1 tablet (250 mg total) daily for 4 days. Take first 2 tablets together, then 1 every day until finished. 04/11/24 04/16/24 Yes Ruthell Lonni FALCON, PA-C  doxycycline  (VIBRAMYCIN ) 100 MG capsule Take 1 capsule (100 mg total) by mouth 2 (two) times daily. 04/11/24  Yes Ruthell Lonni FALCON, PA-C  predniSONE  (DELTASONE ) 20 MG tablet Take 2 tablets (40 mg  total) by mouth daily. 04/11/24  Yes Ruthell Lonni FALCON, PA-C  beclomethasone (QVAR ) 80 MCG/ACT inhaler Inhale 2 puffs into the lungs 2 (two) times daily. 04/18/16   Noreen Tonnie BRAVO, MD  clindamycin  (CLEOCIN ) 300 MG capsule Take 1 capsule (300 mg total) by mouth 4 (four) times daily. X 7 days 11/12/23   Geroldine Berg, MD  cyanocobalamin  (,VITAMIN B-12,) 1000 MCG/ML injection Inject 1ml daily x 6 days, inject 1ml once weekly x 4 weeks, then inject 1ml monthly x 10 months thereafter. Patient not taking: Reported on 11/23/2020 08/21/15   Onita Duos, MD  enoxaparin  (LOVENOX ) 120 MG/0.8ML injection Inject 0.8 mLs (120 mg total) into the skin every 12 (twelve) hours for 14 days. 11/26/20 12/10/20  Constant, Peggy, MD  hydrochlorothiazide (HYDRODIURIL) 25 MG tablet Take 25 mg by mouth daily. 11/19/20   [provider]  HYDROmorphone  (DILAUDID ) 2 MG tablet Take 1 tablet (2 mg total) by mouth every 4 (four) hours as needed for severe pain. 11/25/20   Constant, Peggy, MD  ibuprofen  (ADVIL ) 800 MG tablet Take 1 tablet (800 mg total) by mouth every 8 (eight) hours. 11/25/20   Constant, Peggy, MD  metroNIDAZOLE  (FLAGYL ) 500 MG tablet Take 1 tablet (500 mg total) by mouth 2 (two) times daily. 11/25/20   Constant, Peggy, MD  nicotine  (NICODERM CQ  - DOSED IN MG/24 HOURS) 14 mg/24hr patch Place 1 patch (14 mg total) onto the skin daily. 11/26/20   Constant, Peggy, MD  NIFEdipine  (ADALAT  CC) 30 MG 24 hr  tablet Take 1 tablet (30 mg total) by mouth daily. 11/26/20   Constant, Peggy, MD  nortriptyline  (PAMELOR ) 50 MG capsule Take 50 mg by mouth at bedtime as needed (sleep). 11/19/20   [provider]  phentermine 37.5 MG capsule Take 37.5 mg by mouth daily.    [provider]  Prenatal Multivit-Min-Fe-FA (PRENATAL VITAMINS) 0.8 MG tablet Take 1 tablet by mouth daily. Patient not taking: Reported on 11/23/2020 02/04/16   Claudene, Virginia , CNM  Vitamin D, Ergocalciferol, (DRISDOL) 1.25 MG (50000 UNIT) CAPS  capsule Take 50,000 Units by mouth once a week. Wednesdays 11/19/20   [provider]    Allergies: Bee venom, Ultram [tramadol hcl], Duloxetine hcl, Magnesium-containing compounds, and Compazine [prochlorperazine edisylate]    Review of Systems  Respiratory:  Positive for shortness of breath and wheezing.   All other systems reviewed and are negative.   Updated Vital Signs BP (!) 162/78   Pulse (!) 102   Temp 99.5 F (37.5 C) (Oral)   Resp (!) 30   Ht 5' 6 (1.676 m)   Wt 102.5 kg   LMP 04/10/2024   SpO2 100%   BMI 36.47 kg/m   Physical Exam Vitals and nursing note reviewed.  Constitutional:      General: She is not in acute distress.    Appearance: She is well-developed.  HENT:     Head: Normocephalic and atraumatic.  Eyes:     Conjunctiva/sclera: Conjunctivae normal.  Cardiovascular:     Rate and Rhythm: Regular rhythm. Tachycardia present.     Heart sounds: No murmur heard. Pulmonary:     Effort: Pulmonary effort is normal. No respiratory distress.     Breath sounds: Wheezing present.  Abdominal:     Palpations: Abdomen is soft.     Tenderness: There is no abdominal tenderness.  Musculoskeletal:        General: No swelling.     Cervical back: Neck supple.  Skin:    General: Skin is warm and dry.     Capillary Refill: Capillary refill takes less than 2 seconds.  Neurological:     Mental Status: She is alert.  Psychiatric:        Mood and Affect: Mood normal.     (all labs ordered are listed, but only abnormal results are displayed) Labs Reviewed  BASIC METABOLIC PANEL WITH GFR - Abnormal; Notable for the following components:      Result Value   Potassium 3.4 (*)    Glucose, Bld 116 (*)    Calcium 8.7 (*)    All other components within normal limits  CBC - Abnormal; Notable for the following components:   WBC 13.8 (*)    RBC 5.27 (*)    All other components within normal limits  D-DIMER, QUANTITATIVE - Abnormal; Notable for the following  components:   D-Dimer, Quant 0.77 (*)    All other components within normal limits  RESP PANEL BY RT-PCR (RSV, FLU A&B, COVID)  [id number]  HCG, SERUM, QUALITATIVE  BRAIN NATRIURETIC PEPTIDE  TROPONIN I (HIGH SENSITIVITY)  TROPONIN I (HIGH SENSITIVITY)    EKG: EKG Interpretation Date/Time:  Sunday April 10 2024 23:54:42 EDT Ventricular Rate:  91 PR Interval:  146 QRS Duration:  78 QT Interval:  380 QTC Calculation: 467 R Axis:   62  Text Interpretation: Normal sinus rhythm Normal ECG When compared with ECG of 10-Aug-2017 06:29, No significant change was found Confirmed by Raford Lenis ([id number]) on 04/10/2024 11:59:35 PM  Radiology: CT  Angio Chest PE W and/or Wo Contrast Result Date: 04/11/2024 CLINICAL DATA:  38 year old female with shortness of breath after exposure to chemicals. Abnormal D-dimer. EXAM: CT ANGIOGRAPHY CHEST WITH CONTRAST TECHNIQUE: Multidetector CT imaging of the chest was performed using the standard protocol during bolus administration of intravenous contrast. Multiplanar CT image reconstructions and MIPs were obtained to evaluate the vascular anatomy. RADIATION DOSE REDUCTION: This exam was performed according to the departmental dose-optimization program which includes automated exposure control, adjustment of the mA and/or kV according to patient size and/or use of iterative reconstruction technique. CONTRAST:  75mL OMNIPAQUE  IOHEXOL  350 MG/ML SOLN COMPARISON:  Chest radiographs last night.  CTA chest 07/23/2006. FINDINGS: Cardiovascular: Adequate contrast bolus timing in the pulmonary arterial tree. No pulmonary artery filling defect identified. Normal heart size. No pericardial effusion. Negative visible aorta. Mediastinum/Nodes: Mediastinal lymphadenopathy, individual anterior carina lymph nodes up to 14 mm short axis. Bilateral hilar lymph nodes up to 13 mm short axis. Thoracic inlet appears spared. Pre-vascular nodal space less affected. No axillary lymphadenopathy.  Lungs/Pleura: Widespread bilateral peribronchial ground-glass Patchy and confluent opacity. All lobes affected. Major airways are patent. No consolidation. No pleural effusion. Upper Abdomen: Negative visible mostly noncontrast liver, pancreas, adrenal glands, kidneys, and bowel in the upper abdomen. Spleen size is prominent but stable since 2008, upper limits of normal. Musculoskeletal: Thoracic spine endplate degeneration. No acute or suspicious osseous lesion. Review of the MIP images confirms the above findings. IMPRESSION: 1. Negative for acute pulmonary embolus. 2. Widespread bilateral patchy and irregular lung opacity scattered in all lobes. With bulky and reactive appearing mediastinal and hilar lymph nodes. Inhalational Injury could have similar lung findings, but given also lymphadenopathy consider Acute Bilateral Bronchopneumonia as well. No pleural effusion. Electronically Signed   By: VEAR Hurst M.D.   On: 04/11/2024 04:39   DG Chest 2 View Result Date: 04/10/2024 CLINICAL DATA:  Inhaled chemicals, labored breathing, chest pain EXAM: CHEST - 2 VIEW COMPARISON:  08/10/2017 FINDINGS: The heart size and mediastinal contours are within normal limits. Both lungs are clear. The visualized skeletal structures are unremarkable. No pneumothorax. IMPRESSION: No active cardiopulmonary disease. Electronically Signed   By: Franky Crease M.D.   On: 04/10/2024 23:56    Procedures   Medications Ordered in the ED  ipratropium-albuterol  (DUONEB) 0.5-2.5 (3) MG/3ML nebulizer solution 3 mL (3 mLs Nebulization Given 04/11/24 0053)  ipratropium-albuterol  (DUONEB) 0.5-2.5 (3) MG/3ML nebulizer solution 3 mL (3 mLs Nebulization Given 04/11/24 0248)  methylPREDNISolone  sodium succinate (SOLU-MEDROL ) 125 mg/2 mL injection 125 mg (125 mg Intravenous Given 04/11/24 0326)  metoprolol tartrate (LOPRESSOR) tablet 25 mg (25 mg Oral Given 04/11/24 0316)  potassium chloride SA (KLOR-CON M) CR tablet 40 mEq (40 mEq Oral Given 04/11/24  0316)  oxyCODONE -acetaminophen  (PERCOCET/ROXICET) 5-325 MG per tablet 1 tablet (1 tablet Oral Given 04/11/24 0410)  iohexol  (OMNIPAQUE ) 350 MG/ML injection 75 mL (75 mLs Intravenous Contrast Given 04/11/24 0405)  doxycycline  (VIBRA -TABS) tablet 100 mg (100 mg Oral Given 04/11/24 0457)    Clinical Course as of 04/11/24 0458  Mon Apr 11, 2024  0153 DG Chest 2 View [CG]  [phone] Reassessed after DuoNeb, Solu-Medrol  patient reports breathing much better.  No wheezing.  D-dimer is elevated so we will CTA. [CG]  0443 DG Chest 2 View [CG]    Clinical Course User Index [CG] Ruthell Lonni FALCON, PA-C   Medical Decision Making Amount and/or Complexity of Data Reviewed Labs: ordered. Radiology: ordered. Decision-making details documented in ED Course.  Risk Prescription drug management.  This is a 38 year old female presenting to the ED out of concern of shortness of breath.  On exam, she is tachycardic to 104.  Afebrile.  Lung sounds have wheezing throughout, oxygen saturation 100% room air.  Abdomen soft and compressible.  Neurological examination at baseline.  No edema to bilateral lower extremities.  Patient received DuoNeb in triage.  Patient continues to wheeze.  Patient was then given DuoNeb, Solu-Medrol  125.  Patient also arrives hypertensive, tachycardic, reports he takes metoprolol 25 mg twice a day, has not taken her nighttime dose.  This was ordered.  Patient workup reveals a CBC with leukocytosis to 13.8 and a stable hemoglobin of 15.  Metabolic panel with potassium 3.4 repleted with 40 mEq oral potassium, no other electrolyte derangement, anion gap 12.  Troponin are flat at 6, 7.  Viral panel negative for all.  BNP not elevated at 49.5.  Chest x-ray unremarkable.  D-dimer was collected and D-dimer is elevated.  CTA was collected due to elevated D-dimer, negative for acute PE.  Does show widespread bilateral patchy and irregular lung opacities scattered in all lobes.  With bulky and  reactive appearing mediastinal and hilar lymph nodes.  Inhalational injury could have similar lung findings but given also lymphadenopathy consider acute bilateral bronchopneumonia.  No pleural effusion.  Patient reevaluated after DuoNebs and labs have resulted.  Patient wheezing subsided and she states she feels much less short of breath.  She was ambulated and her oxygen saturation dropped to 93% and she became tachycardic.  She denies lightheadedness, dizziness or weakness when she ambulates.  She does want to go home and she is requesting discharge.  I had a long discussion with the patient.  I advised the patient that I was offering admission however she deferred stating that she wished to go home.  I gave her very strict return precautions and advised her that if she goes home and deteriorates, becomes lightheaded, dizzy or weak when she stands she will need to come back to the ED and she voiced understanding.  She will be sent home with prednisone , albuterol  inhaler refill, doxycycline  as well as Z-Pak.  She was advised to follow-up outpatient for reevaluation and she voiced understanding.  She was given very strict return precautions and she voiced understanding.  She was once again offered admission but she defers stating she wishes to go home.  Patient discharged at this time with antibiotics, prednisone .    Final diagnoses:  Shortness of breath  Bronchopneumonia    ED Discharge Orders          Ordered    doxycycline  (VIBRAMYCIN ) 100 MG capsule  2 times daily        04/11/24 0457    azithromycin (ZITHROMAX) 250 MG tablet  Daily        04/11/24 0457    albuterol  (VENTOLIN  HFA) 108 (90 Base) MCG/ACT inhaler  Every 6 hours PRN        04/11/24 0457    predniSONE  (DELTASONE ) 20 MG tablet  Daily        04/11/24 0457               Ruthell Lonni FALCON, PA-C 04/11/24 0459    Raford Lenis, MD 04/11/24 [phone]

## 2024-04-23 ENCOUNTER — Other Ambulatory Visit: Payer: Self-pay

## 2024-04-23 ENCOUNTER — Emergency Department (HOSPITAL_COMMUNITY)
Admission: EM | Admit: 2024-04-23 | Discharge: 2024-04-24 | Disposition: A | Attending: Emergency Medicine | Admitting: Emergency Medicine

## 2024-04-23 DIAGNOSIS — I10 Essential (primary) hypertension: Secondary | ICD-10-CM | POA: Insufficient documentation

## 2024-04-23 DIAGNOSIS — D72829 Elevated white blood cell count, unspecified: Secondary | ICD-10-CM | POA: Diagnosis not present

## 2024-04-23 DIAGNOSIS — Z79899 Other long term (current) drug therapy: Secondary | ICD-10-CM | POA: Insufficient documentation

## 2024-04-23 DIAGNOSIS — M5412 Radiculopathy, cervical region: Secondary | ICD-10-CM | POA: Insufficient documentation

## 2024-04-23 DIAGNOSIS — R918 Other nonspecific abnormal finding of lung field: Secondary | ICD-10-CM | POA: Insufficient documentation

## 2024-04-23 DIAGNOSIS — M542 Cervicalgia: Secondary | ICD-10-CM | POA: Diagnosis present

## 2024-04-23 DIAGNOSIS — Z7951 Long term (current) use of inhaled steroids: Secondary | ICD-10-CM | POA: Insufficient documentation

## 2024-04-23 DIAGNOSIS — J449 Chronic obstructive pulmonary disease, unspecified: Secondary | ICD-10-CM | POA: Insufficient documentation

## 2024-04-23 MED ORDER — OXYCODONE-ACETAMINOPHEN 5-325 MG PO TABS
1.0000 | ORAL_TABLET | Freq: Once | ORAL | Status: AC
  Administered 2024-04-23: 1 via ORAL
  Filled 2024-04-23: qty 1

## 2024-04-23 NOTE — ED Triage Notes (Signed)
 Pt reports L shoulder pain, neck pain, and L arm/hand tingling x2 weeks. - stroke scale

## 2024-04-24 ENCOUNTER — Emergency Department (HOSPITAL_COMMUNITY)

## 2024-04-24 LAB — BASIC METABOLIC PANEL WITH GFR
Anion gap: 10 (ref 5–15)
BUN: 11 mg/dL (ref 6–20)
CO2: 26 mmol/L (ref 22–32)
Calcium: 9 mg/dL (ref 8.9–10.3)
Chloride: 99 mmol/L (ref 98–111)
Creatinine, Ser: 0.73 mg/dL (ref 0.44–1.00)
GFR, Estimated: >60 mL/min (ref >60)
Glucose, Bld: 144 mg/dL — ABNORMAL HIGH (ref 70–99)
Potassium: 3.8 mmol/L (ref 3.5–5.1)
Sodium: 135 mmol/L (ref 135–145)

## 2024-04-24 LAB — CBC
HCT: 46.3 % — ABNORMAL HIGH (ref 36.0–46.0)
Hemoglobin: 15.5 g/dL — ABNORMAL HIGH (ref 12.0–15.0)
MCH: 28.2 pg (ref 26.0–34.0)
MCHC: 33.5 g/dL (ref 30.0–36.0)
MCV: 84.3 fL (ref 80.0–100.0)
Platelets: 227 K/uL (ref 150–400)
RBC: 5.49 MIL/uL — ABNORMAL HIGH (ref 3.87–5.11)
RDW: 12.6 % (ref 11.5–15.5)
WBC: 14.9 K/uL — ABNORMAL HIGH (ref 4.0–10.5)
nRBC: 0 % (ref 0.0–0.2)

## 2024-04-24 MED ORDER — ONDANSETRON 4 MG PO TBDP
4.0000 mg | ORAL_TABLET | Freq: Once | ORAL | Status: AC
Start: 2024-04-24 — End: 2024-04-24
  Administered 2024-04-24: 4 mg via ORAL
  Filled 2024-04-24: qty 1

## 2024-04-24 MED ORDER — METHOCARBAMOL 500 MG PO TABS: 500.0000 mg | ORAL_TABLET | Freq: Two times a day (BID) | ORAL | 0 refills | Status: AC | PRN

## 2024-04-24 MED ORDER — DEXAMETHASONE SOD PHOSPHATE PF 10 MG/ML IJ SOLN
10.0000 mg | Freq: Once | INTRAMUSCULAR | Status: AC
  Administered 2024-04-24: 10 mg via INTRAVENOUS

## 2024-04-24 MED ORDER — PREDNISONE 20 MG PO TABS: 40.0000 mg | ORAL_TABLET | Freq: Every day | ORAL | 0 refills | Status: AC

## 2024-04-24 MED ORDER — KETOROLAC TROMETHAMINE 15 MG/ML IJ SOLN
15.0000 mg | Freq: Once | INTRAMUSCULAR | Status: AC
  Administered 2024-04-24: 15 mg via INTRAVENOUS
  Filled 2024-04-24: qty 1

## 2024-04-24 MED ORDER — HYDROMORPHONE HCL 1 MG/ML IJ SOLN
1.0000 mg | Freq: Once | INTRAMUSCULAR | Status: AC
  Administered 2024-04-24: 1 mg via INTRAVENOUS
  Filled 2024-04-24: qty 1

## 2024-04-24 NOTE — ED Provider Notes (Signed)
 [facility] Provider Note   CSN: [id number] Arrival date & time: 04/23/24  2204     Patient presents with: Neck Pain and Shoulder Pain   [name] is a 38 y.o. female.   Patient is a challenging historian. She is tearful and her speech is tangential. Answers often circumvent the question despite multiple redirections during the encounter.  38 y/o female with hx of chronic pain (on Oxy 10mg ), HTN, COPD, migraines, and morbid obesity presents for pain in the LUE. Primarily in the L shoulder which radiates to her arm and hand. Does not report specific worsening with movement or positioning. No associated trauma. Endorses some paresthesias in the LUE, Like if you were standing on your tip toes and holding up a box over your head for a while. No improvement with chronic pain Rx.  Also perseverating on recent ED visit for which she was evaluated for SOB. Discharged with course of abx after dx of bronchopneumonia on CTA which was also negative for PE. Has completed abx as prescribed and using her inhaler sporadically for any residual SOB. LUE pain not worse with breathing. No fevers.  The history is provided by the patient. No language interpreter was used.  Neck Pain Shoulder Pain Associated symptoms: neck pain        Prior to Admission medications   Medication Sig Start Date End Date Taking? Authorizing Provider  methocarbamol (ROBAXIN) 500 MG tablet Take 1 tablet (500 mg total) by mouth every 12 (twelve) hours as needed for muscle spasms. 04/24/24  Yes Keith Sor, PA-C  predniSONE  (DELTASONE ) 20 MG tablet Take 2 tablets (40 mg total) by mouth daily with breakfast. 04/24/24  Yes Keith Sor, PA-C  albuterol  (VENTOLIN  HFA) 108 (90 Base) MCG/ACT inhaler Inhale 1-2 puffs into the lungs every 6 (six) hours as needed for wheezing or shortness of breath. 04/11/24   Ruthell Lonni FALCON, PA-C  beclomethasone (QVAR ) 80 MCG/ACT  inhaler Inhale 2 puffs into the lungs 2 (two) times daily. 04/18/16   Noreen Tonnie BRAVO, MD  clindamycin  (CLEOCIN ) 300 MG capsule Take 1 capsule (300 mg total) by mouth 4 (four) times daily. X 7 days 11/12/23   Geroldine Berg, MD  doxycycline  (VIBRAMYCIN ) 100 MG capsule Take 1 capsule (100 mg total) by mouth 2 (two) times daily. 04/11/24   Ruthell Lonni FALCON, PA-C  hydrochlorothiazide (HYDRODIURIL) 25 MG tablet Take 25 mg by mouth daily. 11/19/20   [provider]  ibuprofen  (ADVIL ) 800 MG tablet Take 1 tablet (800 mg total) by mouth every 8 (eight) hours. 11/25/20   Constant, Peggy, MD  metroNIDAZOLE  (FLAGYL ) 500 MG tablet Take 1 tablet (500 mg total) by mouth 2 (two) times daily. 11/25/20   Constant, Peggy, MD  nicotine  (NICODERM CQ  - DOSED IN MG/24 HOURS) 14 mg/24hr patch Place 1 patch (14 mg total) onto the skin daily. 11/26/20   Constant, Peggy, MD  NIFEdipine  (ADALAT  CC) 30 MG 24 hr tablet Take 1 tablet (30 mg total) by mouth daily. 11/26/20   Constant, Peggy, MD  nortriptyline  (PAMELOR ) 50 MG capsule Take 50 mg by mouth at bedtime as needed (sleep). 11/19/20   [provider]  phentermine 37.5 MG capsule Take 37.5 mg by mouth daily.    [provider]  Prenatal Multivit-Min-Fe-FA (PRENATAL VITAMINS) 0.8 MG tablet Take 1 tablet by mouth daily. Patient not taking: Reported on 11/23/2020 02/04/16   Claudene, Virginia , CNM  Vitamin D, Ergocalciferol, (DRISDOL) 1.25 MG (50000  UNIT) CAPS capsule Take 50,000 Units by mouth once a week. Wednesdays 11/19/20   [provider]    Allergies: Bee venom, Ultram [tramadol hcl], Duloxetine hcl, Magnesium-containing compounds, and Compazine [prochlorperazine edisylate]    Review of Systems  Musculoskeletal:  Positive for neck pain.  Ten systems reviewed and are negative for acute change, except as noted in the HPI.    Updated Vital Signs BP (!) 168/72   Pulse 84   Temp 97.9 F (36.6 C)   Resp 18   LMP 04/10/2024   SpO2 99%    Physical Exam Vitals and nursing note reviewed.  Constitutional:      General: She is not in acute distress.    Appearance: She is well-developed. She is not diaphoretic.     Comments: Tearful   HENT:     Head: Normocephalic and atraumatic.  Eyes:     General: No scleral icterus.    Conjunctiva/sclera: Conjunctivae normal.  Cardiovascular:     Rate and Rhythm: Normal rate and regular rhythm.     Pulses: Normal pulses.     Comments: Distal radial pulse 2+ in the LUE Pulmonary:     Effort: Pulmonary effort is normal. No respiratory distress.     Comments: Respirations even and unlabored Musculoskeletal:        General: No deformity. Normal range of motion.     Cervical back: Normal range of motion.       Back:     Comments: No crepitus or deformity of the LUE or shoulder. Preserved PROM in the LUE. Extremity is warm, well perfused. Compartments soft, compressible.  Skin:    General: Skin is warm and dry.     Coloration: Skin is not pale.     Findings: No erythema or rash.  Neurological:     Mental Status: She is alert and oriented to person, place, and time.     Coordination: Coordination normal.     Comments: GCS 15. Speech is goal oriented. Patient has equal grip strength bilaterally with 5/5 strength against resistance in all major muscle groups bilaterally. Sensation to light touch intact. Patient moves extremities without ataxia. Patient ambulatory with steady gait.  Psychiatric:        Behavior: Behavior normal.     (all labs ordered are listed, but only abnormal results are displayed) Labs Reviewed  CBC - Abnormal; Notable for the following components:      Result Value   WBC 14.9 (*)    RBC 5.49 (*)    Hemoglobin 15.5 (*)    HCT 46.3 (*)    All other components within normal limits  BASIC METABOLIC PANEL WITH GFR - Abnormal; Notable for the following components:   Glucose, Bld 144 (*)    All other components within normal limits     EKG: None  Radiology: CT Cervical Spine Wo Contrast Result Date: 04/24/2024 EXAM: CT CERVICAL SPINE WITHOUT CONTRAST 04/24/2024 04:35:15 AM TECHNIQUE: CT of the cervical spine was performed without the administration of intravenous contrast. Multiplanar reformatted images are provided for review. Automated exposure control, iterative reconstruction, and/or weight based adjustment of the mA/kV was utilized to reduce the radiation dose to as low as reasonably achievable. COMPARISON: Cervical spine CT 06/14/2015. CLINICAL HISTORY: 38 year old female. Cervical radiculopathy, neck pain. FINDINGS: CERVICAL SPINE: BONES AND ALIGNMENT: No acute fracture or traumatic malalignment. Straightening of cervical lordosis previously, mild reversal now. DEGENERATIVE CHANGES: Chronic cervical disc and endplate degeneration at C5-C6 and C6-C7 does not  appear significantly changed. However, moderate to severe bilateral neural foraminal stenosis suspected at the latter. And Mild degenerative spinal stenosis is also possible at C6-C7. SOFT TISSUES: No prevertebral soft tissue swelling. INCIDENTAL FINDINGS: Negative cervicomedullary junction and visible noncontrast brain parenchyma. Middle ears and mastoids appear clear. Negative visible noncontrast thoracic inlet. IMPRESSION: 1. No acute finding on cervical spine CT. 2. Chronic disc and endplate degeneration at C5-C6 and C6-C7. Evidence of moderate to severe bilateral foraminal stenosis and possible mild spinal stenosis at the latter, Query C7 radiculitis. Electronically signed by: Helayne Hurst MD 04/24/2024 04:53 AM EDT RP Workstation: [id number]   DG Chest 2 View Result Date: 04/24/2024 EXAM: 2 VIEW(S) XRAY OF THE CHEST 04/24/2024 04:00:00 AM COMPARISON: CTA chest 04/11/2024 and earlier. CLINICAL HISTORY: 38 year old female. SOB. Encounter for shortness of breath. FINDINGS: LUNGS AND PLEURA: Lung volumes remain normal. Streaky and coarse bilateral perihilar and  interstitial lung opacity persists since last month, does not appear significantly improved. No consolidation. No pleural effusion. No pneumothorax. No pulmonary edema. HEART AND MEDIASTINUM: Mediastinal contours remain normal. No acute abnormality of the cardiac silhouette. BONES AND SOFT TISSUES: Chronic disc and endplate degeneration in the thoracic spine. Cholecystectomy clips again noted. No acute osseous abnormality. IMPRESSION: 1. Unresolved coarse bilateral perihilar and interstitial lung opacity since the CTA last month, which also demonstrated generalized reactive-appearing mediastinal lymph nodes. Broad differential considerations including both acute and chronic, infectious and noninfectious processes. Pulmonary medicine consultation may be valuable. 2. No focal consolidation, pleural effusion, or new cardiopulmonary abnormality. Electronically signed by: Helayne Hurst MD 04/24/2024 04:10 AM EDT RP Workstation: [id number]     Procedures   Medications Ordered in the ED  oxyCODONE -acetaminophen  (PERCOCET/ROXICET) 5-325 MG per tablet 1 tablet (1 tablet Oral Given 04/23/24 2212)  HYDROmorphone  (DILAUDID ) injection 1 mg (1 mg Intravenous Given 04/24/24 0344)  ketorolac  (TORADOL ) 15 MG/ML injection 15 mg (15 mg Intravenous Given 04/24/24 0346)  dexamethasone  (DECADRON ) injection 10 mg (10 mg Intravenous Given 04/24/24 0347)  ondansetron  (ZOFRAN -ODT) disintegrating tablet 4 mg (4 mg Oral Given 04/24/24 0630)    Clinical Course as of 04/24/24 2152  Sun Apr 24, 2024  0600 Pain improving with medications.  [KH]    Clinical Course User Index [KH] Keith Sor, PA-C                                 Medical Decision Making Amount and/or Complexity of Data Reviewed Labs: ordered. Radiology: ordered.  Risk Prescription drug management.   This patient presents to the ED for concern of LUE pain, this involves an extensive number of treatment options, and is a complaint that carries with it a  high risk of complications and morbidity.  The differential diagnosis includes fracture vs tendonitis vs radiculopathy vs osteomyelitis vs discitis vs abscess   Co morbidities that complicate the patient evaluation  Chronic pain Hypertension COPD Migraine headaches   Additional history obtained:  External records from outside source obtained and reviewed including CTA chest from 04/11/24 which showed widespread bilateral patchy and irregular lung opacities scattered in all lobes.   Lab Tests:  I Ordered, and personally interpreted labs.  The pertinent results include:  WBC 14.9 (chronic elevation for 3+ years; stable), Hgb 15.5   Imaging Studies ordered:  I ordered imaging studies including CT C-spine and CXR  I independently visualized and interpreted imaging which showed no acute pathology on C-spine imaging. CXR compared to 2  weeks ago appears stable; persistent coarse bilateral perihilar and interstitial lung opacities. I agree with the radiologist interpretation   Cardiac Monitoring:  The patient was maintained on a cardiac monitor.  I personally viewed and interpreted the cardiac monitored which showed an underlying rhythm of: NSR   Medicines ordered and prescription drug management:  I ordered medication including Dilaudid , Toradol , and Decadron  for pain  Reevaluation of the patient after these medicines showed that the patient improved I have reviewed the patients home medicines and have made adjustments as needed   Test Considered:  MRI C-spine - felt nonemergent. Neurovascularly intact on exam. No signs of central cord compression.   Problem List / ED Course:  Patient presenting for left upper extremity pain and paresthesias.  She does have some reproducible tenderness to the medial aspect of the left trapezius.  Suspect there is a degree of peripheral radiculopathy contributing to her pain.  She does have preserved active and passive range of motion of the  left upper extremity; doubt septic shoulder joint.  Leukocytosis noted, but this is chronic for the patient and largely stable.  She has no other infectious signs such as fever, tachycardia, tachypnea. CT shows that there is a degree of chronic disc and endplate degeneration to the cervical spine; however, this is largely unchanged from 2016.  No evidence of central cord compression. The patient has had symptomatic improvement with medications in the emergency department.  Will continue on steroid burst with addition of muscle relaxants.  She is followed by pain management and receives 10 mg oxycodone  tablets monthly. Chest x-ray obtained for comparison to CT from 2 weeks ago.  She does not report any worsening shortness of breath, dyspnea on exertion, though bilateral perihilar and interstitial lung opacities appear unresolved despite course of antibiotics.  Moving air well at bedside.  No hypoxemia.  Will refer to pulmonology for outpatient follow-up.  Do not feel further emergent workup is presently indicated for this condition.   Reevaluation:  After the interventions noted above, I reevaluated the patient and found that they have :improved   Social Determinants of Health:  Bilingual    Dispostion:  After consideration of the diagnostic results and the patients response to treatment, I feel that the patent would benefit from ongoing outpatient follow up. Return precautions discussed and provided. Patient discharged in stable condition with no unaddressed concerns.       Final diagnoses:  Cervical radiculopathy  Opacity of lung on imaging study    ED Discharge Orders          Ordered    predniSONE  (DELTASONE ) 20 MG tablet  Daily with breakfast        04/24/24 0557    methocarbamol (ROBAXIN) 500 MG tablet  Every 12 hours PRN        04/24/24 0557    Ambulatory referral to Pulmonology        04/24/24 0600               Keith Sor, DEVONNA 04/24/24 2211    Darra Fonda MATSU, MD 04/25/24 0330

## 2024-04-24 NOTE — Discharge Instructions (Addendum)
 Take plan concerning Robaxin as prescribed for management of your left arm pain and tingling.  You would benefit from follow-up with neurosurgery if complaints remain ongoing.  You had an abnormal chest CT at the end of September.  The changes within your lungs have had little change today compared to this prior workup.  We advise follow-up with pulmonology for this reason.  Continue your daily prescribed medications.  Return for new or concerning symptoms.

## 2024-04-28 ENCOUNTER — Ambulatory Visit (INDEPENDENT_AMBULATORY_CARE_PROVIDER_SITE_OTHER)

## 2024-04-28 VITALS — BP 157/97 | HR 104 | Temp 98.7°F | Ht 65.0 in | Wt 236.6 lb

## 2024-04-28 DIAGNOSIS — J189 Pneumonia, unspecified organism: Secondary | ICD-10-CM | POA: Diagnosis not present

## 2024-04-28 DIAGNOSIS — R591 Generalized enlarged lymph nodes: Secondary | ICD-10-CM

## 2024-04-28 DIAGNOSIS — R0602 Shortness of breath: Secondary | ICD-10-CM

## 2024-04-28 DIAGNOSIS — F17210 Nicotine dependence, cigarettes, uncomplicated: Secondary | ICD-10-CM

## 2024-04-28 DIAGNOSIS — R9389 Abnormal findings on diagnostic imaging of other specified body structures: Secondary | ICD-10-CM

## 2024-04-28 NOTE — Patient Instructions (Signed)
  VISIT SUMMARY: During your visit, we discussed your recent respiratory issues following chemical exposure, your ongoing tobacco use, and your suspected chronic obstructive pulmonary disease (COPD). We reviewed your symptoms, current medications, and the treatments you received in the ER. We also discussed the importance of smoking cessation and planned further evaluations to monitor your lung health.  YOUR PLAN: -ACUTE PNEUMONIA WITH LUNG INFILTRATES AND LYMPHADENOPATHY: Acute pneumonia is an infection that inflames the air sacs in one or both lungs, which can fill with fluid. Your symptoms have improved but are not fully resolved. We will order a repeat chest CT scan in 6-8 weeks to check the resolution of inflammation. Continue using your albuterol  inhaler as needed. If your symptoms persist or worsen after the CT scan, we may consider a bronchoscopy. We will also send your notes to your primary care provider.  -TOBACCO USE DISORDER: Tobacco use disorder is a dependence on tobacco products. You have reduced your smoking from three packs a day to half a pack, which is a positive step. However, it is critical to stop smoking completely due to your lung inflammation. We discussed the risks of continued smoking and the benefits of quitting. Although you are not ready to quit completely, we talked about options like Wellbutrin and nicotine  patches for the future. For now, try to stop smoking entirely for at least the next two months.  -SUSPECTED CHRONIC OBSTRUCTIVE PULMONARY DISEASE (COPD): COPD is a chronic inflammatory lung disease that obstructs airflow from the lungs. We suspect you may have COPD, but we will wait to perform pulmonary function tests (PFTs) until after your current lung inflammation has resolved. We plan to order these tests in about three months.  INSTRUCTIONS: Please schedule a repeat chest CT scan for the end of November. Continue using your albuterol  inhaler as needed. If your  symptoms persist or worsen, we may need to perform a bronchoscopy. Try to stop smoking completely for at least the next two months. We will plan to order pulmonary function tests in approximately three months to evaluate for COPD.                      Contains text generated by [employer].                                 Contains text generated by [employer].

## 2024-04-28 NOTE — Progress Notes (Signed)
 Subjective:   PATIENT ID: [name] GENDER: female DOB: 07/02/1986, MRN: [id number]   HPI Discussed the use of AI scribe software for clinical note transcription with the patient, who gave verbal consent to proceed.  History of Present Illness [name] is a 38 year old female with presumed COPD, chronic pain on oxycodone , morbid obesity, migraines, hypertension who presents with respiratory issues following chemical exposure. She was referred by the ER for evaluation of respiratory issues.  On September 25th, she was exposed to fumes while cleaning a basement with germicide bleach. Despite wearing two masks and gloves, she experienced difficulty breathing approximately 35-45 minutes into the task. She attempted to alleviate her symptoms with an albuterol  inhaler and a handheld fan, but it took about an hour to catch her breath. She continued to experience shortness of breath and wheezing over the next four days.  On September 29th, she presented to the ER with wheezing, low oxygen levels, and shortness of breath. She was diagnosed with pneumonia and treated with breathing treatments, antibiotics (doxycycline  and azithromycin), an albuterol  inhaler, and prednisone . Despite treatment, she continued to experience pain radiating from her neck to her shoulder, described as a 'hard tingling' sensation.  She presented again to the ER on 04/24/2024 with shoulder pain and neck pain, at that time a chest x-ray was performed which showed persistent infiltrates on her x-ray and she was given more prednisone  and referred to pulmonary for further evaluation.  She has a history of smoking, currently reduced to about half a pack a day from three packs. She has a history of heavy smoking and is trying to quit smoking. She has experienced chills and sweats, which she attributes to pain, and a poor appetite since the incident. She has four dogs and five children at home,  which adds to her stress and anxiety.  She is currently taking prednisone , two pills in the morning, for a total of ten days, and a muscle relaxer for pinched nerves. She completed a previous course of prednisone  and a Z-Pak before her recent ER visit.     Past Medical History:  Diagnosis Date   Allergic rhinitis    Anemia    Asthma    exercise induced   Chronic back pain    COPD (chronic obstructive pulmonary disease) (HCC)    GERD (gastroesophageal reflux disease)    Headache    Headache(784.0)    Hypertension    Kienbock's disease    Migraines    Morbid obesity (HCC)    Trichomonas      Family History  Problem Relation Age of Onset   Diabetes Mother    COPD Mother    Anxiety disorder Mother    Hashimoto's thyroiditis Mother    Hypertension Brother    COPD Brother    Depression Brother    Colon cancer Brother    Hashimoto's thyroiditis Maternal Aunt    Anesthesia problems Neg Hx      Social History   Socioeconomic History   Marital status: Legally Separated    Spouse name: Not on file   Number of children: 4   Years of education: GED   Highest education level: Not on file  Occupational History   Occupation: Unemployed  Tobacco Use   Smoking status: Every Day    Current packs/day: 1.00    Average packs/day: 1 pack/day for 30.9 years (30.9 ttl pk-yrs)    Types: Cigarettes    Start date:  05/17/1993   Smokeless tobacco: Never   Tobacco comments:    Peak rate of 3ppd  Substance and Sexual Activity   Alcohol use: No   Drug use: No   Sexual activity: Yes    Birth control/protection: None    Comment: tubal in 2008  Other Topics Concern   Not on file  Social History Narrative   Lives at home with husband and 3 of her 4 children (oldest daughter lives with her mother).   Right-handed.   12 cans of Mountain Dew daily.      [facility]:   Originally from [place], [place]. She has also lived in [place], [place], [place], [place], [place] , [place], & [place]. 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 Has 2 dogs currently. Does have indoor plants. Previous home was condemned due to disrepair. No bird exposure.    Social Drivers of Corporate investment banker Strain: Not on file  Food Insecurity: Not on file  Transportation Needs: Not on file  Physical Activity: Not on file  Stress: Not on file  Social Connections: Not on file  Intimate Partner Violence: Not on file     Allergies  Allergen Reactions   Bee Venom Anaphylaxis   Ultram [Tramadol Hcl] Anaphylaxis, Hives and Rash   Duloxetine Hcl Anxiety and Rash   Magnesium-Containing Compounds Hives and Rash   Compazine [Prochlorperazine Edisylate] Other (See Comments)    Made patient very depressed     Outpatient Medications Prior to Visit  Medication Sig Dispense Refill   albuterol  (VENTOLIN  HFA) 108 (90 Base) MCG/ACT inhaler Inhale 1-2 puffs into the lungs every 6 (six) hours as needed for wheezing or shortness of breath. 18 g 0   clindamycin  (CLEOCIN ) 300 MG capsule Take 1 capsule (300 mg total) by mouth 4 (four) times daily. X 7 days 28 capsule 0   doxycycline  (VIBRAMYCIN ) 100 MG capsule Take 1 capsule (100 mg total) by mouth 2 (two) times daily. 14 capsule 0   hydrochlorothiazide (HYDRODIURIL) 25 MG tablet Take 25 mg by mouth daily.     ibuprofen  (ADVIL ) 800 MG tablet Take 1 tablet (800 mg total) by mouth every 8 (eight) hours. 30 tablet 0   methocarbamol (ROBAXIN) 500 MG tablet Take 1 tablet (500 mg total) by mouth every 12 (twelve) hours as needed for muscle spasms. 20 tablet 0   nortriptyline  (PAMELOR ) 50 MG capsule Take 50 mg by mouth at bedtime as needed (sleep).     phentermine 37.5 MG capsule Take 37.5 mg by mouth daily.     predniSONE  (DELTASONE ) 20 MG tablet Take 2 tablets (40 mg total) by mouth daily with breakfast. 10 tablet 0   beclomethasone (QVAR ) 80 MCG/ACT inhaler Inhale 2 puffs into the lungs 2 (two) times daily. 1 Inhaler 3   metroNIDAZOLE  (FLAGYL ) 500 MG tablet  Take 1 tablet (500 mg total) by mouth 2 (two) times daily. 14 tablet 0   nicotine  (NICODERM CQ  - DOSED IN MG/24 HOURS) 14 mg/24hr patch Place 1 patch (14 mg total) onto the skin daily. 28 patch 0   NIFEdipine  (ADALAT  CC) 30 MG 24 hr tablet Take 1 tablet (30 mg total) by mouth daily. 30 tablet 1   Prenatal Multivit-Min-Fe-FA (PRENATAL VITAMINS) 0.8 MG tablet Take 1 tablet by mouth daily. (Patient not taking: Reported on 11/23/2020) 30 tablet 12   Vitamin D, Ergocalciferol, (DRISDOL) 1.25 MG (50000 UNIT) CAPS capsule Take 50,000 Units by mouth once a week. Wednesdays  No facility-administered medications prior to visit.    ROS Reviewed all systems and reported negative except as above     Objective:   Vitals:   04/28/24 1607  BP: (!) 157/97  Pulse: (!) 104  Temp: 98.7 F (37.1 C)  TempSrc: Oral  SpO2: 93%  Weight: 236 lb 9.6 oz (107.3 kg)  Height: 5' 5 (1.651 m)    Physical Exam Constitutional:      Appearance: Normal appearance.  HENT:     Head: Normocephalic and atraumatic.     Nose: Nose normal.     Mouth/Throat:     Mouth: Mucous membranes are moist.  Eyes:     Extraocular Movements: Extraocular movements intact.     Pupils: Pupils are equal, round, and reactive to light.  Cardiovascular:     Rate and Rhythm: Normal rate and regular rhythm.  Pulmonary:     Effort: Pulmonary effort is normal.  Abdominal:     General: Abdomen is flat.  Musculoskeletal:        General: Tenderness: .smokingcessationchmg. Normal range of motion.     Cervical back: Normal range of motion.  Skin:    General: Skin is warm.     Capillary Refill: Capillary refill takes less than 2 seconds.  Neurological:     General: No focal deficit present.     Mental Status: She is alert.    Physical Exam CHEST: Lungs clear to auscultation bilaterally. CARDIOVASCULAR: Heart sounds normal.     CBC    Component Value Date/Time   WBC 14.9 (H) 04/24/2024 0347   RBC 5.49 (H) 04/24/2024 0347    HGB 15.5 (H) 04/24/2024 0347   HCT 46.3 (H) 04/24/2024 0347   PLT 227 04/24/2024 0347   MCV 84.3 04/24/2024 0347   MCH 28.2 04/24/2024 0347   MCHC 33.5 04/24/2024 0347   RDW 12.6 04/24/2024 0347   LYMPHSABS 4.0 08/01/2023 2212   MONOABS 0.7 08/01/2023 2212   EOSABS 0.6 (H) 08/01/2023 2212   BASOSABS 0.1 08/01/2023 2212     Chest imaging: I reviewed her CT chest performed in September which shows multifocal infiltrates concerning for pneumonia.  PFT:    Latest Ref Rng & Units 05/27/2016    2:13 PM  PFT Results  FVC-Pre L 3.70   FVC-Predicted Pre % 92   FVC-Post L 3.76   FVC-Predicted Post % 93   Pre FEV1/FVC % % 76   Post FEV1/FCV % % 78   FEV1-Pre L 2.81   FEV1-Predicted Pre % 83   FEV1-Post L 2.92     Labs:    Echo:       Assessment & Plan:   Assessment and Plan Assessment & Plan Acute pneumonia with lung infiltrates and lymphadenopathy, likely related to chemical pneumonitis from mold/bleach exposure Symptoms improved but not resolved. CT scan showed infiltrates, possibly due to chemical exposure or infection. Chest x-ray showed no significant change. Further imaging needed to assess resolution of inflammation.  Usually improvement in imaging is 6 to 8 weeks after insult. - Order repeat chest CT in 6-8 weeks from the last CT scan (end of November). - Continue albuterol  inhaler as needed. - Consider bronchoscopy if symptoms persist or worsen after CT scan.   Tobacco use disorder Tobacco use disorder with reduced smoking from three packs a day to half a pack. Smoking cessation critical due to lung inflammation. Discussed risks of continued smoking and benefits of cessation. Explored pharmacological options for smoking cessation, including Wellbutrin and nicotine   patches, but she is not ready to quit completely at this time. - Advise complete smoking cessation for at least the next two months. - Discuss Wellbutrin and nicotine  patch as options for smoking  cessation if she decides to quit in the future.   Suspected chronic obstructive pulmonary disease (COPD) Suspected COPD, but PFTs deferred until after recovery from current lung inflammation. Current lung inflammation may affect PFT results. - Plan to order PFTs in approximately three months, after resolution of current lung inflammation.   Smoking/Tobacco Cessation Counseling Murray Paone is a current user of tobacco or nicotine  products. She is not ready to quit at this time. Counseling provided today addressed the risks of continued use and the benefits of cessation. Discussed tobacco/nicotine  use history, readiness to quit, and evidence-based treatment options including behavioral strategies, support resources, and pharmacologic therapies. Provided encouragement and educational materials on steps and resources to quit smoking. Patient questions were addressed, and follow-up recommended for continued support. Total time spent on counseling: 4 minutes.       Zola Herter, MD [facility] Office: [phone]

## 2024-05-06 ENCOUNTER — Other Ambulatory Visit

## 2024-05-11 ENCOUNTER — Ambulatory Visit
Admission: RE | Admit: 2024-05-11 | Discharge: 2024-05-11 | Disposition: A | Discharge status: Home / Self Care | Source: Ambulatory Visit

## 2024-05-11 DIAGNOSIS — R0602 Shortness of breath: Secondary | ICD-10-CM

## 2024-05-11 DIAGNOSIS — R9389 Abnormal findings on diagnostic imaging of other specified body structures: Secondary | ICD-10-CM

## 2024-06-20 ENCOUNTER — Ambulatory Visit
